# Patient Record
Sex: Female | Born: 1990 | Race: Black or African American | Hispanic: No | Marital: Married | Smoking: Never smoker
Health system: Southern US, Community
[De-identification: ages and names within clinical notes are randomized; demographics above are authoritative.]

## PROBLEM LIST (undated history)

## (undated) ENCOUNTER — Inpatient Hospital Stay (HOSPITAL_COMMUNITY): Payer: Self-pay

## (undated) DIAGNOSIS — R51 Headache: Secondary | ICD-10-CM

## (undated) DIAGNOSIS — F329 Major depressive disorder, single episode, unspecified: Secondary | ICD-10-CM

## (undated) DIAGNOSIS — F32A Depression, unspecified: Secondary | ICD-10-CM

## (undated) DIAGNOSIS — Z1612 Extended spectrum beta lactamase (ESBL) resistance: Secondary | ICD-10-CM

## (undated) HISTORY — PX: NO PAST SURGERIES: SHX2092

---

## 2011-05-10 ENCOUNTER — Encounter (HOSPITAL_COMMUNITY): Payer: Self-pay | Admitting: *Deleted

## 2011-05-10 ENCOUNTER — Inpatient Hospital Stay (HOSPITAL_COMMUNITY): Payer: Self-pay

## 2011-05-10 ENCOUNTER — Inpatient Hospital Stay (HOSPITAL_COMMUNITY)
Admission: AD | Admit: 2011-05-10 | Discharge: 2011-05-10 | Disposition: A | Discharge status: Home / Self Care | Payer: Self-pay | Source: Ambulatory Visit | Attending: Obstetrics and Gynecology | Admitting: Obstetrics and Gynecology

## 2011-05-10 DIAGNOSIS — Z1389 Encounter for screening for other disorder: Secondary | ICD-10-CM

## 2011-05-10 DIAGNOSIS — Z363 Encounter for antenatal screening for malformations: Secondary | ICD-10-CM | POA: Insufficient documentation

## 2011-05-10 DIAGNOSIS — O219 Vomiting of pregnancy, unspecified: Secondary | ICD-10-CM | POA: Insufficient documentation

## 2011-05-10 DIAGNOSIS — Z3490 Encounter for supervision of normal pregnancy, unspecified, unspecified trimester: Secondary | ICD-10-CM

## 2011-05-10 LAB — CBC
HCT: 32.3 % — ABNORMAL LOW (ref 36.0–46.0)
Hemoglobin: 10.2 g/dL — ABNORMAL LOW (ref 12.0–15.0)
MCH: 26.4 pg (ref 26.0–34.0)
MCHC: 31.6 g/dL (ref 30.0–36.0)

## 2011-05-10 LAB — URINALYSIS, ROUTINE W REFLEX MICROSCOPIC
Bilirubin Urine: NEGATIVE
Glucose, UA: NEGATIVE mg/dL
Hgb urine dipstick: NEGATIVE
Ketones, ur: 40 mg/dL — AB
Leukocytes, UA: NEGATIVE
pH: 6 (ref 5.0–8.0)

## 2011-05-10 LAB — HCG, QUANTITATIVE, PREGNANCY: hCG, Beta Chain, Quant, S: 105605 m[IU]/mL — ABNORMAL HIGH (ref <5)

## 2011-05-10 LAB — WET PREP, GENITAL
Trich, Wet Prep: NONE SEEN
Yeast Wet Prep HPF POC: NONE SEEN

## 2011-05-10 IMAGING — US US OB COMP LESS 14 WK
1 series · 14 of 19 positions shown · non-contrast
Comparison: none

[Series 1: us ob comp less 14 wks · 14 of 19 slices shown]
[im 1/19]
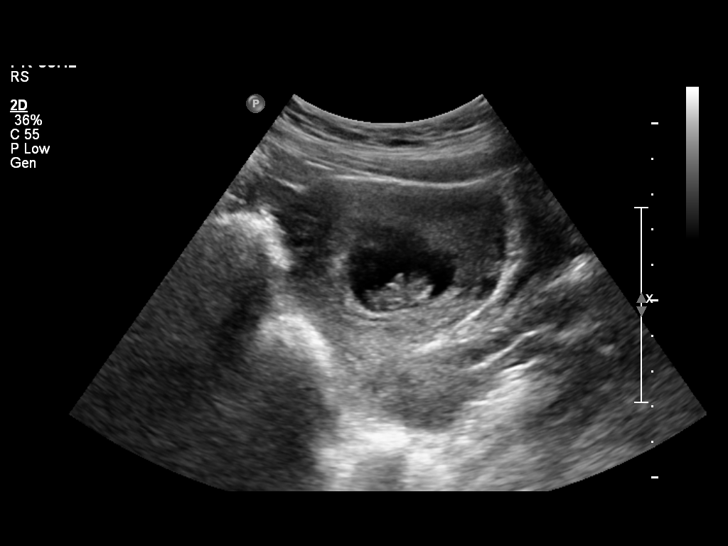
[im 3/19]
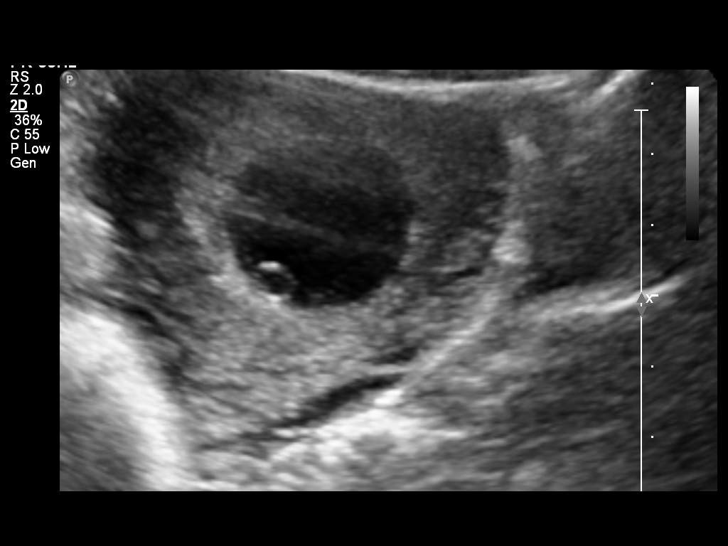
[im 4/19]
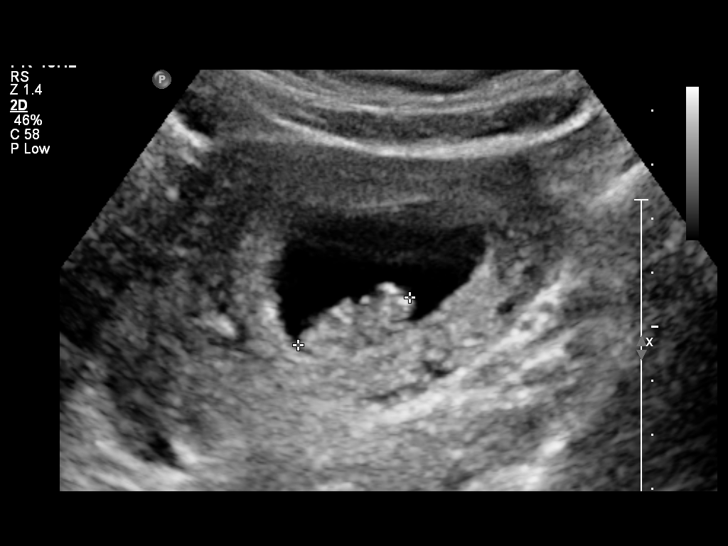
[im 5/19]
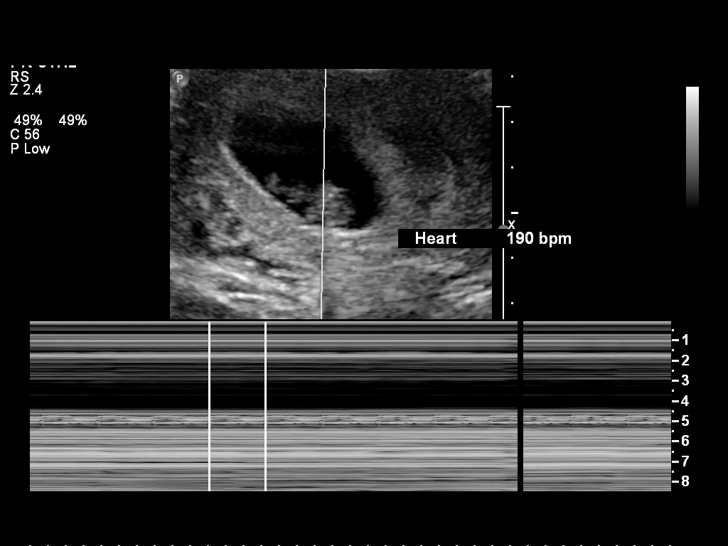
[im 7/19]
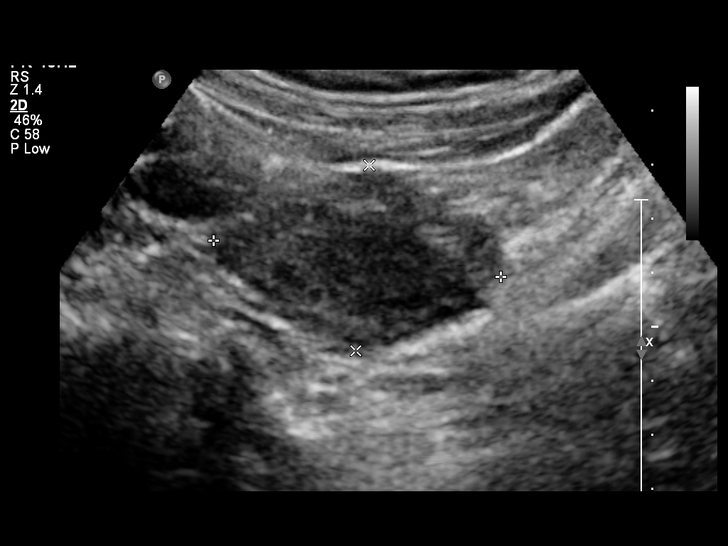
[im 8/19]
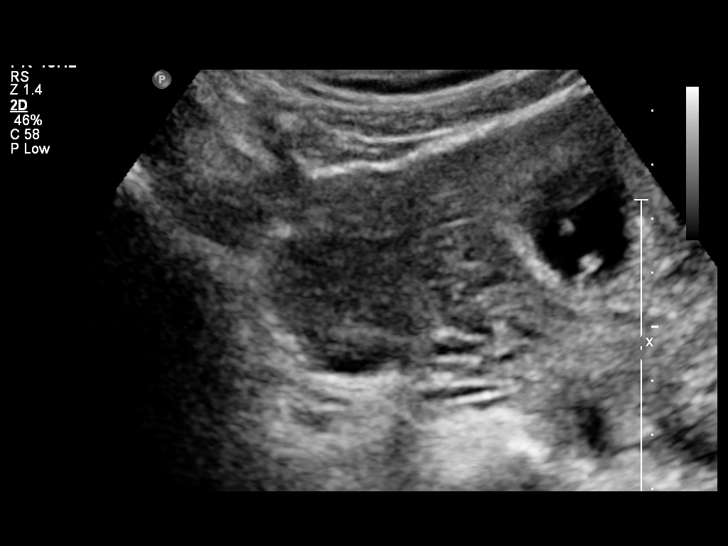
[im 9/19]
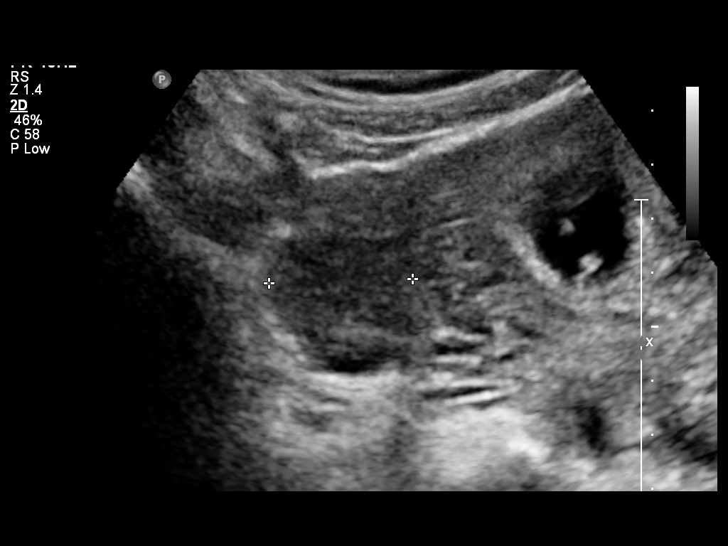
[im 11/19]
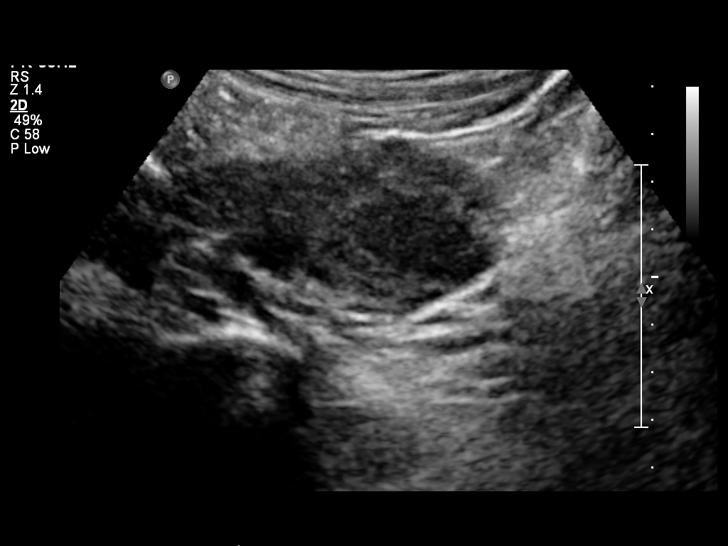
[im 12/19]
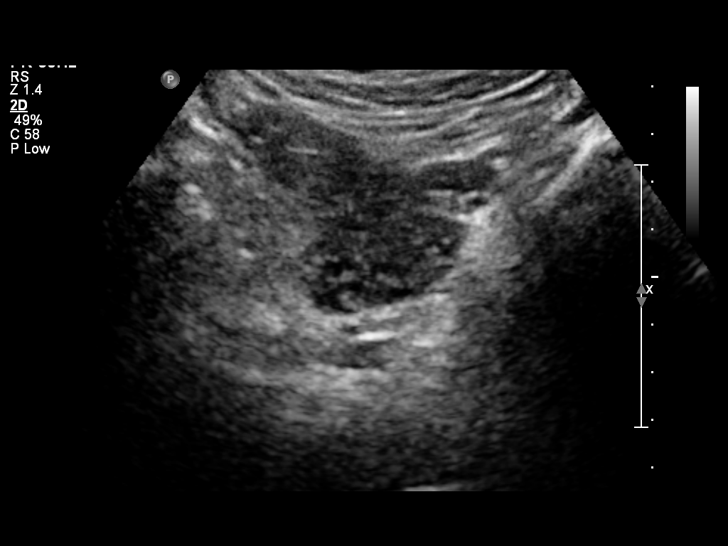
[im 13/19]
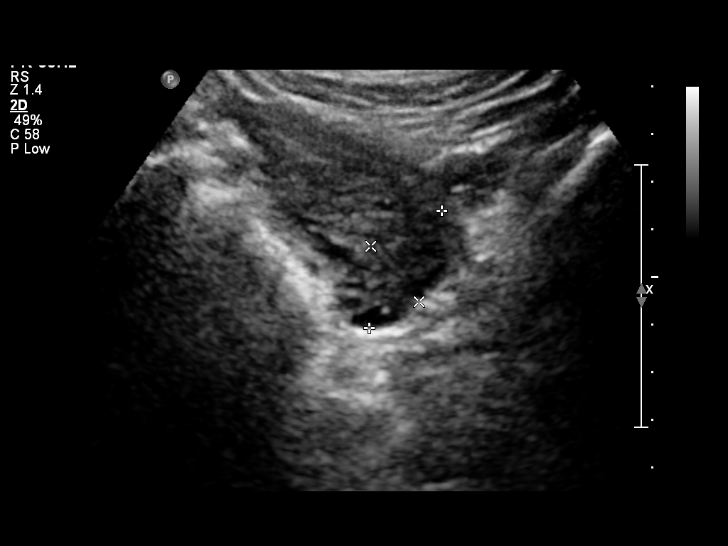
[im 15/19]
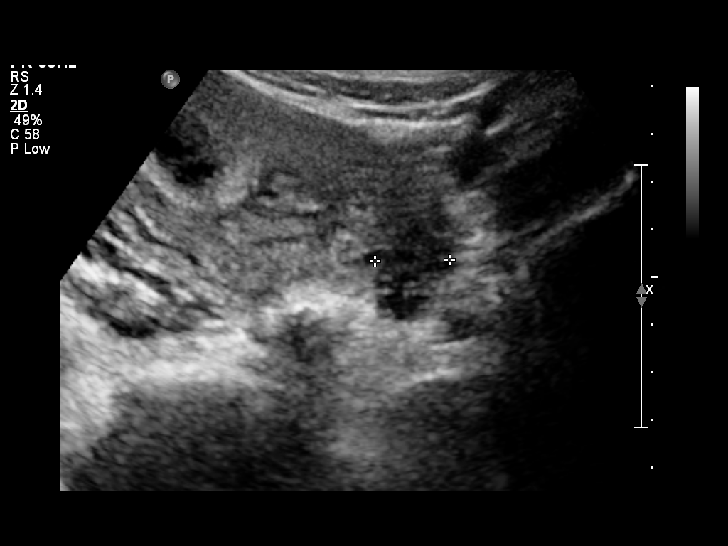
[im 16/19]
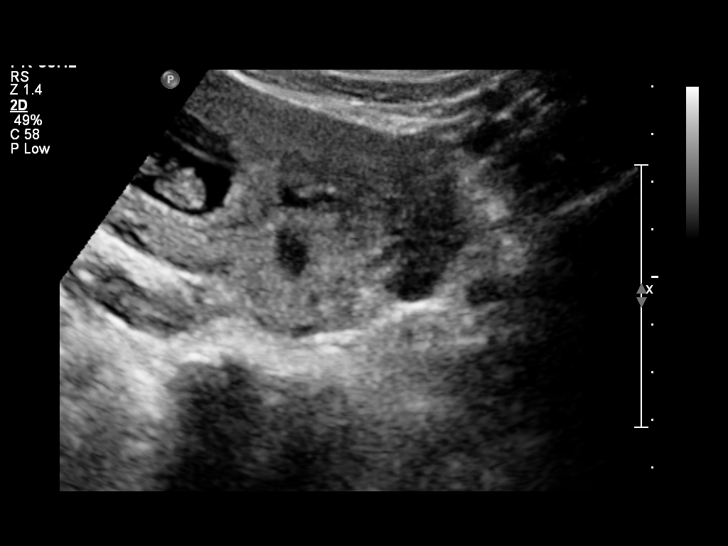
[im 17/19]
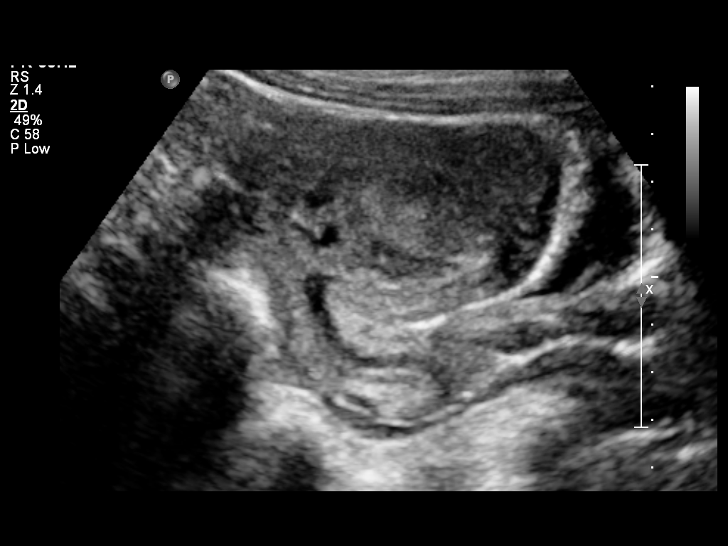
[im 19/19]
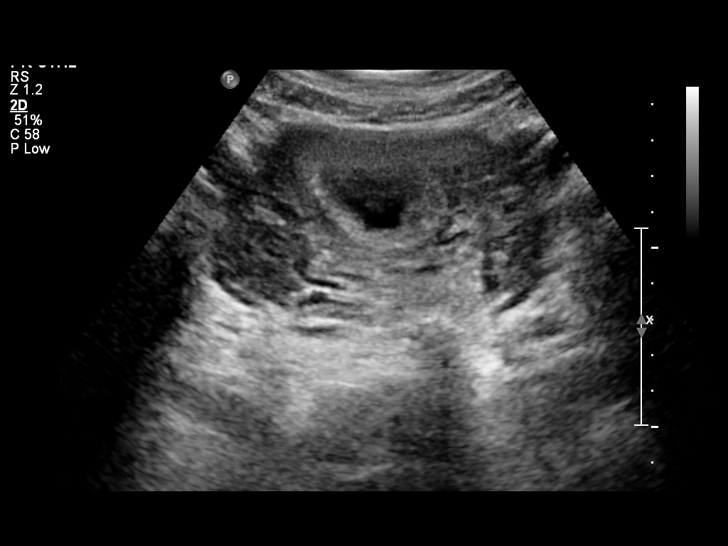

[14 of 19 positions shown; findings below may reference images not displayed]

OBSTETRICS REPORT
                      (Signed Final [DATE] [DATE])

Procedures

 US OB COMP LESS 14 WKS                                76801.0
Indications

 Pain - Abdominal/Pelvic
Fetal Evaluation

 Preg. Location:    Intrauterine
 Gest. Sac:         Intrauterine
 Yolk Sac:          Visualized
 Fetal Pole:        Visualized, intrauterine
 Fetal Heart Rate:  190                          bpm
 Cardiac Activity:  Observed

 Amniotic Fluid
 AFI FV:      Subjectively within normal limits
Biometry

 CRL:     22.3  mm     G. Age:  8w 5d                  EDD:    [DATE]
Gestational Age

 LMP:           8w 1d         Date:  [DATE]                 EDD:   [DATE]
 Best:          8w 1d      Det. By:  LMP  ([DATE])          EDD:   [DATE]
Cervix Uterus Adnexa

 Cervix:       Closed.
 Left Ovary:    Within normal limits.
 Right Ovary:   Within normal limits. Small corpus luteum noted.
 Adnexa:     No abnormality visualized.
Impression

 Intrauterine gestational sac, yolk sac, fetal pole, and cardiac
 activity noted.  Concordant GA by CRL and assigned GA by
 LMP.
 No acute abnormality.

## 2011-05-10 MED ORDER — ONDANSETRON HCL 4 MG/2ML IJ SOLN
4.0000 mg | Freq: Once | INTRAMUSCULAR | Status: AC
  Administered 2011-05-10: 4 mg via INTRAVENOUS
  Filled 2011-05-10: qty 2

## 2011-05-10 MED ORDER — DEXTROSE 5 % IN LACTATED RINGERS IV BOLUS
1000.0000 mL | Freq: Once | INTRAVENOUS | Status: AC
  Administered 2011-05-10: 1000 mL via INTRAVENOUS

## 2011-05-10 MED ORDER — ONDANSETRON HCL 4 MG PO TABS: 4.0000 mg | ORAL_TABLET | Freq: Three times a day (TID) | ORAL | Status: AC | PRN

## 2011-05-10 NOTE — ED Provider Notes (Signed)
History     Chief Complaint  Patient presents with  . Abdominal Pain   HPI 20 y.o.G1P0 at  Unknown EGA. C/O low abd pain x 1 month, worse for the last few days. No vaginal bleeding. + nausea and vomiting, unable to eat normally. Has also been fasting during the day, unable to eat at night when her fasting ends.     Past Medical History  Diagnosis Date  . No pertinent past medical history     Past Surgical History  Procedure Date  . No past surgeries     No family history on file.  History  Substance Use Topics  . Smoking status: Never Smoker   . Smokeless tobacco: Not on file  . Alcohol Use: No    Allergies:  Allergies  Allergen Reactions  . Fish Allergy Other (See Comments)    unknown    Prescriptions prior to admission  Medication Sig Dispense Refill  . Ibuprofen (ADVIL PO) Take 1 tablet by mouth daily.        . prenatal vitamin w/FE, FA (PRENATAL 1 + 1) 27-1 MG TABS Take 1 tablet by mouth daily.          Review of Systems  Constitutional: Negative.   HENT: Negative.   Eyes: Positive for pain.  Respiratory: Negative.   Cardiovascular: Negative.   Gastrointestinal: Positive for nausea, vomiting and abdominal pain.  Genitourinary: Negative.   Musculoskeletal: Negative.   Neurological: Negative.   Psychiatric/Behavioral: Negative.    Physical Exam   Blood pressure 114/75, pulse 99, resp. rate 18, height 5\' 4"  (1.626 m), weight 52.617 kg (116 lb), last menstrual period 03/14/2011.  Physical Exam  Nursing note and vitals reviewed. Constitutional: She is oriented to person, place, and time. She appears well-developed and well-nourished. No distress.  HENT:  Head: Normocephalic and atraumatic.  Cardiovascular: Normal rate.   Respiratory: Effort normal.  GI: Soft. Bowel sounds are normal. She exhibits no mass. There is no tenderness. There is no rebound and no guarding.  Genitourinary: There is no rash or lesion on the right labia. There is no rash or  lesion on the left labia. Uterus is not tender. Enlarged: Size c/w dates. Cervix exhibits no motion tenderness, no discharge and no friability. Right adnexum displays no mass, no tenderness and no fullness. Left adnexum displays no mass, no tenderness and no fullness. No tenderness or bleeding around the vagina. No vaginal discharge found.  Musculoskeletal: Normal range of motion.  Neurological: She is alert and oriented to person, place, and time.  Skin: Skin is warm and dry.  Psychiatric: She has a normal mood and affect.    MAU Course  Procedures  U/S shows 8.1 wk IUP, +fhr  Results for orders placed during the hospital encounter of 05/10/11 (from the past 24 hour(s))  URINALYSIS, ROUTINE W REFLEX MICROSCOPIC     Status: Abnormal   Collection Time   05/10/11  6:00 AM      Component Value Range   Color, Urine YELLOW  YELLOW    Appearance CLEAR  CLEAR    Specific Gravity, Urine >1.030 (*) 1.005 - 1.030    pH 6.0  5.0 - 8.0    Glucose, UA NEGATIVE  NEGATIVE (mg/dL)   Hgb urine dipstick NEGATIVE  NEGATIVE    Bilirubin Urine NEGATIVE  NEGATIVE    Ketones, ur 40 (*) NEGATIVE (mg/dL)   Protein, ur NEGATIVE  NEGATIVE (mg/dL)   Urobilinogen, UA 0.2  0.0 - 1.0 (mg/dL)  Nitrite NEGATIVE  NEGATIVE    Leukocytes, UA NEGATIVE  NEGATIVE   CBC     Status: Abnormal   Collection Time   05/10/11  6:50 AM      Component Value Range   WBC 7.2  4.0 - 10.5 (K/uL)   RBC 3.87  3.87 - 5.11 (MIL/uL)   Hemoglobin 10.2 (*) 12.0 - 15.0 (g/dL)   HCT 82.9 (*) 56.2 - 46.0 (%)   MCV 83.5  78.0 - 100.0 (fL)   MCH 26.4  26.0 - 34.0 (pg)   MCHC 31.6  30.0 - 36.0 (g/dL)   RDW 13.0  86.5 - 78.4 (%)   Platelets 197  150 - 400 (K/uL)  ABO/RH     Status: Normal   Collection Time   05/10/11  6:50 AM      Component Value Range   ABO/RH(D) A POS    HCG, QUANTITATIVE, PREGNANCY     Status: Abnormal   Collection Time   05/10/11  6:50 AM      Component Value Range   hCG, Beta Chain, Mahalia Longest [id number] (*) <5 (mIU/mL)   WET PREP, GENITAL     Status: Normal   Collection Time   05/10/11  7:05 AM      Component Value Range   Yeast, Wet Prep NONE SEEN  NONE SEEN    Trich, Wet Prep NONE SEEN  NONE SEEN    Clue Cells, Wet Prep NONE SEEN  NONE SEEN    WBC, Wet Prep HPF POC NONE SEEN  NONE SEEN       Assessment and Plan  1) 20 yo G1P0 @ 8.1 wk IUP - FU as scheduled tomorrow at [facility] 2) N/V of pregnancy - IV hydration and Zofran, recommend cessation of fasting or modified fasting for Ramadan, Rx Zofran 4mg  po prn nausea 3) Allergy Eyes: Recommend OTC Saline or Visine Allergy.      FRAZIER,NATALIE 05/10/2011, 6:49 AM

## 2011-05-10 NOTE — ED Provider Notes (Signed)
Pt back from [place], stable, NAD [place] results reviewed showing viable IUP at [redacted]w[redacted]d; EDD 12/311  Will give 1 liter LR with Zofran now Anticipate d/c home after IVF complete.

## 2011-05-10 NOTE — ED Notes (Signed)
Pt returned from [place]

## 2011-05-10 NOTE — Progress Notes (Signed)
Pt reports pain in lower abd x 1 month, was seen at health dept and told she was pregnant and she should come here to be seen. LMP 03/14/2011. Vomiting , nauseated all the time. Denies vaginal bleeding.

## 2011-05-21 ENCOUNTER — Encounter (HOSPITAL_COMMUNITY): Payer: Self-pay | Admitting: *Deleted

## 2011-05-21 ENCOUNTER — Inpatient Hospital Stay (HOSPITAL_COMMUNITY)
Admission: AD | Admit: 2011-05-21 | Discharge: 2011-05-21 | Disposition: A | Discharge status: Home / Self Care | Payer: Self-pay | Source: Ambulatory Visit | Attending: Obstetrics and Gynecology | Admitting: Obstetrics and Gynecology

## 2011-05-21 DIAGNOSIS — O2390 Unspecified genitourinary tract infection in pregnancy, unspecified trimester: Secondary | ICD-10-CM | POA: Insufficient documentation

## 2011-05-21 DIAGNOSIS — O210 Mild hyperemesis gravidarum: Secondary | ICD-10-CM | POA: Insufficient documentation

## 2011-05-21 DIAGNOSIS — N390 Urinary tract infection, site not specified: Secondary | ICD-10-CM | POA: Insufficient documentation

## 2011-05-21 DIAGNOSIS — O219 Vomiting of pregnancy, unspecified: Secondary | ICD-10-CM

## 2011-05-21 LAB — URINE MICROSCOPIC-ADD ON

## 2011-05-21 LAB — URINALYSIS, ROUTINE W REFLEX MICROSCOPIC
Glucose, UA: NEGATIVE mg/dL
Ketones, ur: NEGATIVE mg/dL
Protein, ur: 100 mg/dL — AB

## 2011-05-21 MED ORDER — CEPHALEXIN 500 MG PO CAPS: 500.0000 mg | ORAL_CAPSULE | Freq: Four times a day (QID) | ORAL | Status: AC

## 2011-05-21 MED ORDER — ONDANSETRON 4 MG PO TBDP: 4.0000 mg | ORAL_TABLET | Freq: Three times a day (TID) | ORAL | Status: AC | PRN

## 2011-05-21 NOTE — ED Notes (Signed)
Lab aware of urine culture.

## 2011-05-21 NOTE — Progress Notes (Signed)
Unable to get [employer] with [employer]. Husband will translate. Pt states she has been having bilateral eye pain and headache for a while. Started having nausea and vomiting 8-2.

## 2011-05-21 NOTE — ED Provider Notes (Signed)
S:   Catherine Gomez is a 20 y.o. G1P0 at [redacted]w[redacted]d presenting with intermittent bil eye pain and headaches since May. She has had the H/A and eye pain continuously  for the past 3 days since she ran out of Zofran, has been vomiting and has not been eating or drinking much. When she took the Zofran and was able to retain her food she had no H/A or eye pain. She denies photophobia, tearing, drainage or visual disturbance. No URI sx. She also reports some dysuria and low back pain for the past week. Denies frequency, urgency, hematuria, fever/chills. Waiting for [facility] to start [facility] at [facility]. She is from [place] and interpreter is her husband. Had a prior visit here for N/V and dehydration.  O: Filed Vitals:   05/21/11 1234  BP: 119/75  Pulse: 84  Temp: 97.6 F (36.4 C)  Resp: 16   Results for orders placed during the hospital encounter of 05/21/11 (from the past 24 hour(s))  URINALYSIS, ROUTINE W REFLEX MICROSCOPIC     Status: Abnormal   Collection Time   05/21/11 12:40 PM      Component Value Range   Color, Urine YELLOW  YELLOW    Appearance CLOUDY (*) CLEAR    Specific Gravity, Urine 1.025  1.005 - 1.030    pH 7.0  5.0 - 8.0    Glucose, UA NEGATIVE  NEGATIVE (mg/dL)   Hgb urine dipstick MODERATE (*) NEGATIVE    Bilirubin Urine NEGATIVE  NEGATIVE    Ketones, ur NEGATIVE  NEGATIVE (mg/dL)   Protein, ur 161 (*) NEGATIVE (mg/dL)   Urobilinogen, UA 0.2  0.0 - 1.0 (mg/dL)   Nitrite NEGATIVE  NEGATIVE    Leukocytes, UA LARGE (*) NEGATIVE   URINE MICROSCOPIC-ADD ON     Status: Abnormal   Collection Time   05/21/11 12:40 PM      Component Value Range   Squamous Epithelial / LPF MANY (*) RARE    WBC, UA TOO NUMEROUS TO COUNT  <3 (WBC/hpf)   RBC / HPF 7-10  <3 (RBC/hpf)   Bacteria, UA RARE  RARE    Urine-Other MUCOUS PRESENT     NAD.  Head: NT over maxillary sinuses Eyes: normal appearance, PERRLA, EOMs intact Back: no CVAT Abd: soft, NT; FHR 170 by DT  A/P: N/V of pregnancy with headache and  eye sx likely due to dehydration --treat with Zofran Presumptive UTI -- C&S sent and will tx with Keflex

## 2011-05-21 NOTE — ED Notes (Signed)
C/o headache and pain in her eyes.  No burning, no excessive tearing..  Repeats eyes hurt

## 2011-05-24 ENCOUNTER — Telehealth: Payer: Self-pay | Admitting: Family Medicine

## 2011-05-24 ENCOUNTER — Telehealth: Payer: Self-pay | Admitting: Obstetrics and Gynecology

## 2011-05-24 DIAGNOSIS — O2340 Unspecified infection of urinary tract in pregnancy, unspecified trimester: Secondary | ICD-10-CM

## 2011-05-24 LAB — URINE CULTURE

## 2011-05-24 NOTE — Telephone Encounter (Signed)
Called and left a message for patient.  Her UTI is not susceptible to Keflex.  Per discussion with Dr. Debroah Loop, she will need to be changed to [facility] and a single IM dose of gentamycin with a followup culture in ~2 weeks.

## 2011-05-26 NOTE — ED Provider Notes (Signed)
Agree with above note.  Catherine Gomez 05/26/2011 4:43 PM

## 2011-06-02 NOTE — Telephone Encounter (Signed)
Called pt. See Telephone note.

## 2011-07-14 LAB — RPR: RPR: NONREACTIVE

## 2011-07-14 LAB — HEPATITIS B SURFACE ANTIGEN: Hepatitis B Surface Ag: NEGATIVE

## 2011-10-19 NOTE — L&D Delivery Note (Signed)
Delivery Note At 4:25 AM a viable female was delivered via Vaginal, Vacuum (Extractor) (Presentation: ; Occiput Anterior).  APGAR: 8, 9; weight 6 lb 10.5 oz (3020 g).   Placenta status: Intact, Spontaneous.  Cord: 3 vessels with the following complications: None.  Cord pH: not done  Anesthesia: Epidural  Episiotomy: None Lacerations: None Suture Repair: 2.0 Est. Blood Loss (mL):   Mom to postpartum.  Baby to nursery-stable.  Maddalynn Barnard A 12/17/2011, 5:00 AM

## 2011-11-18 ENCOUNTER — Other Ambulatory Visit: Payer: Self-pay

## 2011-12-02 ENCOUNTER — Other Ambulatory Visit (HOSPITAL_COMMUNITY): Payer: Self-pay | Admitting: Obstetrics

## 2011-12-02 DIAGNOSIS — Z3689 Encounter for other specified antenatal screening: Secondary | ICD-10-CM

## 2011-12-02 DIAGNOSIS — O2690 Pregnancy related conditions, unspecified, unspecified trimester: Secondary | ICD-10-CM

## 2011-12-07 ENCOUNTER — Ambulatory Visit (HOSPITAL_COMMUNITY)
Admission: RE | Admit: 2011-12-07 | Discharge: 2011-12-07 | Disposition: A | Discharge status: Home / Self Care | Payer: Medicaid Other | Source: Ambulatory Visit | Attending: Obstetrics | Admitting: Obstetrics

## 2011-12-07 ENCOUNTER — Encounter (HOSPITAL_COMMUNITY): Payer: Self-pay

## 2011-12-07 DIAGNOSIS — Z1389 Encounter for screening for other disorder: Secondary | ICD-10-CM | POA: Insufficient documentation

## 2011-12-07 DIAGNOSIS — Z3689 Encounter for other specified antenatal screening: Secondary | ICD-10-CM

## 2011-12-07 DIAGNOSIS — O358XX0 Maternal care for other (suspected) fetal abnormality and damage, not applicable or unspecified: Secondary | ICD-10-CM | POA: Insufficient documentation

## 2011-12-07 DIAGNOSIS — Z363 Encounter for antenatal screening for malformations: Secondary | ICD-10-CM | POA: Insufficient documentation

## 2011-12-07 DIAGNOSIS — O2690 Pregnancy related conditions, unspecified, unspecified trimester: Secondary | ICD-10-CM

## 2011-12-16 ENCOUNTER — Encounter (HOSPITAL_COMMUNITY): Payer: Self-pay | Admitting: Anesthesiology

## 2011-12-16 ENCOUNTER — Encounter (HOSPITAL_COMMUNITY): Payer: Self-pay | Admitting: *Deleted

## 2011-12-16 ENCOUNTER — Inpatient Hospital Stay (HOSPITAL_COMMUNITY): Payer: Medicaid Other | Admitting: Anesthesiology

## 2011-12-16 ENCOUNTER — Inpatient Hospital Stay (HOSPITAL_COMMUNITY)
Admission: AD | Admit: 2011-12-16 | Discharge: 2011-12-19 | DRG: 774 | Disposition: A | Discharge status: Home Health | Payer: Medicaid Other | Source: Ambulatory Visit | Attending: Obstetrics | Admitting: Obstetrics

## 2011-12-16 DIAGNOSIS — D649 Anemia, unspecified: Secondary | ICD-10-CM | POA: Diagnosis not present

## 2011-12-16 DIAGNOSIS — IMO0002 Reserved for concepts with insufficient information to code with codable children: Secondary | ICD-10-CM | POA: Diagnosis present

## 2011-12-16 DIAGNOSIS — O9903 Anemia complicating the puerperium: Secondary | ICD-10-CM | POA: Diagnosis not present

## 2011-12-16 DIAGNOSIS — O1410 Severe pre-eclampsia, unspecified trimester: Secondary | ICD-10-CM | POA: Clinically undetermined

## 2011-12-16 LAB — COMPREHENSIVE METABOLIC PANEL
ALT: 8 U/L (ref 0–35)
BUN: 8 mg/dL (ref 6–23)
CO2: 25 mEq/L (ref 19–32)
Calcium: 8.9 mg/dL (ref 8.4–10.5)
Creatinine, Ser: 0.67 mg/dL (ref 0.50–1.10)
GFR calc Af Amer: >90 mL/min (ref >90)
GFR calc non Af Amer: >90 mL/min (ref >90)
Glucose, Bld: 68 mg/dL — ABNORMAL LOW (ref 70–99)
Sodium: 136 mEq/L (ref 135–145)
Total Protein: 6.1 g/dL (ref 6.0–8.3)

## 2011-12-16 LAB — RPR: RPR Ser Ql: NONREACTIVE

## 2011-12-16 LAB — CBC
Hemoglobin: 9.6 g/dL — ABNORMAL LOW (ref 12.0–15.0)
Hemoglobin: 9.3 g/dL — ABNORMAL LOW (ref 12.0–15.0)
MCH: 25.3 pg — ABNORMAL LOW (ref 26.0–34.0)
MCH: 25.8 pg — ABNORMAL LOW (ref 26.0–34.0)
MCHC: 31.1 g/dL (ref 30.0–36.0)
MCV: 81.5 fL (ref 78.0–100.0)
Platelets: 91 10*3/uL — ABNORMAL LOW (ref 150–400)
RBC: 3.61 MIL/uL — ABNORMAL LOW (ref 3.87–5.11)

## 2011-12-16 LAB — URIC ACID: Uric Acid, Serum: 6.5 mg/dL (ref 2.4–7.0)

## 2011-12-16 MED ORDER — TERBUTALINE SULFATE 1 MG/ML IJ SOLN: 0.2500 mg | Freq: Once | INTRAMUSCULAR | Status: AC | PRN

## 2011-12-16 MED ORDER — LIDOCAINE HCL (PF) 1 % IJ SOLN
30.0000 mL | INTRAMUSCULAR | Status: DC | PRN
  Filled 2011-12-16: qty 30

## 2011-12-16 MED ORDER — LACTATED RINGERS IV SOLN
500.0000 mL | INTRAVENOUS | Status: DC | PRN
  Administered 2011-12-16: 300 mL via INTRAVENOUS
  Administered 2011-12-16: 500 mL via INTRAVENOUS

## 2011-12-16 MED ORDER — LIDOCAINE HCL 1.5 % IJ SOLN
INTRAMUSCULAR | Status: DC | PRN
  Administered 2011-12-16 (×2): 5 mL via EPIDURAL

## 2011-12-16 MED ORDER — BUTORPHANOL TARTRATE 2 MG/ML IJ SOLN
1.0000 mg | INTRAMUSCULAR | Status: DC | PRN
  Administered 2011-12-16: 1 mg via INTRAVENOUS
  Filled 2011-12-16: qty 1

## 2011-12-16 MED ORDER — FLEET ENEMA 7-19 GM/118ML RE ENEM: 1.0000 | ENEMA | RECTAL | Status: DC | PRN

## 2011-12-16 MED ORDER — MAGNESIUM SULFATE BOLUS VIA INFUSION
4.0000 g | Freq: Once | INTRAVENOUS | Status: DC
  Filled 2011-12-16: qty 500

## 2011-12-16 MED ORDER — LACTATED RINGERS IV SOLN
INTRAVENOUS | Status: DC
  Administered 2011-12-16 (×3): via INTRAVENOUS

## 2011-12-16 MED ORDER — ONDANSETRON HCL 4 MG/2ML IJ SOLN
4.0000 mg | Freq: Four times a day (QID) | INTRAMUSCULAR | Status: DC | PRN
  Administered 2011-12-16: 4 mg via INTRAVENOUS
  Filled 2011-12-16: qty 2

## 2011-12-16 MED ORDER — PHENYLEPHRINE 40 MCG/ML (10ML) SYRINGE FOR IV PUSH (FOR BLOOD PRESSURE SUPPORT): 80.0000 ug | PREFILLED_SYRINGE | INTRAVENOUS | Status: DC | PRN

## 2011-12-16 MED ORDER — MAGNESIUM SULFATE 40 G IN LACTATED RINGERS - SIMPLE
2.0000 g/h | INTRAVENOUS | Status: DC
  Administered 2011-12-16: 4 g/h via INTRAVENOUS
  Administered 2011-12-17: 2 g/h via INTRAVENOUS
  Filled 2011-12-16 (×2): qty 500

## 2011-12-16 MED ORDER — LACTATED RINGERS IV SOLN: 500.0000 mL | Freq: Once | INTRAVENOUS | Status: DC

## 2011-12-16 MED ORDER — OXYTOCIN 20 UNITS IN LACTATED RINGERS INFUSION - SIMPLE
1.0000 m[IU]/min | INTRAVENOUS | Status: DC
  Administered 2011-12-16: 1 m[IU]/min via INTRAVENOUS
  Filled 2011-12-16: qty 1000

## 2011-12-16 MED ORDER — ACETAMINOPHEN 325 MG PO TABS
650.0000 mg | ORAL_TABLET | ORAL | Status: DC | PRN
  Administered 2011-12-16: 650 mg via ORAL
  Filled 2011-12-16: qty 2

## 2011-12-16 MED ORDER — EPHEDRINE 5 MG/ML INJ
10.0000 mg | INTRAVENOUS | Status: DC | PRN
  Filled 2011-12-16: qty 4

## 2011-12-16 MED ORDER — CITRIC ACID-SODIUM CITRATE 334-500 MG/5ML PO SOLN
30.0000 mL | ORAL | Status: DC | PRN
  Administered 2011-12-16: 30 mL via ORAL
  Filled 2011-12-16: qty 15

## 2011-12-16 MED ORDER — IBUPROFEN 600 MG PO TABS
600.0000 mg | ORAL_TABLET | Freq: Four times a day (QID) | ORAL | Status: DC | PRN
  Administered 2011-12-17: 600 mg via ORAL

## 2011-12-16 MED ORDER — DIPHENHYDRAMINE HCL 50 MG/ML IJ SOLN: 12.5000 mg | INTRAMUSCULAR | Status: DC | PRN

## 2011-12-16 MED ORDER — EPHEDRINE 5 MG/ML INJ: 10.0000 mg | INTRAVENOUS | Status: DC | PRN

## 2011-12-16 MED ORDER — OXYTOCIN 20 UNITS IN LACTATED RINGERS INFUSION - SIMPLE: 125.0000 mL/h | Freq: Once | INTRAVENOUS | Status: DC

## 2011-12-16 MED ORDER — OXYTOCIN BOLUS FROM INFUSION
500.0000 mL | Freq: Once | INTRAVENOUS | Status: AC
  Administered 2011-12-17: 500 mL via INTRAVENOUS
  Filled 2011-12-16: qty 500

## 2011-12-16 MED ORDER — FENTANYL 2.5 MCG/ML BUPIVACAINE 1/10 % EPIDURAL INFUSION (WH - ANES)
INTRAMUSCULAR | Status: DC | PRN
  Administered 2011-12-16: 14 mL/h via EPIDURAL

## 2011-12-16 MED ORDER — FENTANYL 2.5 MCG/ML BUPIVACAINE 1/10 % EPIDURAL INFUSION (WH - ANES)
14.0000 mL/h | INTRAMUSCULAR | Status: DC
Start: 2011-12-16 — End: 2011-12-17
  Administered 2011-12-16 – 2011-12-17 (×2): 14 mL/h via EPIDURAL
  Filled 2011-12-16 (×3): qty 60

## 2011-12-16 MED ORDER — PHENYLEPHRINE 40 MCG/ML (10ML) SYRINGE FOR IV PUSH (FOR BLOOD PRESSURE SUPPORT)
80.0000 ug | PREFILLED_SYRINGE | INTRAVENOUS | Status: DC | PRN
  Filled 2011-12-16: qty 5

## 2011-12-16 MED ORDER — OXYCODONE-ACETAMINOPHEN 5-325 MG PO TABS
1.0000 | ORAL_TABLET | ORAL | Status: DC | PRN
  Administered 2011-12-19: 2 via ORAL
  Filled 2011-12-16 (×2): qty 1
  Filled 2011-12-16: qty 2
  Filled 2011-12-16: qty 1

## 2011-12-16 NOTE — Anesthesia Procedure Notes (Signed)
Epidural Patient location during procedure: OB Start time: 12/16/2011 7:08 PM End time: 12/16/2011 7:12 PM Reason for block: procedure for pain  Staffing Anesthesiologist: Sandrea Hughs Performed by: anesthesiologist   Preanesthetic Checklist Completed: patient identified, site marked, surgical consent, pre-op evaluation, timeout performed, IV checked, risks and benefits discussed and monitors and equipment checked  Epidural Patient position: sitting Prep: site prepped and draped and DuraPrep Patient monitoring: continuous pulse ox and blood pressure Approach: midline Injection technique: LOR air  Needle:  Needle type: Tuohy  Needle gauge: 17 G Needle length: 9 cm Needle insertion depth: 4 cm Catheter type: closed end flexible Catheter size: 19 Gauge Catheter at skin depth: 9 cm Test dose: negative and 1.5% lidocaine  Assessment Sensory level: T8 Events: blood not aspirated, injection not painful, no injection resistance, negative IV test and no paresthesia

## 2011-12-16 NOTE — Anesthesia Preprocedure Evaluation (Signed)

## 2011-12-17 ENCOUNTER — Encounter (HOSPITAL_COMMUNITY): Payer: Self-pay | Admitting: *Deleted

## 2011-12-17 LAB — MRSA PCR SCREENING: MRSA by PCR: NEGATIVE

## 2011-12-17 LAB — COMPREHENSIVE METABOLIC PANEL
ALT: 7 U/L (ref 0–35)
AST: 17 U/L (ref 0–37)
Alkaline Phosphatase: 200 U/L — ABNORMAL HIGH (ref 39–117)
CO2: 25 mEq/L (ref 19–32)
Calcium: 8.4 mg/dL (ref 8.4–10.5)
GFR calc non Af Amer: >90 mL/min (ref >90)
Glucose, Bld: 116 mg/dL — ABNORMAL HIGH (ref 70–99)
Potassium: 3.9 mEq/L (ref 3.5–5.1)
Sodium: 136 mEq/L (ref 135–145)
Total Protein: 5.7 g/dL — ABNORMAL LOW (ref 6.0–8.3)

## 2011-12-17 LAB — CBC
HCT: 30.4 % — ABNORMAL LOW (ref 36.0–46.0)
Hemoglobin: 9.5 g/dL — ABNORMAL LOW (ref 12.0–15.0)
MCH: 25.9 pg — ABNORMAL LOW (ref 26.0–34.0)
MCHC: 31.3 g/dL (ref 30.0–36.0)
RDW: 14.7 % (ref 11.5–15.5)

## 2011-12-17 MED ORDER — PRENATAL MULTIVITAMIN CH
1.0000 | ORAL_TABLET | Freq: Every day | ORAL | Status: DC
  Administered 2011-12-17 – 2011-12-19 (×3): 1 via ORAL
  Filled 2011-12-17 (×3): qty 1

## 2011-12-17 MED ORDER — SIMETHICONE 80 MG PO CHEW: 80.0000 mg | CHEWABLE_TABLET | ORAL | Status: DC | PRN

## 2011-12-17 MED ORDER — LACTATED RINGERS IV SOLN
INTRAVENOUS | Status: DC
  Administered 2011-12-17: 01:00:00 via INTRAUTERINE

## 2011-12-17 MED ORDER — FERROUS SULFATE 325 (65 FE) MG PO TABS
325.0000 mg | ORAL_TABLET | Freq: Two times a day (BID) | ORAL | Status: DC
  Administered 2011-12-17 – 2011-12-19 (×5): 325 mg via ORAL
  Filled 2011-12-17 (×5): qty 1

## 2011-12-17 MED ORDER — DIPHENHYDRAMINE HCL 25 MG PO CAPS: 25.0000 mg | ORAL_CAPSULE | Freq: Four times a day (QID) | ORAL | Status: DC | PRN

## 2011-12-17 MED ORDER — SENNOSIDES-DOCUSATE SODIUM 8.6-50 MG PO TABS
2.0000 | ORAL_TABLET | Freq: Every day | ORAL | Status: DC
  Administered 2011-12-17 – 2011-12-18 (×2): 2 via ORAL

## 2011-12-17 MED ORDER — DIBUCAINE 1 % RE OINT
1.0000 "application " | TOPICAL_OINTMENT | RECTAL | Status: DC | PRN
  Filled 2011-12-17: qty 28

## 2011-12-17 MED ORDER — OXYCODONE-ACETAMINOPHEN 5-325 MG PO TABS
1.0000 | ORAL_TABLET | ORAL | Status: DC | PRN
  Administered 2011-12-17 – 2011-12-18 (×3): 1 via ORAL

## 2011-12-17 MED ORDER — ONDANSETRON HCL 4 MG/2ML IJ SOLN: 4.0000 mg | INTRAMUSCULAR | Status: DC | PRN

## 2011-12-17 MED ORDER — LANOLIN HYDROUS EX OINT: TOPICAL_OINTMENT | CUTANEOUS | Status: DC | PRN

## 2011-12-17 MED ORDER — BENZOCAINE-MENTHOL 20-0.5 % EX AERO
1.0000 "application " | INHALATION_SPRAY | CUTANEOUS | Status: DC | PRN
  Filled 2011-12-17: qty 56

## 2011-12-17 MED ORDER — WITCH HAZEL-GLYCERIN EX PADS: 1.0000 "application " | MEDICATED_PAD | CUTANEOUS | Status: DC | PRN

## 2011-12-17 MED ORDER — IBUPROFEN 600 MG PO TABS
600.0000 mg | ORAL_TABLET | Freq: Four times a day (QID) | ORAL | Status: DC
  Administered 2011-12-17 – 2011-12-19 (×8): 600 mg via ORAL
  Filled 2011-12-17 (×9): qty 1

## 2011-12-17 MED ORDER — LACTATED RINGERS IV SOLN
INTRAVENOUS | Status: DC
  Administered 2011-12-17 – 2011-12-18 (×3): via INTRAVENOUS

## 2011-12-17 MED ORDER — ZOLPIDEM TARTRATE 5 MG PO TABS: 5.0000 mg | ORAL_TABLET | Freq: Every evening | ORAL | Status: DC | PRN

## 2011-12-17 MED ORDER — ONDANSETRON HCL 4 MG PO TABS: 4.0000 mg | ORAL_TABLET | ORAL | Status: DC | PRN

## 2011-12-17 MED ORDER — TETANUS-DIPHTH-ACELL PERTUSSIS 5-2.5-18.5 LF-MCG/0.5 IM SUSP
0.5000 mL | Freq: Once | INTRAMUSCULAR | Status: DC
  Filled 2011-12-17: qty 0.5

## 2011-12-17 NOTE — H&P (Signed)
And this is Dr. Francoise Ceo dictating the history and physical on  Catherine Gomez she's a 21 year old gravida 1 at 56 weeks and 5 days who was seen in the office on 228 at that time her blood pressure was 140/90 should've 5 pound weight gain and 4+ proteinuria cervix was 1 cm and she was admitted for delivery because of PIH at a time of admission her platelets were 99 down from 131 she was started on Pitocin and him and 6 hours after admission her platelets were 91 showed dry epidural placed her liver enzymes were normal and her highest diastolic was 100 membranes were ruptured artificially and the fluid was clear Past medical history negative Past surgical history negative Social history noncontributory Family history negative System review negative Physical exam well-developed female in no distress HEENT negative Heart regular rhythm no murmurs no gallops Breasts negative Abdomen term Pelvic as described above Extremities negative and

## 2011-12-17 NOTE — Progress Notes (Signed)
UR Chart review completed.  

## 2011-12-18 DIAGNOSIS — O1410 Severe pre-eclampsia, unspecified trimester: Secondary | ICD-10-CM | POA: Clinically undetermined

## 2011-12-18 LAB — CBC
MCHC: 31.7 g/dL (ref 30.0–36.0)
Platelets: 95 10*3/uL — ABNORMAL LOW (ref 150–400)
RDW: 14.9 % (ref 11.5–15.5)
WBC: 14.8 10*3/uL — ABNORMAL HIGH (ref 4.0–10.5)

## 2011-12-18 MED ORDER — ALUM & MAG HYDROXIDE-SIMETH 200-200-20 MG/5ML PO SUSP
30.0000 mL | Freq: Four times a day (QID) | ORAL | Status: DC | PRN
  Administered 2011-12-18 – 2011-12-19 (×3): 30 mL via ORAL
  Filled 2011-12-18 (×3): qty 30

## 2011-12-18 MED ORDER — SODIUM CHLORIDE 0.9 % IJ SOLN
3.0000 mL | Freq: Two times a day (BID) | INTRAMUSCULAR | Status: DC
  Administered 2011-12-18 (×2): 3 mL via INTRAVENOUS

## 2011-12-18 MED ORDER — SODIUM CHLORIDE 0.9 % IJ SOLN: 3.0000 mL | INTRAMUSCULAR | Status: DC | PRN

## 2011-12-18 NOTE — Progress Notes (Signed)
Patient ID: Catherine Gomez, female   DOB: 26-Oct-1990, 21 y.o.   MRN: [id number] Post Partum Day 1 S/P spontaneous vaginal RH status/Rubella reviewed.  Feeding: breast and bottle Subjective: No HA, SOB, CP, F/C, breast symptoms. Normal vaginal bleeding, no clots.     Objective: BP 140/95  Pulse 98  Temp(Src) 97.8 F (36.6 C) (Oral)  Resp 18  Ht 5\' 4"  (1.626 m)  Wt 63.957 kg (141 lb)  BMI 24.20 kg/m2  SpO2 100%  LMP 03/14/2011  Breastfeeding? Unknown  Intake/Output Summary (Last 24 hours) at 12/18/11 1144 Last data filed at 12/18/11 1100  Gross per 24 hour  Intake 4524.59 ml  Output   4550 ml  Net -25.41 ml    Physical Exam:  General: alert Lochia: appropriate Uterine Fundus: firm DVT Evaluation: No evidence of DVT seen on physical exam. Ext: No c/c/e  Basename 12/18/11 0558 12/17/11 0515  HGB 7.1* 9.5*  HCT 22.7* 30.4*      Assessment/Plan: 21 y.o.  PPD #1 .  normal postpartum exam Preeclampsia stable, diuresing Anemia  Continue current postpartum care D/C Mg Ambulate   LOS: 2 days   JACKSON-MOORE,Crystina Borrayo A 12/18/2011, 11:43 AM

## 2011-12-19 ENCOUNTER — Inpatient Hospital Stay (HOSPITAL_COMMUNITY): Payer: Medicaid Other

## 2011-12-19 ENCOUNTER — Other Ambulatory Visit: Payer: Self-pay

## 2011-12-19 LAB — TROPONIN I: Troponin I: <0.3 ng/mL (ref <0.30)

## 2011-12-19 MED ORDER — OXYCODONE-ACETAMINOPHEN 5-325 MG PO TABS: 1.0000 | ORAL_TABLET | ORAL | Status: AC | PRN

## 2011-12-19 MED ORDER — PRENATAL MULTIVITAMIN CH: 1.0000 | ORAL_TABLET | Freq: Every day | ORAL | Status: DC

## 2011-12-19 NOTE — Progress Notes (Signed)
Pt states she was asleep, woke up to BR with "heaviness in eyes", blurred vision. When she got back in bed, she experienced tightness in chest. Like "someone is pushing on me". Pt is calm. Asked to take deep breaths with success. Color pink, VSS with exception of elevated blood pressure. Pain also migrates to throat and left shoulder.

## 2011-12-19 NOTE — Significant Event (Signed)
Rapid Response Event Note  Overview:      Initial Focused Assessment:   Interventions:   Event Summary:   at      at      [facility] to get follow up on pt.-discharged home earlier today.    Tia Alert A

## 2011-12-19 NOTE — Progress Notes (Signed)
Called to evaluate pt due to elevated BP's; c/o dizziness that has now resolved; "burning to eyes"; pain to midsternum radiating to throat described as "somebody pushing on me" and pain to rt shoulder. Pt had similar pain to chest and throat earlier that had been temporarily relieved by Maalox. Pt resting on stretcher, respirations even and unlabored, no obvious distress noted, BP 163 / 100 HR 80's; DTR's 1-2+; no clonus; Pt's primary nurse paging Dr Tamela Oddi.  63 Dr Tamela Oddi advised of BP's and assessment by primary RN and ordered a chest x-ray and Maalox. Pt remains resting on stretcher in no obvious distress.

## 2011-12-19 NOTE — Progress Notes (Signed)
Post Partum Day #2 S/P:spontaneous vaginal  RH status/Rubella reviewed.  Feeding: breast Subjective: C/O ?substernal C/P--? Episodic.  No anxiety symptoms, palpitations.  No HA, F/C, breast symptoms: no. Normal vaginal bleeding, no clots.     Objective:  Blood pressure 131/91, pulse 98, temperature 98.1 F (36.7 C), temperature source Oral, resp. rate 18, height 5\' 4"  (1.626 m), weight 63.957 kg (141 lb), last menstrual period 03/14/2011, SpO2 100.00%.   Physical Exam:  General: alert Lochia: appropriate Uterine Fundus: firm DVT Evaluation: No evidence of DVT seen on physical exam. Ext: No c/c/e  Basename 12/18/11 0558 12/17/11 0515  HGB 7.1* 9.5*  HCT 22.7* 30.4*   Dg Chest 2 View  12/19/2011  *RADIOLOGY REPORT*  Clinical Data: Recent vaginal delivery; chest pain.  CHEST - 2 VIEW  Comparison: None.  Findings: The lungs are well-aerated and clear.  There is no evidence of focal opacification, pleural effusion or pneumothorax.  The heart is normal in size; the mediastinal contour is within normal limits.  No acute osseous abnormalities are seen.  IMPRESSION: No acute cardiopulmonary process seen.  Original Report Authenticated By: Tonia Ghent, M.D.   Assessment/Plan: 21 y.o.  PPD # 2 .  ?Etiology of complaints.  History limited by language barrier.  ?Similar episodes during pregnancy. PIH--BPs OK when asymptomatic Declines contraceptive method EKG now  Anticipate D/C home today   LOS: 3 days   JACKSON-MOORE,Muslima Toppins A 12/19/2011, 10:58 AM

## 2011-12-19 NOTE — Progress Notes (Signed)
Rapid Response RN called at bedside for evaluation.

## 2011-12-19 NOTE — Progress Notes (Signed)
MD made aware of chest Xray results and current vital signs. Will continue to monitor.

## 2011-12-19 NOTE — Progress Notes (Signed)
Pt to radiology for chest xray

## 2011-12-19 NOTE — Discharge Summary (Signed)
Obstetric Discharge Summary Reason for Admission: induction of labor and PIH Prenatal Procedures: none Intrapartum Procedures: spontaneous vaginal delivery Postpartum Procedures: none Complications-Operative and Postpartum: none Hemoglobin  Date Value Range Status  12/18/2011 7.1* 12.0-15.0 (g/dL) Final     DELTA CHECK NOTED     REPEATED TO VERIFY     HCT  Date Value Range Status  12/18/2011 22.7* 36.0-46.0 (%) Final    Discharge Diagnoses: Term Pregnancy-delivered and Preelampsia; ?Anxiety/Panic attacks  Discharge Information: Date: 12/19/2011 Activity: pelvic rest Diet: routine Medications: PNV, Ibuprofen and Percocet Condition: stable Instructions: see above Discharge to: home Follow-up Information    Follow up with MARSHALL,BERNARD A, MD. Call in 1 day.   Contact information:   [address] [place] [address] [phone]          Newborn Data: Live born female  Birth Weight: 6 lb 10.5 oz (3020 g) APGAR: 8, 9  Home with mother.  Catherine Gomez,Catherine Gomez 12/19/2011, 11:13 AM

## 2011-12-19 NOTE — Progress Notes (Signed)
MD made aware of pt status. New order given.

## 2011-12-27 NOTE — Anesthesia Postprocedure Evaluation (Signed)
Anesthesia Post Note  Patient: Catherine Gomez  Procedure(s) Performed: * No procedures listed *  Anesthesia type: Epidural  Patient location: Mother/Baby  Post pain: Pain level controlled  Post assessment: Post-op Vital signs reviewed  Last Vitals: There were no vitals filed for this visit.  Post vital signs: Reviewed  Level of consciousness: awake  Complications: No apparent anesthesia complications

## 2012-10-18 NOTE — L&D Delivery Note (Signed)
Delivery Note At 6:11 PM a viable female was delivered via Vaginal, Spontaneous Delivery (Presentation: ;  ).  APGAR: 8, 9; weight .   Placenta status: Intact, Spontaneous.  Cord: 3 vessels with the following complications: None.  Cord pH: not done  Anesthesia: None  Episiotomy: None Lacerations: None Suture Repair: 2.0 vicryl Est. Blood Loss (mL): 250  Mom to postpartum.  Baby to nursery-stable.  MARSHALL,BERNARD A 06/25/2013, 6:36 PM

## 2012-12-11 ENCOUNTER — Emergency Department (HOSPITAL_COMMUNITY)
Admission: EM | Admit: 2012-12-11 | Discharge: 2012-12-11 | Discharge status: Against Medical Advice | Payer: Self-pay | Attending: Emergency Medicine | Admitting: Emergency Medicine

## 2012-12-11 ENCOUNTER — Emergency Department (HOSPITAL_COMMUNITY)
Admission: EM | Admit: 2012-12-11 | Discharge: 2012-12-11 | Disposition: A | Discharge status: Home / Self Care | Payer: BC Managed Care – PPO | Attending: Emergency Medicine | Admitting: Emergency Medicine

## 2012-12-11 DIAGNOSIS — R51 Headache: Secondary | ICD-10-CM | POA: Insufficient documentation

## 2012-12-11 DIAGNOSIS — O9989 Other specified diseases and conditions complicating pregnancy, childbirth and the puerperium: Secondary | ICD-10-CM | POA: Insufficient documentation

## 2012-12-11 DIAGNOSIS — Z79899 Other long term (current) drug therapy: Secondary | ICD-10-CM | POA: Insufficient documentation

## 2012-12-11 LAB — PREGNANCY, URINE: Preg Test, Ur: POSITIVE — AB

## 2012-12-11 MED ORDER — ACETAMINOPHEN 500 MG PO TABS: 500.0000 mg | ORAL_TABLET | Freq: Four times a day (QID) | ORAL | Status: DC | PRN

## 2012-12-11 NOTE — ED Provider Notes (Signed)
History     CSN: [id number]  Arrival date & time 12/11/12  0819   First MD Initiated Contact with Patient 12/11/12 [phone]      Chief Complaint  Patient presents with  . Headache    (Consider location/radiation/quality/duration/timing/severity/associated sxs/prior treatment) HPI  22 year old female accompanied by husband to the ER for evaluation of headache. Patient is from [place], [place]. History was obtained through her husband who was at bedside due to language barrier. Per husband, patient has been having chronic headache ongoing for the past 5 years. Describe the headache as a throbbing sensation behind her eyes, persistent, worsening in the past month. Accompanied with headache is occasional visual changes including light sensitivity and occasional transient visual loss lasting for seconds to minutes.  Also reports whenever she pushed her eyelid with her finger she would see flashes of light.  Husband states pt has been feeling fatigue with decrease daily activity x 1 month.  She is a G2P1 who is 2 months pregnant, LMP in December.  Husband also mentioned pt has evaluated by several doctors and also by eye specialist in the past, as recent as 6 months ago without any specific finding. Was recommended to take tylenol for headache, but that hasn't help.  Sts she never receive any imaging.  No significant finding on eye exam per husband.  Pt denies fever, chills, sneeze, cough, sorethroat, weight loss, n/v/d, cp, sob, abd pain, vaginal discharge, dysuria, or rash.    Pt does not wear contact or glasses.    OBGYN: Dr. Gaynell Face  Past Medical History  Diagnosis Date  . No pertinent past medical history     Past Surgical History  Procedure Laterality Date  . No past surgeries      No family history on file.  History  Substance Use Topics  . Smoking status: Never Smoker   . Smokeless tobacco: Not on file  . Alcohol Use: No    OB  History   Grav Para Term Preterm Abortions TAB SAB Ect Mult Living   1 1 1  0 0 0 0 0 0 1      Review of Systems  Constitutional:       10 Systems reviewed and all are negative for acute change except as noted in the HPI.     Allergies  Review of patient's allergies indicates no known allergies.  Home Medications   Current Outpatient Rx  Name  Route  Sig  Dispense  Refill  . Prenatal Vit-Fe Fumarate-FA (PRENATAL MULTIVITAMIN) TABS   Oral   Take 1 tablet by mouth daily.   60 tablet   5     BP 118/75  Pulse 93  Resp 16  SpO2 100%  Physical Exam  Nursing note and vitals reviewed. Constitutional: She is oriented to person, place, and time. She appears well-developed and well-nourished. No distress.  Awake, alert, nontoxic appearance  HENT:  Head: Normocephalic and atraumatic.  Right Ear: External ear normal.  Left Ear: External ear normal.  Nose: Nose normal.  Mouth/Throat: Oropharynx is clear and moist. No oropharyngeal exudate.  Eyes: Conjunctivae and EOM are normal. Pupils are equal, round, and reactive to light. Right eye exhibits no discharge. Left eye exhibits no discharge.  No visual field cut, EOMI  Neck: Normal range of motion. Neck supple.  No nuchal rigidity  Cardiovascular: Normal rate and regular rhythm.   Pulmonary/Chest: Effort normal. No respiratory distress. She exhibits no  tenderness.  Abdominal: Soft. There is no tenderness. There is no rebound.  Musculoskeletal: She exhibits no edema and no tenderness.  ROM appears intact, no obvious focal weakness  Lymphadenopathy:    She has no cervical adenopathy.  Neurological: She is alert and oriented to person, place, and time. She has normal strength. No cranial nerve deficit or sensory deficit. She displays a negative Romberg sign. Coordination and gait normal. GCS eye subscore is 4. GCS verbal subscore is 5. GCS motor subscore is 6.  Mental status and motor strength appears intact  Skin: No rash noted.   Psychiatric: She has a normal mood and affect.    ED Course  Procedures (including critical care time)   09:44:28 Visual Acuity Visual Acuity - Bilateral Distance: 20/40 ; R Distance: 20/40 ; L Distance: 20/40 Wynelle Link, RN  8:57 AM Patient was seen and evaluated by me for her chronic headache. She appears to be in no acute distress. Has no focal neuro deficit, and no nuchal rigidity.  Since pt c/o visual changes, will obtain visual acuity.  She has a normal eye exam, PERRL, EOMI, no visual field cut.  Check pregnancy test.   No specific sxs concerning for MS, infectious etiology, stroke, meningitis, SAH at this time.  Care discussed with attending.  Pt likely benefit from neuro f/u and PCP f/u for further evaluation.    10:12 AM Pregnancy test is positive. However patient has no complaint of dysuria, abdominal pain, or pelvic pain warranted further evaluation at this time.  She does have a OBGYN and is aware that she is pregnant.  Her visual acuity test is unremarkable.  Will give referral to neuro and PCP for further care.  CT scan not warranted due to chronicity of sxs and is currently pregnant.  All questions answer to patient's satisfaction.  BP 118/75  Pulse 93  Temp(Src) 98.1 F (36.7 C) (Oral)  Resp 16  SpO2 100%  I have reviewed nursing notes and vital signs.  I reviewed available ER/hospitalization records thought the EMR  1. Chronic headache MDM          Fayrene Helper, PA-C 12/11/12 1014

## 2012-12-11 NOTE — ED Notes (Signed)
Pt states she has had headache for over past month, worse with light and sounds.  Pt states pain is behind eye.

## 2012-12-11 NOTE — ED Provider Notes (Cosign Needed)
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  Please delete this patient chart.  Refer to MRN: [id number] for official documentation of this patient.     Fayrene Helper, PA-C 12/11/12 1520

## 2012-12-11 NOTE — ED Provider Notes (Signed)
Medical screening examination/treatment/procedure(s) were performed by non-physician practitioner and as supervising physician I was immediately available for consultation/collaboration.  Hurman Horn, MD 12/11/12 [phone]

## 2012-12-21 LAB — OB RESULTS CONSOLE ABO/RH

## 2012-12-21 LAB — OB RESULTS CONSOLE GC/CHLAMYDIA
Chlamydia: NEGATIVE
Gonorrhea: NEGATIVE

## 2012-12-21 LAB — OB RESULTS CONSOLE ANTIBODY SCREEN: Antibody Screen: NEGATIVE

## 2013-03-02 ENCOUNTER — Encounter (HOSPITAL_COMMUNITY): Payer: Self-pay | Admitting: *Deleted

## 2013-03-02 ENCOUNTER — Inpatient Hospital Stay (HOSPITAL_COMMUNITY)
Admission: AD | Admit: 2013-03-02 | Discharge: 2013-03-02 | Disposition: A | Discharge status: Home / Self Care | Payer: BC Managed Care – PPO | Source: Ambulatory Visit | Attending: Obstetrics | Admitting: Obstetrics

## 2013-03-02 DIAGNOSIS — R1011 Right upper quadrant pain: Secondary | ICD-10-CM

## 2013-03-02 DIAGNOSIS — R1012 Left upper quadrant pain: Secondary | ICD-10-CM

## 2013-03-02 DIAGNOSIS — R109 Unspecified abdominal pain: Secondary | ICD-10-CM | POA: Insufficient documentation

## 2013-03-02 DIAGNOSIS — O99891 Other specified diseases and conditions complicating pregnancy: Secondary | ICD-10-CM | POA: Insufficient documentation

## 2013-03-02 HISTORY — DX: Extended spectrum beta lactamase (ESBL) resistance: Z16.12

## 2013-03-02 LAB — CBC WITH DIFFERENTIAL/PLATELET
Basophils Absolute: 0 10*3/uL (ref 0.0–0.1)
Basophils Relative: 0 % (ref 0–1)
HCT: 27 % — ABNORMAL LOW (ref 36.0–46.0)
MCHC: 32.2 g/dL (ref 30.0–36.0)
Monocytes Absolute: 0.9 10*3/uL (ref 0.1–1.0)
Neutro Abs: 9.6 10*3/uL — ABNORMAL HIGH (ref 1.7–7.7)
Platelets: 129 10*3/uL — ABNORMAL LOW (ref 150–400)
RDW: 13.7 % (ref 11.5–15.5)
WBC: 13.2 10*3/uL — ABNORMAL HIGH (ref 4.0–10.5)

## 2013-03-02 LAB — URINALYSIS, ROUTINE W REFLEX MICROSCOPIC
Bilirubin Urine: NEGATIVE
Ketones, ur: NEGATIVE mg/dL
Nitrite: NEGATIVE
Protein, ur: NEGATIVE mg/dL
Urobilinogen, UA: 0.2 mg/dL (ref 0.0–1.0)

## 2013-03-02 LAB — AMYLASE: Amylase: 65 U/L (ref 0–105)

## 2013-03-02 LAB — LIPASE, BLOOD: Lipase: 25 U/L (ref 11–59)

## 2013-03-02 LAB — WET PREP, GENITAL: Clue Cells Wet Prep HPF POC: NONE SEEN

## 2013-03-02 LAB — URINE MICROSCOPIC-ADD ON

## 2013-03-02 LAB — COMPREHENSIVE METABOLIC PANEL
ALT: 5 U/L (ref 0–35)
AST: 11 U/L (ref 0–37)
Albumin: 2.9 g/dL — ABNORMAL LOW (ref 3.5–5.2)
Calcium: 8.9 mg/dL (ref 8.4–10.5)
Chloride: 101 mEq/L (ref 96–112)
Creatinine, Ser: 0.69 mg/dL (ref 0.50–1.10)
Sodium: 133 mEq/L — ABNORMAL LOW (ref 135–145)
Total Bilirubin: 0.1 mg/dL — ABNORMAL LOW (ref 0.3–1.2)

## 2013-03-02 MED ORDER — FAMOTIDINE 20 MG PO TABS
20.0000 mg | ORAL_TABLET | Freq: Once | ORAL | Status: AC
  Administered 2013-03-02: 20 mg via ORAL
  Filled 2013-03-02: qty 1

## 2013-03-02 NOTE — MAU Provider Note (Signed)
History     CSN: [id number]  Arrival date and time: 03/02/13 1736   First Provider Initiated Contact with Patient 03/02/13 1834      Chief Complaint  Patient presents with  . Abdominal Pain   HPI Catherine Gomez is 22 y.o. G2P1001 [redacted]w[redacted]d weeks presenting with upper abdominal pain.  Through the [employer], She speaks [place].  I understand that she feels a heaviness in her stomach, ? Fever, some nausea.  Her husband reports she eats a lot of meat.  Normal bowel movements.  She denies vaginal bleeding, discharge.  She does feel the baby move.  She is an established patient of Dr. Elsie Stain.   Past Medical History  Diagnosis Date  . Extended spectrum beta lactamase (ESBL) resistance     Past Surgical History  Procedure Laterality Date  . No past surgeries      History reviewed. No pertinent family history.  History  Substance Use Topics  . Smoking status: Never Smoker   . Smokeless tobacco: Not on file  . Alcohol Use: No    Allergies: No Known Allergies  Prescriptions prior to admission  Medication Sig Dispense Refill  . acetaminophen (TYLENOL) 500 MG tablet Take 1 tablet (500 mg total) by mouth every 6 (six) hours as needed for pain.  30 tablet  0  . Prenatal Vit-Fe Fumarate-FA (PRENATAL MULTIVITAMIN) TABS Take 1 tablet by mouth daily.  60 tablet  5    Review of Systems  Constitutional: Positive for fever. Negative for chills.  Gastrointestinal: Positive for abdominal pain (upper abdominal pain). Negative for nausea, vomiting, diarrhea and constipation.  Genitourinary:       Neg for vaginal discharge and bleeding.  + fetal movement   Physical Exam   Blood pressure 109/68, pulse 111, temperature 98 F (36.7 C), temperature source Oral, resp. rate 18, height 5\' 4"  (1.626 m), weight 131 lb (59.421 kg).  Physical Exam  Constitutional: She is oriented to person, place, and time. She appears well-developed and well-nourished. No distress.  HENT:  Head:  Normocephalic.  Neck: Normal range of motion.  Cardiovascular: Normal rate.   Respiratory: Effort normal.  GI: Soft. She exhibits no distension and no mass. There is tenderness (moderate tenderness in the upper quadrants and mid upper abdomen). There is no rebound and no guarding.  Genitourinary: There is no rash, tenderness or lesion on the right labia. There is no rash, tenderness or lesion on the left labia. Uterus is enlarged (1 cm below umbilicus). Uterus is not tender. Cervix exhibits no motion tenderness, no discharge and no friability. No bleeding around the vagina. Vaginal discharge (small amount of white discharge wtihout odor.) found.  Cervix is closed.  There is no pooling of fluid or bleeding noted.  Neurological: She is alert and oriented to person, place, and time.  Skin: Skin is warm and dry.  Psychiatric: She has a normal mood and affect. Her behavior is normal.   Results for orders placed during the hospital encounter of 03/02/13 (from the past 24 hour(s))  URINALYSIS, ROUTINE W REFLEX MICROSCOPIC     Status: Abnormal   Collection Time    03/02/13  5:54 PM      Result Value Range   Color, Urine YELLOW  YELLOW   APPearance CLEAR  CLEAR   Specific Gravity, Urine 1.015  1.005 - 1.030   pH 7.0  5.0 - 8.0   Glucose, UA NEGATIVE  NEGATIVE mg/dL   Hgb urine dipstick NEGATIVE  NEGATIVE  Bilirubin Urine NEGATIVE  NEGATIVE   Ketones, ur NEGATIVE  NEGATIVE mg/dL   Protein, ur NEGATIVE  NEGATIVE mg/dL   Urobilinogen, UA 0.2  0.0 - 1.0 mg/dL   Nitrite NEGATIVE  NEGATIVE   Leukocytes, UA SMALL (*) NEGATIVE  URINE MICROSCOPIC-ADD ON     Status: Abnormal   Collection Time    03/02/13  5:54 PM      Result Value Range   Squamous Epithelial / LPF MANY (*) RARE   WBC, UA 3-6  <3 WBC/hpf   RBC / HPF 0-2  <3 RBC/hpf   Bacteria, UA FEW (*) RARE  WET PREP, GENITAL     Status: Abnormal   Collection Time    03/02/13  6:51 PM      Result Value Range   Yeast Wet Prep HPF POC FEW (*)  NONE SEEN   Trich, Wet Prep NONE SEEN  NONE SEEN   Clue Cells Wet Prep HPF POC NONE SEEN  NONE SEEN   WBC, Wet Prep HPF POC MODERATE (*) NONE SEEN  CBC WITH DIFFERENTIAL     Status: Abnormal   Collection Time    03/02/13  7:01 PM      Result Value Range   WBC 13.2 (*) 4.0 - 10.5 K/uL   RBC 3.24 (*) 3.87 - 5.11 MIL/uL   Hemoglobin 8.7 (*) 12.0 - 15.0 g/dL   HCT 45.4 (*) 09.8 - 11.9 %   MCV 83.3  78.0 - 100.0 fL   MCH 26.9  26.0 - 34.0 pg   MCHC 32.2  30.0 - 36.0 g/dL   RDW 14.7  82.9 - 56.2 %   Platelets 129 (*) 150 - 400 K/uL   Neutrophils Relative % 72  43 - 77 %   Neutro Abs 9.6 (*) 1.7 - 7.7 K/uL   Lymphocytes Relative 18  12 - 46 %   Lymphs Abs 2.4  0.7 - 4.0 K/uL   Monocytes Relative 7  3 - 12 %   Monocytes Absolute 0.9  0.1 - 1.0 K/uL   Eosinophils Relative 3  0 - 5 %   Eosinophils Absolute 0.4  0.0 - 0.7 K/uL   Basophils Relative 0  0 - 1 %   Basophils Absolute 0.0  0.0 - 0.1 K/uL  COMPREHENSIVE METABOLIC PANEL     Status: Abnormal   Collection Time    03/02/13  7:01 PM      Result Value Range   Sodium 133 (*) 135 - 145 mEq/L   Potassium 4.2  3.5 - 5.1 mEq/L   Chloride 101  96 - 112 mEq/L   CO2 25  19 - 32 mEq/L   Glucose, Bld 82  70 - 99 mg/dL   BUN 15  6 - 23 mg/dL   Creatinine, Ser 1.30  0.50 - 1.10 mg/dL   Calcium 8.9  8.4 - 86.5 mg/dL   Total Protein 6.7  6.0 - 8.3 g/dL   Albumin 2.9 (*) 3.5 - 5.2 g/dL   AST 11  0 - 37 U/L   ALT 5  0 - 35 U/L   Alkaline Phosphatase 57  39 - 117 U/L   Total Bilirubin 0.1 (*) 0.3 - 1.2 mg/dL   GFR calc non Af Amer >90  >90 mL/min   GFR calc Af Amer >90  >90 mL/min    MAU Course  Procedures  GC/CHL cultures to lab  MDM 19:40  Reported MSE, labs and FHR to Dr. Tamela Oddi.  She is  ok with Pepcid order.  Patient does not need to wait for Amylase and Lipase results.   Discharge to home and have her follow up with Dr. Gaynell Face on Monday  Patient is comfortable at time of discharge  Assessment and Plan  A: Upper abdominal  pain at [redacted]w[redacted]d gestaton     Viable pregnancy     Amylase and Lipase pending  P:  Patient discharged to home with instructions to avoid fried, greasy, spicy food      Importance of staying well hydrated     Follow up with Dr. Gaynell Face on Monday or return for further evaluation if sxs worsen.  Izekiel Flegel,EVE M 03/02/2013, 6:36 PM

## 2013-03-02 NOTE — MAU Note (Signed)
Body pains, stomach tight. 3 days now. Does not feel well. Hurts when she eats and after she eats. Sometimes throws the food back up. Feels very heavy, like a rock in my stomach like something needs to move. Bowel movements normal. Feeling FM.

## 2013-03-02 NOTE — MAU Note (Signed)
Patient presents with C/O of abdominal pain. Attempting to connect with interpretor via [employer]. [place] interpretor not available. Tried french. Patient not able to understand. [employer] trying to find another source.

## 2013-03-03 LAB — URINE CULTURE: Colony Count: 95000

## 2013-03-25 ENCOUNTER — Inpatient Hospital Stay (HOSPITAL_COMMUNITY)
Admission: AD | Admit: 2013-03-25 | Discharge: 2013-03-25 | Disposition: A | Discharge status: Home / Self Care | Payer: Medicaid Other | Source: Ambulatory Visit | Attending: Obstetrics | Admitting: Obstetrics

## 2013-03-25 ENCOUNTER — Encounter (HOSPITAL_COMMUNITY): Payer: Self-pay | Admitting: *Deleted

## 2013-03-25 DIAGNOSIS — O99891 Other specified diseases and conditions complicating pregnancy: Secondary | ICD-10-CM | POA: Insufficient documentation

## 2013-03-25 DIAGNOSIS — O9989 Other specified diseases and conditions complicating pregnancy, childbirth and the puerperium: Secondary | ICD-10-CM

## 2013-03-25 DIAGNOSIS — R109 Unspecified abdominal pain: Secondary | ICD-10-CM

## 2013-03-25 DIAGNOSIS — O26899 Other specified pregnancy related conditions, unspecified trimester: Secondary | ICD-10-CM

## 2013-03-25 HISTORY — DX: Headache: R51

## 2013-03-25 LAB — CBC
HCT: 27.3 % — ABNORMAL LOW (ref 36.0–46.0)
MCHC: 32.2 g/dL (ref 30.0–36.0)
Platelets: 136 10*3/uL — ABNORMAL LOW (ref 150–400)
RDW: 13.4 % (ref 11.5–15.5)
WBC: 10.1 10*3/uL (ref 4.0–10.5)

## 2013-03-25 LAB — WET PREP, GENITAL
Clue Cells Wet Prep HPF POC: NONE SEEN
Yeast Wet Prep HPF POC: NONE SEEN

## 2013-03-25 LAB — URINALYSIS, ROUTINE W REFLEX MICROSCOPIC
Glucose, UA: NEGATIVE mg/dL
Ketones, ur: NEGATIVE mg/dL
Protein, ur: NEGATIVE mg/dL
pH: 6 (ref 5.0–8.0)

## 2013-03-25 LAB — URINE MICROSCOPIC-ADD ON

## 2013-03-25 MED ORDER — PROMETHAZINE HCL 25 MG PO TABS: 25.0000 mg | ORAL_TABLET | Freq: Four times a day (QID) | ORAL | Status: DC | PRN

## 2013-03-25 MED ORDER — ACETAMINOPHEN 500 MG PO TABS
1000.0000 mg | ORAL_TABLET | Freq: Once | ORAL | Status: AC
  Administered 2013-03-25: 1000 mg via ORAL
  Filled 2013-03-25: qty 2

## 2013-03-25 MED ORDER — CYCLOBENZAPRINE HCL 10 MG PO TABS
10.0000 mg | ORAL_TABLET | Freq: Once | ORAL | Status: AC
  Administered 2013-03-25: 10 mg via ORAL
  Filled 2013-03-25: qty 1

## 2013-03-25 NOTE — MAU Note (Signed)
C/o sharp stabbing abdominal pain since yesterday;

## 2013-03-25 NOTE — MAU Provider Note (Signed)
History     CSN: [id number]  Arrival date and time: 03/25/13 4098   First Provider Initiated Contact with Patient 03/25/13 [phone]      Chief Complaint  Patient presents with  . Abdominal Pain   HPI [name] is a 22 y.o. G2P1001 at [redacted]w[redacted]d who presents to MAU today with complaint of abdominal pain. The patient states that the pain started yesterday and is just right of the umbilicus. The patient states that the pain was worse after they went for a walk in the park yesterday. She denies vaginal bleeding, discharge or LOF. She denies N/V/D but states that she has had decreased appetite secondary to the pain. She has not had fever or contractions. She reports good fetal movement.   OB History   Grav Para Term Preterm Abortions TAB SAB Ect Mult Living   2 1 1  0 0 0 0 0 0 1      Past Medical History  Diagnosis Date  . Extended spectrum beta lactamase (ESBL) resistance   . YNWGNFAO(130.8)     Past Surgical History  Procedure Laterality Date  . No past surgeries      Family History  Problem Relation Age of Onset  . Alcohol abuse Neg Hx   . Arthritis Neg Hx   . Asthma Neg Hx   . Birth defects Neg Hx   . Cancer Neg Hx   . COPD Neg Hx   . Depression Neg Hx   . Diabetes Neg Hx   . Drug abuse Neg Hx   . Early death Neg Hx   . Hearing loss Neg Hx   . Heart disease Neg Hx   . Hyperlipidemia Neg Hx   . Hypertension Neg Hx   . Kidney disease Neg Hx   . Learning disabilities Neg Hx   . Mental illness Neg Hx   . Mental retardation Neg Hx   . Miscarriages / Stillbirths Neg Hx   . Stroke Neg Hx   . Vision loss Neg Hx     History  Substance Use Topics  . Smoking status: Never Smoker   . Smokeless tobacco: Not on file  . Alcohol Use: No    Allergies: No Known Allergies  No prescriptions prior to admission    Review of Systems  Constitutional: Negative for fever.  Gastrointestinal: Positive for abdominal pain. Negative for nausea, vomiting and diarrhea.   Genitourinary: Negative for dysuria, urgency and frequency.       Neg - vaginal bleeding, discharge, LOF   Physical Exam   Blood pressure 110/66, pulse 89, temperature 97.4 F (36.3 C), temperature source Oral, resp. rate 16.  Physical Exam  Constitutional: She is oriented to person, place, and time. She appears well-developed and well-nourished. No distress.  HENT:  Head: Normocephalic and atraumatic.  Cardiovascular: Normal rate, regular rhythm and normal heart sounds.   Respiratory: Effort normal and breath sounds normal. No respiratory distress.  GI: Soft. Bowel sounds are normal. She exhibits no distension and no mass. There is tenderness (moderate tenderness to palpation of the abdomen just right of the umbilicus). There is no rebound and no guarding.  At the point of tenderness there is a small protrusion without erythema.   Genitourinary: Uterus is enlarged (appropriate for GA). Uterus is not tender. Cervix exhibits discharge (small amount of mucus discharge noted). Cervix exhibits no motion tenderness and no friability. No bleeding around the vagina. Vaginal discharge (moderate amount of thin, white discharge noted in the vagina) found.  Neurological: She is alert and oriented to person, place, and time.  Skin: Skin is warm and dry. No erythema.  Psychiatric: She has a normal mood and affect.  Dilation: Closed Station:  (high) Exam by:: Naaman Plummer PA  Results for orders placed during the hospital encounter of 03/25/13 (from the past 24 hour(s))  URINALYSIS, ROUTINE W REFLEX MICROSCOPIC     Status: Abnormal   Collection Time    03/25/13  8:30 AM      Result Value Range   Color, Urine YELLOW  YELLOW   APPearance CLEAR  CLEAR   Specific Gravity, Urine 1.010  1.005 - 1.030   pH 6.0  5.0 - 8.0   Glucose, UA NEGATIVE  NEGATIVE mg/dL   Hgb urine dipstick TRACE (*) NEGATIVE   Bilirubin Urine NEGATIVE  NEGATIVE   Ketones, ur NEGATIVE  NEGATIVE mg/dL   Protein, ur NEGATIVE   NEGATIVE mg/dL   Urobilinogen, UA 0.2  0.0 - 1.0 mg/dL   Nitrite NEGATIVE  NEGATIVE   Leukocytes, UA LARGE (*) NEGATIVE  URINE MICROSCOPIC-ADD ON     Status: Abnormal   Collection Time    03/25/13  8:30 AM      Result Value Range   Squamous Epithelial / LPF MANY (*) RARE   WBC, UA TOO NUMEROUS TO COUNT  <3 WBC/hpf   RBC / HPF 0-2  <3 RBC/hpf   Bacteria, UA FEW (*) RARE   Urine-Other MUCOUS PRESENT    WET PREP, GENITAL     Status: Abnormal   Collection Time    03/25/13  9:05 AM      Result Value Range   Yeast Wet Prep HPF POC NONE SEEN  NONE SEEN   Trich, Wet Prep NONE SEEN  NONE SEEN   Clue Cells Wet Prep HPF POC NONE SEEN  NONE SEEN   WBC, Wet Prep HPF POC MANY (*) NONE SEEN  CBC     Status: Abnormal   Collection Time    03/25/13  9:46 AM      Result Value Range   WBC 10.1  4.0 - 10.5 K/uL   RBC 3.22 (*) 3.87 - 5.11 MIL/uL   Hemoglobin 8.8 (*) 12.0 - 15.0 g/dL   HCT 16.1 (*) 09.6 - 04.5 %   MCV 84.8  78.0 - 100.0 fL   MCH 27.3  26.0 - 34.0 pg   MCHC 32.2  30.0 - 36.0 g/dL   RDW 40.9  81.1 - 91.4 %   Platelets 136 (*) 150 - 400 K/uL   Fetal Monitoring: Baseline: 130 bpm, moderate variability, + accelerations, no decelerations Contractions: mild uterine irritability  MAU Course  Procedures None  MDM UA, Wet prep, CBC today Discussed with [facility]. Ok for discharge with precautions. Follow-up in the office as scheduled  Assessment and Plan  A: Abdominal pain in pregnancy  P: Discharge home Rx for Phenergan sent to patient's pharmacy Acute abdomen precautions reviewed Patient encouraged to keep follow-up with Dr. Gaynell Face as scheduled or sooner if symptoms worsen Patient may return to MAU as needed  Freddi Starr, PA-C  03/25/2013, 2:46 PM

## 2013-03-26 LAB — URINE CULTURE

## 2013-05-16 LAB — OB RESULTS CONSOLE GBS: GBS: NEGATIVE

## 2013-06-15 ENCOUNTER — Inpatient Hospital Stay (HOSPITAL_COMMUNITY)
Admission: AD | Admit: 2013-06-15 | Discharge: 2013-06-15 | Disposition: A | Discharge status: Home / Self Care | Payer: Medicaid Other | Source: Ambulatory Visit | Attending: Obstetrics | Admitting: Obstetrics

## 2013-06-15 ENCOUNTER — Encounter (HOSPITAL_COMMUNITY): Payer: Self-pay | Admitting: *Deleted

## 2013-06-15 DIAGNOSIS — O479 False labor, unspecified: Secondary | ICD-10-CM | POA: Insufficient documentation

## 2013-06-15 LAB — CBC
HCT: 28.3 % — ABNORMAL LOW (ref 36.0–46.0)
Hemoglobin: 8.9 g/dL — ABNORMAL LOW (ref 12.0–15.0)
MCH: 25.1 pg — ABNORMAL LOW (ref 26.0–34.0)
MCHC: 31.4 g/dL (ref 30.0–36.0)
MCV: 79.9 fL (ref 78.0–100.0)
RBC: 3.54 MIL/uL — ABNORMAL LOW (ref 3.87–5.11)

## 2013-06-15 LAB — RPR: RPR Ser Ql: NONREACTIVE

## 2013-06-15 MED ORDER — LACTATED RINGERS IV SOLN
INTRAVENOUS | Status: DC
  Administered 2013-06-15: 07:00:00 via INTRAVENOUS

## 2013-06-15 MED ORDER — BUTORPHANOL TARTRATE 1 MG/ML IJ SOLN
1.0000 mg | INTRAMUSCULAR | Status: DC | PRN
  Administered 2013-06-15: 1 mg via INTRAVENOUS

## 2013-06-15 MED ORDER — BUTORPHANOL TARTRATE 1 MG/ML IJ SOLN
INTRAMUSCULAR | Status: AC
  Filled 2013-06-15: qty 1

## 2013-06-15 NOTE — MAU Note (Signed)
PT SAYS SHE STARTED  HURTING BAD AT  2300.    VE IN OFFICE 1 CM.

## 2013-06-15 NOTE — Progress Notes (Signed)
Monitors off for pt to ambulate

## 2013-06-18 ENCOUNTER — Inpatient Hospital Stay (HOSPITAL_COMMUNITY)
Admission: AD | Admit: 2013-06-18 | Discharge: 2013-06-18 | Disposition: A | Discharge status: Home / Self Care | Payer: Medicaid Other | Source: Ambulatory Visit | Attending: Obstetrics | Admitting: Obstetrics

## 2013-06-18 ENCOUNTER — Encounter (HOSPITAL_COMMUNITY): Payer: Self-pay | Admitting: Family

## 2013-06-18 DIAGNOSIS — O479 False labor, unspecified: Secondary | ICD-10-CM | POA: Insufficient documentation

## 2013-06-18 MED ORDER — ZOLPIDEM TARTRATE 5 MG PO TABS
5.0000 mg | ORAL_TABLET | Freq: Once | ORAL | Status: AC
  Administered 2013-06-18: 5 mg via ORAL
  Filled 2013-06-18: qty 1

## 2013-06-18 NOTE — MAU Note (Signed)
Pt G2 P1 at 39.3wks having contractions, unable to sleep x 2 days.

## 2013-06-18 NOTE — MAU Note (Signed)
Patient presents to MAU with c/o contractions every minute x 3 hours. Reports +FM; denies VB, LOF. Denies HSV.

## 2013-06-25 ENCOUNTER — Encounter (HOSPITAL_COMMUNITY): Payer: Self-pay | Admitting: *Deleted

## 2013-06-25 ENCOUNTER — Inpatient Hospital Stay (HOSPITAL_COMMUNITY)
Admission: AD | Admit: 2013-06-25 | Discharge: 2013-06-27 | DRG: 775 | Disposition: A | Discharge status: Home / Self Care | Payer: Medicaid Other | Source: Ambulatory Visit | Attending: Obstetrics | Admitting: Obstetrics

## 2013-06-25 LAB — CBC
MCH: 25.1 pg — ABNORMAL LOW (ref 26.0–34.0)
MCV: 79.2 fL (ref 78.0–100.0)
Platelets: 161 10*3/uL (ref 150–400)
RDW: 14.7 % (ref 11.5–15.5)

## 2013-06-25 LAB — RPR: RPR Ser Ql: NONREACTIVE

## 2013-06-25 MED ORDER — OXYCODONE-ACETAMINOPHEN 5-325 MG PO TABS: 1.0000 | ORAL_TABLET | ORAL | Status: DC | PRN

## 2013-06-25 MED ORDER — PRENATAL MULTIVITAMIN CH
1.0000 | ORAL_TABLET | Freq: Every day | ORAL | Status: DC
  Administered 2013-06-26: 1 via ORAL
  Filled 2013-06-25: qty 1

## 2013-06-25 MED ORDER — OXYTOCIN 40 UNITS IN LACTATED RINGERS INFUSION - SIMPLE MED: 62.5000 mL/h | INTRAVENOUS | Status: DC

## 2013-06-25 MED ORDER — BENZOCAINE-MENTHOL 20-0.5 % EX AERO: 1.0000 "application " | INHALATION_SPRAY | CUTANEOUS | Status: DC | PRN

## 2013-06-25 MED ORDER — ACETAMINOPHEN 325 MG PO TABS: 650.0000 mg | ORAL_TABLET | ORAL | Status: DC | PRN

## 2013-06-25 MED ORDER — ONDANSETRON HCL 4 MG/2ML IJ SOLN: 4.0000 mg | INTRAMUSCULAR | Status: DC | PRN

## 2013-06-25 MED ORDER — ONDANSETRON HCL 4 MG/2ML IJ SOLN: 4.0000 mg | Freq: Four times a day (QID) | INTRAMUSCULAR | Status: DC | PRN

## 2013-06-25 MED ORDER — IBUPROFEN 600 MG PO TABS
600.0000 mg | ORAL_TABLET | Freq: Four times a day (QID) | ORAL | Status: DC | PRN
  Filled 2013-06-25 (×4): qty 1

## 2013-06-25 MED ORDER — WITCH HAZEL-GLYCERIN EX PADS: 1.0000 "application " | MEDICATED_PAD | CUTANEOUS | Status: DC | PRN

## 2013-06-25 MED ORDER — CITRIC ACID-SODIUM CITRATE 334-500 MG/5ML PO SOLN: 30.0000 mL | ORAL | Status: DC | PRN

## 2013-06-25 MED ORDER — PRENATAL MULTIVITAMIN CH: 1.0000 | ORAL_TABLET | Freq: Every day | ORAL | Status: DC

## 2013-06-25 MED ORDER — FERROUS SULFATE 325 (65 FE) MG PO TABS
325.0000 mg | ORAL_TABLET | Freq: Two times a day (BID) | ORAL | Status: DC
  Administered 2013-06-26 (×2): 325 mg via ORAL
  Filled 2013-06-25 (×2): qty 1

## 2013-06-25 MED ORDER — TERBUTALINE SULFATE 1 MG/ML IJ SOLN: 0.2500 mg | Freq: Once | INTRAMUSCULAR | Status: DC | PRN

## 2013-06-25 MED ORDER — OXYTOCIN 40 UNITS IN LACTATED RINGERS INFUSION - SIMPLE MED
1.0000 m[IU]/min | INTRAVENOUS | Status: DC
  Administered 2013-06-25: 2 m[IU]/min via INTRAVENOUS
  Filled 2013-06-25: qty 1000

## 2013-06-25 MED ORDER — IBUPROFEN 600 MG PO TABS: 600.0000 mg | ORAL_TABLET | Freq: Four times a day (QID) | ORAL | Status: DC | PRN

## 2013-06-25 MED ORDER — LACTATED RINGERS IV SOLN: 500.0000 mL | INTRAVENOUS | Status: DC | PRN

## 2013-06-25 MED ORDER — FLEET ENEMA 7-19 GM/118ML RE ENEM: 1.0000 | ENEMA | RECTAL | Status: DC | PRN

## 2013-06-25 MED ORDER — OXYTOCIN BOLUS FROM INFUSION: 500.0000 mL | INTRAVENOUS | Status: DC

## 2013-06-25 MED ORDER — DIBUCAINE 1 % RE OINT: 1.0000 "application " | TOPICAL_OINTMENT | RECTAL | Status: DC | PRN

## 2013-06-25 MED ORDER — SIMETHICONE 80 MG PO CHEW: 80.0000 mg | CHEWABLE_TABLET | ORAL | Status: DC | PRN

## 2013-06-25 MED ORDER — DIPHENHYDRAMINE HCL 25 MG PO CAPS: 25.0000 mg | ORAL_CAPSULE | Freq: Four times a day (QID) | ORAL | Status: DC | PRN

## 2013-06-25 MED ORDER — LACTATED RINGERS IV SOLN
500.0000 mL | INTRAVENOUS | Status: DC | PRN
Start: 2013-06-25 — End: 2013-06-25

## 2013-06-25 MED ORDER — LANOLIN HYDROUS EX OINT: TOPICAL_OINTMENT | CUTANEOUS | Status: DC | PRN

## 2013-06-25 MED ORDER — BUTORPHANOL TARTRATE 1 MG/ML IJ SOLN
1.0000 mg | INTRAMUSCULAR | Status: DC | PRN
  Administered 2013-06-25 (×2): 1 mg via INTRAVENOUS
  Filled 2013-06-25 (×2): qty 1

## 2013-06-25 MED ORDER — ZOLPIDEM TARTRATE 5 MG PO TABS: 5.0000 mg | ORAL_TABLET | Freq: Every evening | ORAL | Status: DC | PRN

## 2013-06-25 MED ORDER — ONDANSETRON HCL 4 MG PO TABS: 4.0000 mg | ORAL_TABLET | ORAL | Status: DC | PRN

## 2013-06-25 MED ORDER — LIDOCAINE HCL (PF) 1 % IJ SOLN: 30.0000 mL | INTRAMUSCULAR | Status: DC | PRN

## 2013-06-25 MED ORDER — LACTATED RINGERS IV SOLN
INTRAVENOUS | Status: DC
  Administered 2013-06-25: 12:00:00 via INTRAVENOUS

## 2013-06-25 MED ORDER — LIDOCAINE HCL (PF) 1 % IJ SOLN
30.0000 mL | INTRAMUSCULAR | Status: DC | PRN
  Filled 2013-06-25 (×2): qty 30

## 2013-06-25 MED ORDER — SENNOSIDES-DOCUSATE SODIUM 8.6-50 MG PO TABS: 2.0000 | ORAL_TABLET | ORAL | Status: DC

## 2013-06-25 MED ORDER — IBUPROFEN 600 MG PO TABS
600.0000 mg | ORAL_TABLET | Freq: Four times a day (QID) | ORAL | Status: DC
  Administered 2013-06-25 – 2013-06-27 (×6): 600 mg via ORAL
  Filled 2013-06-25 (×2): qty 1

## 2013-06-25 MED ORDER — TETANUS-DIPHTH-ACELL PERTUSSIS 5-2.5-18.5 LF-MCG/0.5 IM SUSP
0.5000 mL | Freq: Once | INTRAMUSCULAR | Status: AC
  Administered 2013-06-27: 0.5 mL via INTRAMUSCULAR

## 2013-06-25 NOTE — H&P (Signed)
This is Dr. Francoise Ceo dictating the history and physical on  Catherine Gomez she's a 22 year old gravida 2 para 1001 negative GBS 40 weeks and 3 days date 06/22/2013 in for induction of labor cervix 3 cm 80% vertex -1 amniotomy performed the fluids clear she is on low-dose Pitocin Past medical history negative Past surgical history negative System review negative Social history negative Physical exam well-developed female in no in no distress HEENT negative Lungs lungs clear to P&A Heart regular rhythm no murmurs no gallops Abdomen term Pelvic as described above Extremities negative and

## 2013-06-26 LAB — CBC
HCT: 27.4 % — ABNORMAL LOW (ref 36.0–46.0)
Hemoglobin: 8.8 g/dL — ABNORMAL LOW (ref 12.0–15.0)
MCHC: 32.1 g/dL (ref 30.0–36.0)
MCV: 78.7 fL (ref 78.0–100.0)
RDW: 14.6 % (ref 11.5–15.5)

## 2013-06-26 NOTE — Progress Notes (Signed)
Patient ID: Catherine Gomez, female   DOB: 12/08/1990, 22 y.o.   MRN: [id number] Postpartum day one Vital signs normal Fundus firm Lochia moderate Doing well

## 2013-06-26 NOTE — Lactation Note (Signed)
This note was copied from the chart of Catherine Gomez. Lactation Consultation Note  Patient Name: Catherine Gomez Today's Date: 06/26/2013 Reason for consult: Initial assessment FOB present to translate. Mom is breast and bottle feeding. Discussed importance of breastfeeding each feeding before giving any supplement. Encouraged not to give such large amounts of formula this early. Mom reports "no milk". Demonstrated hand expression, colostrum present. Stressed to parents the importance of baby getting colostrum for her immune system, benefits discussed. Encouraged Mom to BF with feeding ques, at least every 3 hours. Baby spit up small amount of formula at this visit. Lactation brochure left for review. Advised of OP services and support group. Advised Mom to call for assist with breastfeeding if desired.   Maternal Data Formula Feeding for Exclusion: Yes Reason for exclusion: Mother's choice to formula and breast feed on admission Infant to breast within first hour of birth: No Breastfeeding delayed due to:: Maternal status Has patient been taught Hand Expression?: Yes Does the patient have breastfeeding experience prior to this delivery?: No  Feeding Feeding Type: Formula Nipple Type: Regular  LATCH Score/Interventions                      Lactation Tools Discussed/Used     Consult Status Consult Status: Follow-up Date: 06/27/13 Follow-up type: In-patient    Alfred Levins 06/26/2013, 11:28 PM

## 2013-06-27 MED ORDER — FUSION PLUS PO CAPS: 1.0000 | ORAL_CAPSULE | Freq: Every day | ORAL | Status: DC

## 2013-06-27 MED ORDER — INFLUENZA VAC SPLIT QUAD 0.5 ML IM SUSP
0.5000 mL | Freq: Once | INTRAMUSCULAR | Status: AC
  Administered 2013-06-27: 0.5 mL via INTRAMUSCULAR

## 2013-06-27 MED ORDER — OXYCODONE-ACETAMINOPHEN 5-325 MG PO TABS: 1.0000 | ORAL_TABLET | ORAL | Status: DC | PRN

## 2013-06-27 MED ORDER — IBUPROFEN 600 MG PO TABS: 600.0000 mg | ORAL_TABLET | Freq: Four times a day (QID) | ORAL | Status: DC | PRN

## 2013-06-27 NOTE — Discharge Summary (Signed)
Obstetric Discharge Summary Reason for Admission: induction of labor Prenatal Procedures: ultrasound Intrapartum Procedures: spontaneous vaginal delivery Postpartum Procedures: none Complications-Operative and Postpartum: none Hemoglobin  Date Value Range Status  06/26/2013 8.8* 12.0 - 15.0 g/dL Final     HCT  Date Value Range Status  06/26/2013 27.4* 36.0 - 46.0 % Final    Physical Exam:  General: alert and no distress Lochia: appropriate Uterine Fundus: firm Incision: healing well DVT Evaluation: No evidence of DVT seen on physical exam.  Discharge Diagnoses: Term Pregnancy-delivered  Discharge Information: Date: 06/27/2013 Activity: pelvic rest Diet: routine Medications: PNV, Ibuprofen, Colace, Iron and Percocet Condition: stable Instructions: refer to practice specific booklet Discharge to: home Follow-up Information   Follow up with MARSHALL,BERNARD A, MD. Schedule an appointment as soon as possible for Gomez visit in 6 weeks.   Specialty:  Obstetrics and Gynecology   Contact information:   [address] [place] [address] [phone]       Newborn Data: Live born female  Birth Weight: 7 lb 12.7 oz (3535 g) APGAR: 8, 9  Home with mother.  Catherine Gomez,Catherine Gomez 06/27/2013, 9:14 AM

## 2013-06-27 NOTE — Progress Notes (Signed)
Post Partum Day 2 Subjective: no complaints  Objective: Blood pressure 107/70, pulse 76, temperature 98 F (36.7 C), temperature source Oral, resp. rate 18, height 5\' 4"  (1.626 m), weight 146 lb (66.225 kg), SpO2 100.00%, unknown if currently breastfeeding.  Physical Exam:  General: alert and no distress Lochia: appropriate Uterine Fundus: firm Incision: healing well DVT Evaluation: No evidence of DVT seen on physical exam.   Recent Labs  06/25/13 1200 06/26/13 0600  HGB 9.3* 8.8*  HCT 29.3* 27.4*    Assessment/Plan: Discharge home   LOS: 2 days   Catherine Gomez A 06/27/2013, 9:09 AM

## 2013-10-05 ENCOUNTER — Emergency Department (HOSPITAL_COMMUNITY)
Admission: EM | Admit: 2013-10-05 | Discharge: 2013-10-05 | Disposition: A | Discharge status: Home / Self Care | Payer: Medicaid Other | Attending: Emergency Medicine | Admitting: Emergency Medicine

## 2013-10-05 ENCOUNTER — Encounter (HOSPITAL_COMMUNITY): Payer: Self-pay | Admitting: Emergency Medicine

## 2013-10-05 DIAGNOSIS — J309 Allergic rhinitis, unspecified: Secondary | ICD-10-CM | POA: Insufficient documentation

## 2013-10-05 DIAGNOSIS — Z3202 Encounter for pregnancy test, result negative: Secondary | ICD-10-CM | POA: Insufficient documentation

## 2013-10-05 DIAGNOSIS — K2970 Gastritis, unspecified, without bleeding: Secondary | ICD-10-CM | POA: Insufficient documentation

## 2013-10-05 LAB — CBC WITH DIFFERENTIAL/PLATELET
Basophils Absolute: 0 10*3/uL (ref 0.0–0.1)
Basophils Relative: 0 % (ref 0–1)
Eosinophils Absolute: 0.7 10*3/uL (ref 0.0–0.7)
Eosinophils Relative: 10 % — ABNORMAL HIGH (ref 0–5)
HCT: 36.6 % (ref 36.0–46.0)
MCH: 25.7 pg — ABNORMAL LOW (ref 26.0–34.0)
MCHC: 31.7 g/dL (ref 30.0–36.0)
MCV: 81.2 fL (ref 78.0–100.0)
Monocytes Absolute: 0.3 10*3/uL (ref 0.1–1.0)
Platelets: 152 10*3/uL (ref 150–400)
RDW: 14.6 % (ref 11.5–15.5)

## 2013-10-05 LAB — COMPREHENSIVE METABOLIC PANEL
AST: 18 U/L (ref 0–37)
Albumin: 4.1 g/dL (ref 3.5–5.2)
CO2: 25 mEq/L (ref 19–32)
Calcium: 9.5 mg/dL (ref 8.4–10.5)
Creatinine, Ser: 0.61 mg/dL (ref 0.50–1.10)
GFR calc non Af Amer: >90 mL/min (ref >90)
Sodium: 138 mEq/L (ref 135–145)
Total Protein: 8.1 g/dL (ref 6.0–8.3)

## 2013-10-05 LAB — POCT PREGNANCY, URINE: Preg Test, Ur: NEGATIVE

## 2013-10-05 MED ORDER — FEXOFENADINE HCL 60 MG PO TABS: 60.0000 mg | ORAL_TABLET | Freq: Two times a day (BID) | ORAL | Status: DC

## 2013-10-05 MED ORDER — OMEPRAZOLE 20 MG PO CPDR: 20.0000 mg | DELAYED_RELEASE_CAPSULE | Freq: Every day | ORAL | Status: DC

## 2013-10-05 MED ORDER — GI COCKTAIL ~~LOC~~
30.0000 mL | Freq: Once | ORAL | Status: AC
  Administered 2013-10-05: 30 mL via ORAL
  Filled 2013-10-05: qty 30

## 2013-10-05 NOTE — ED Provider Notes (Signed)
CSN: [id number]     Arrival date & time 10/05/13  3086 History   First MD Initiated Contact with Patient 10/05/13 [phone]     Chief Complaint  Patient presents with  . Abdominal Pain  . Emesis   (Consider location/radiation/quality/duration/timing/severity/associated sxs/prior Treatment) HPI Comments: Patient is otherwise healthy 22 year old female who is 4 weeks post-partum with second child.  Her husband is interpreting for her.  She reports a 2 week history of episodic epigastric abdominal pain with vomiting.  Reports vomiting x 2 yesterday.  She denies fever, chills, radiation of the pain, hematemesis, constipation, diarrhea.  Husband is concerned that she may be pregnant again.  She denies vaginal discharge, bleeding, dysuria, hematuria.  Patient is a 22 y.o. female presenting with abdominal pain and vomiting. The history is provided by the patient and the spouse. The history is limited by a language barrier. A language interpreter was used (spouse).  Abdominal Pain Pain location:  Epigastric Pain quality: dull and gnawing   Pain quality: not bloating and not burning   Pain radiates to:  Does not radiate Pain severity:  Moderate Onset quality:  Gradual Duration:  2 weeks Timing:  Sporadic Progression:  Worsening Chronicity:  New Context: eating   Context: not previous surgeries, not recent illness, not retching, not sick contacts and not suspicious food intake   Relieved by:  Nothing Worsened by:  Eating Ineffective treatments:  None tried Associated symptoms: nausea and vomiting   Associated symptoms: no anorexia, no chest pain, no constipation, no cough, no diarrhea, no dysuria, no fever, no shortness of breath, no vaginal bleeding and no vaginal discharge   Risk factors: not pregnant   Emesis Associated symptoms: abdominal pain   Associated symptoms: no diarrhea     Past Medical History  Diagnosis Date  . Extended spectrum beta lactamase (ESBL) resistance   .  ONGEXBMW(413.2)    Past Surgical History  Procedure Laterality Date  . No past surgeries     Family History  Problem Relation Age of Onset  . Alcohol abuse Neg Hx   . Arthritis Neg Hx   . Asthma Neg Hx   . Birth defects Neg Hx   . Cancer Neg Hx   . COPD Neg Hx   . Depression Neg Hx   . Diabetes Neg Hx   . Drug abuse Neg Hx   . Early death Neg Hx   . Hearing loss Neg Hx   . Heart disease Neg Hx   . Hyperlipidemia Neg Hx   . Hypertension Neg Hx   . Kidney disease Neg Hx   . Learning disabilities Neg Hx   . Mental illness Neg Hx   . Mental retardation Neg Hx   . Miscarriages / Stillbirths Neg Hx   . Stroke Neg Hx   . Vision loss Neg Hx    History  Substance Use Topics  . Smoking status: Never Smoker   . Smokeless tobacco: Never Used  . Alcohol Use: No   OB History   Grav Para Term Preterm Abortions TAB SAB Ect Mult Living   2 2 2  0 0 0 0 0 0 2     Review of Systems  Constitutional: Negative for fever.  Respiratory: Negative for cough and shortness of breath.   Cardiovascular: Negative for chest pain.  Gastrointestinal: Positive for nausea, vomiting and abdominal pain. Negative for diarrhea, constipation and anorexia.  Genitourinary: Negative for dysuria, vaginal bleeding and vaginal discharge.  All other systems reviewed  and are negative.    Allergies  Review of patient's allergies indicates no known allergies.  Home Medications   Current Outpatient Rx  Name  Route  Sig  Dispense  Refill  . ibuprofen (ADVIL,MOTRIN) 200 MG tablet   Oral   Take 600 mg by mouth every 6 (six) hours as needed.          BP 116/65  Pulse 78  Temp(Src) 97.8 F (36.6 C) (Oral)  Resp 18  Wt 145 lb (65.772 kg)  SpO2 100% Physical Exam  Nursing note and vitals reviewed. Constitutional: She is oriented to person, place, and time. She appears well-developed and well-nourished. No distress.  HENT:  Head: Normocephalic and atraumatic.  Right Ear: External ear normal.  Left  Ear: External ear normal.  Nose: Nose normal.  Mouth/Throat: Oropharynx is clear and moist. No oropharyngeal exudate.  Eyes: Conjunctivae are normal. Pupils are equal, round, and reactive to light. No scleral icterus.  Neck: Normal range of motion. Neck supple.  Cardiovascular: Normal rate, regular rhythm and normal heart sounds.  Exam reveals no gallop and no friction rub.   No murmur heard. Pulmonary/Chest: Effort normal and breath sounds normal. No respiratory distress. She has no wheezes. She has no rales. She exhibits no tenderness.  Abdominal: Soft. Bowel sounds are normal. She exhibits no distension, no pulsatile midline mass and no mass. There is tenderness in the epigastric area. There is no rebound, no guarding, no tenderness at McBurney's point and negative Murphy's sign.    Musculoskeletal: Normal range of motion. She exhibits no edema and no tenderness.  Lymphadenopathy:    She has no cervical adenopathy.  Neurological: She is alert and oriented to person, place, and time. She exhibits normal muscle tone. Coordination normal.  Skin: Skin is warm and dry. No rash noted. No erythema. No pallor.  Psychiatric: She has a normal mood and affect. Her behavior is normal. Judgment and thought content normal.    ED Course  Procedures (including critical care time) Labs Review Labs Reviewed  CBC WITH DIFFERENTIAL  COMPREHENSIVE METABOLIC PANEL  LIPASE, BLOOD  POCT PREGNANCY, URINE   Imaging Review No results found.  EKG Interpretation   None      Results for orders placed during the hospital encounter of 10/05/13  CBC WITH DIFFERENTIAL      Result Value Range   WBC 6.6  4.0 - 10.5 K/uL   RBC 4.51  3.87 - 5.11 MIL/uL   Hemoglobin 11.6 (*) 12.0 - 15.0 g/dL   HCT 16.1  09.6 - 04.5 %   MCV 81.2  78.0 - 100.0 fL   MCH 25.7 (*) 26.0 - 34.0 pg   MCHC 31.7  30.0 - 36.0 g/dL   RDW 40.9  81.1 - 91.4 %   Platelets 152  150 - 400 K/uL   Neutrophils Relative % 44  43 - 77 %    Neutro Abs 2.9  1.7 - 7.7 K/uL   Lymphocytes Relative 42  12 - 46 %   Lymphs Abs 2.7  0.7 - 4.0 K/uL   Monocytes Relative 4  3 - 12 %   Monocytes Absolute 0.3  0.1 - 1.0 K/uL   Eosinophils Relative 10 (*) 0 - 5 %   Eosinophils Absolute 0.7  0.0 - 0.7 K/uL   Basophils Relative 0  0 - 1 %   Basophils Absolute 0.0  0.0 - 0.1 K/uL  COMPREHENSIVE METABOLIC PANEL      Result Value Range  Sodium 138  135 - 145 mEq/L   Potassium 4.0  3.5 - 5.1 mEq/L   Chloride 102  96 - 112 mEq/L   CO2 25  19 - 32 mEq/L   Glucose, Bld 86  70 - 99 mg/dL   BUN 10  6 - 23 mg/dL   Creatinine, Ser 0.45  0.50 - 1.10 mg/dL   Calcium 9.5  8.4 - 40.9 mg/dL   Total Protein 8.1  6.0 - 8.3 g/dL   Albumin 4.1  3.5 - 5.2 g/dL   AST 18  0 - 37 U/L   ALT 17  0 - 35 U/L   Alkaline Phosphatase 96  39 - 117 U/L   Total Bilirubin 0.3  0.3 - 1.2 mg/dL   GFR calc non Af Amer >90  >90 mL/min   GFR calc Af Amer >90  >90 mL/min  LIPASE, BLOOD      Result Value Range   Lipase 20  11 - 59 U/L  POCT PREGNANCY, URINE      Result Value Range   Preg Test, Ur NEGATIVE  NEGATIVE   No results found.    MDM  Gastritis Allergic rhinitis  Patient reports relief of epigastric pain after GI cocktail, doubt surgical emergency, labs reassuring, no RUQ abdominal pain, will place on PPI and patient also requesting medication for allergies.   Izola Price Marisue Humble, PA-C 10/05/13 1229

## 2013-10-05 NOTE — Progress Notes (Signed)
   CARE MANAGEMENT ED NOTE 10/05/2013  Patient:  Catherine Gomez, Catherine Gomez   Account Number:  [id number]  Date Initiated:  10/05/2013  Documentation initiated by:  Edd Arbour  Subjective/Objective Assessment:   22 yr old female medicaid of [place] No pcp listed in EPIC CM spoke with pt and female at bedside who confirm pcp is Francoise Ceo Scanned medicaid card in [facility] without a pcp listed     Subjective/Objective Assessment Detail:     Action/Plan:   EPIC updated   Action/Plan Detail:   Anticipated DC Date:  10/05/2013     Status Recommendation to Physician:   Result of Recommendation:    Other ED Services  Consult Working Plan    DC Planning Services  Other  PCP issues    Choice offered to / List presented to:            Status of service:  Completed, signed off  ED Comments:   ED Comments Detail:

## 2013-10-05 NOTE — ED Notes (Signed)
Patient c/o epigastric pain and intermittent vomitng x 2 weeks. Patient ahs vomited x 2 in the past 24 hours. Patient denies any diarrhea.

## 2013-10-12 NOTE — ED Provider Notes (Signed)
Medical screening examination/treatment/procedure(s) were performed by non-physician practitioner and as supervising physician I was immediately available for consultation/collaboration.  EKG Interpretation   None        Ilya Neely, MD 10/12/13 1712 

## 2013-10-27 ENCOUNTER — Encounter (HOSPITAL_COMMUNITY): Payer: Self-pay | Admitting: Emergency Medicine

## 2013-10-27 ENCOUNTER — Emergency Department (HOSPITAL_COMMUNITY)
Admission: EM | Admit: 2013-10-27 | Discharge: 2013-10-27 | Disposition: A | Discharge status: Home / Self Care | Payer: Medicaid Other | Attending: Emergency Medicine | Admitting: Emergency Medicine

## 2013-10-27 DIAGNOSIS — J029 Acute pharyngitis, unspecified: Secondary | ICD-10-CM | POA: Insufficient documentation

## 2013-10-27 DIAGNOSIS — J069 Acute upper respiratory infection, unspecified: Secondary | ICD-10-CM

## 2013-10-27 DIAGNOSIS — Z1619 Resistance to other specified beta lactam antibiotics: Secondary | ICD-10-CM | POA: Insufficient documentation

## 2013-10-27 DIAGNOSIS — R059 Cough, unspecified: Secondary | ICD-10-CM | POA: Insufficient documentation

## 2013-10-27 DIAGNOSIS — R51 Headache: Secondary | ICD-10-CM | POA: Insufficient documentation

## 2013-10-27 DIAGNOSIS — R1110 Vomiting, unspecified: Secondary | ICD-10-CM | POA: Insufficient documentation

## 2013-10-27 DIAGNOSIS — H9209 Otalgia, unspecified ear: Secondary | ICD-10-CM | POA: Insufficient documentation

## 2013-10-27 DIAGNOSIS — IMO0001 Reserved for inherently not codable concepts without codable children: Secondary | ICD-10-CM | POA: Insufficient documentation

## 2013-10-27 DIAGNOSIS — J3489 Other specified disorders of nose and nasal sinuses: Secondary | ICD-10-CM | POA: Insufficient documentation

## 2013-10-27 DIAGNOSIS — R05 Cough: Secondary | ICD-10-CM | POA: Insufficient documentation

## 2013-10-27 LAB — RAPID STREP SCREEN (MED CTR MEBANE ONLY): Streptococcus, Group A Screen (Direct): NEGATIVE

## 2013-10-27 MED ORDER — GUAIFENESIN 100 MG/5ML PO LIQD: 100.0000 mg | ORAL | Status: DC | PRN

## 2013-10-27 MED ORDER — HYDROCODONE-HOMATROPINE 5-1.5 MG/5ML PO SYRP: 5.0000 mL | ORAL_SOLUTION | Freq: Four times a day (QID) | ORAL | Status: DC | PRN

## 2013-10-27 NOTE — ED Notes (Signed)
Pt report 2 day hx of cough, fever, sinus drainage, throat pain and upper back pain

## 2013-10-27 NOTE — Discharge Instructions (Signed)

## 2013-10-27 NOTE — ED Provider Notes (Signed)
CSN: [id number]     Arrival date & time 10/27/13  1022 History   First MD Initiated Contact with Patient 10/27/13 1113     Chief Complaint  Patient presents with  . Otalgia    r/ear pain x 2 days  . Facial Pain  . Cough  . Fever    101 -yesterday   (Consider location/radiation/quality/duration/timing/severity/associated sxs/prior Treatment) HPI  23 year old female presents for evaluations of flulike symptoms. Patient reports gradual onset of fever, headache, ear pain, runny nose, sore throat, sneezing, nonproductive cough, body aches, and vomited once. Fever as high as 101. Symptom has been ongoing for the past 3 days. No specific treatment tried. Denies trouble swallowing, neck stiffness chest pain or shortness of breath, abdominal pain, dysuria, or rash. No recent travel, is up-to-date with immunization. No other specific complaint.  Past Medical History  Diagnosis Date  . Extended spectrum beta lactamase (ESBL) resistance   . OZHYQMVH(846.9Headache(784.0)    Past Surgical History  Procedure Laterality Date  . No past surgeries     Family History  Problem Relation Age of Onset  . Alcohol abuse Neg Hx   . Arthritis Neg Hx   . Asthma Neg Hx   . Birth defects Neg Hx   . Cancer Neg Hx   . COPD Neg Hx   . Depression Neg Hx   . Diabetes Neg Hx   . Drug abuse Neg Hx   . Early death Neg Hx   . Hearing loss Neg Hx   . Heart disease Neg Hx   . Hyperlipidemia Neg Hx   . Hypertension Neg Hx   . Kidney disease Neg Hx   . Learning disabilities Neg Hx   . Mental illness Neg Hx   . Mental retardation Neg Hx   . Miscarriages / Stillbirths Neg Hx   . Stroke Neg Hx   . Vision loss Neg Hx    History  Substance Use Topics  . Smoking status: Never Smoker   . Smokeless tobacco: Never Used  . Alcohol Use: No   OB History   Grav Para Term Preterm Abortions TAB SAB Ect Mult Living   2 2 2  0 0 0 0 0 0 2     Review of Systems  Constitutional: Positive for fever.  HENT: Positive for rhinorrhea,  sneezing and sore throat.   Respiratory: Positive for cough.   Skin: Negative for rash.  All other systems reviewed and are negative.    Allergies  Review of patient's allergies indicates no known allergies.  Home Medications   Current Outpatient Rx  Name  Route  Sig  Dispense  Refill  . fexofenadine (ALLEGRA) 60 MG tablet   Oral   Take 1 tablet (60 mg total) by mouth 2 (two) times daily.   60 tablet   0   . ibuprofen (ADVIL,MOTRIN) 200 MG tablet   Oral   Take 600 mg by mouth every 6 (six) hours as needed.         Marland Kitchen. omeprazole (PRILOSEC) 20 MG capsule   Oral   Take 1 capsule (20 mg total) by mouth daily.   30 capsule   0    There were no vitals taken for this visit. Physical Exam  Nursing note and vitals reviewed. Constitutional: She appears well-developed and well-nourished. No distress.  HENT:  Head: Atraumatic.  Ear: TM normal bilaterally, no external ear tenderness  Nose: normal appearance  Throat: uvula midline, mild tonsillar enlargement bilaterally without exudates.  Mild  postoropharyngeal erythema.  No evidence of PTA or deep tissue infection.  No trismus  Eyes: Conjunctivae are normal.  Neck: Normal range of motion. Neck supple.  No nuchal rigidity  Cardiovascular: Normal rate and regular rhythm.   Pulmonary/Chest: Effort normal and breath sounds normal. She has no wheezes. She has no rales.  Abdominal: Soft. Bowel sounds are normal. There is no tenderness.  Lymphadenopathy:    She has no cervical adenopathy.  Neurological: She is alert.  Skin: No rash noted.  Psychiatric: She has a normal mood and affect.    ED Course  Procedures (including critical care time)  11:28 AM Pt with URI sxs.  Afebrile with stable normal vital sign. No hypoxia to suggest pna.    12:11 PM Rapid strep test neg.  Stable for discharge with cough medication and antiexpectorant  Labs Review Labs Reviewed  RAPID STREP SCREEN  CULTURE, GROUP A STREP   Imaging  Review No results found.  EKG Interpretation   None       MDM   1. URI (upper respiratory infection)    BP 110/68  Pulse 84  Temp(Src) 98.5 F (36.9 C) (Oral)  Resp 20  SpO2 100%  Breastfeeding? Yes     Fayrene Helper, PA-C 10/27/13 1211

## 2013-10-27 NOTE — ED Provider Notes (Signed)
Medical screening examination/treatment/procedure(s) were performed by non-physician practitioner and as supervising physician I was immediately available for consultation/collaboration.  EKG Interpretation   None         Shanna CiscoMegan E Dilyn Osoria, MD 10/27/13 [phone]

## 2013-10-29 LAB — CULTURE, GROUP A STREP

## 2014-03-18 ENCOUNTER — Emergency Department (HOSPITAL_COMMUNITY)
Admission: EM | Admit: 2014-03-18 | Discharge: 2014-03-18 | Disposition: A | Discharge status: Home / Self Care | Payer: Medicaid Other | Attending: Emergency Medicine | Admitting: Emergency Medicine

## 2014-03-18 ENCOUNTER — Encounter (HOSPITAL_COMMUNITY): Payer: Self-pay | Admitting: Emergency Medicine

## 2014-03-18 DIAGNOSIS — R51 Headache: Secondary | ICD-10-CM | POA: Insufficient documentation

## 2014-03-18 DIAGNOSIS — N390 Urinary tract infection, site not specified: Secondary | ICD-10-CM | POA: Insufficient documentation

## 2014-03-18 DIAGNOSIS — R519 Headache, unspecified: Secondary | ICD-10-CM

## 2014-03-18 DIAGNOSIS — H53149 Visual discomfort, unspecified: Secondary | ICD-10-CM | POA: Insufficient documentation

## 2014-03-18 DIAGNOSIS — R509 Fever, unspecified: Secondary | ICD-10-CM | POA: Insufficient documentation

## 2014-03-18 DIAGNOSIS — R42 Dizziness and giddiness: Secondary | ICD-10-CM | POA: Insufficient documentation

## 2014-03-18 DIAGNOSIS — R112 Nausea with vomiting, unspecified: Secondary | ICD-10-CM | POA: Insufficient documentation

## 2014-03-18 DIAGNOSIS — Z3202 Encounter for pregnancy test, result negative: Secondary | ICD-10-CM | POA: Insufficient documentation

## 2014-03-18 LAB — URINALYSIS, ROUTINE W REFLEX MICROSCOPIC
Bilirubin Urine: NEGATIVE
Glucose, UA: NEGATIVE mg/dL
Hgb urine dipstick: NEGATIVE
KETONES UR: NEGATIVE mg/dL
NITRITE: NEGATIVE
PH: 6.5 (ref 5.0–8.0)
Protein, ur: NEGATIVE mg/dL
SPECIFIC GRAVITY, URINE: 1.035 — AB (ref 1.005–1.030)
UROBILINOGEN UA: 0.2 mg/dL (ref 0.0–1.0)

## 2014-03-18 LAB — PREGNANCY, URINE: PREG TEST UR: NEGATIVE

## 2014-03-18 LAB — URINE MICROSCOPIC-ADD ON

## 2014-03-18 MED ORDER — PROCHLORPERAZINE EDISYLATE 5 MG/ML IJ SOLN
10.0000 mg | Freq: Once | INTRAMUSCULAR | Status: AC
  Administered 2014-03-18: 10 mg via INTRAMUSCULAR
  Filled 2014-03-18: qty 2

## 2014-03-18 MED ORDER — CEPHALEXIN 500 MG PO CAPS: 500.0000 mg | ORAL_CAPSULE | Freq: Two times a day (BID) | ORAL | Status: DC

## 2014-03-18 MED ORDER — DIPHENHYDRAMINE HCL 50 MG/ML IJ SOLN
25.0000 mg | Freq: Once | INTRAMUSCULAR | Status: AC
  Administered 2014-03-18: 25 mg via INTRAMUSCULAR
  Filled 2014-03-18: qty 1

## 2014-03-18 MED ORDER — KETOROLAC TROMETHAMINE 30 MG/ML IJ SOLN
30.0000 mg | Freq: Once | INTRAMUSCULAR | Status: AC
  Administered 2014-03-18: 30 mg via INTRAMUSCULAR
  Filled 2014-03-18: qty 1

## 2014-03-18 MED ORDER — METOCLOPRAMIDE HCL 5 MG/ML IJ SOLN
10.0000 mg | Freq: Once | INTRAMUSCULAR | Status: AC
  Administered 2014-03-18: 10 mg via INTRAMUSCULAR
  Filled 2014-03-18: qty 2

## 2014-03-18 NOTE — Discharge Instructions (Signed)
Please follow up with your primary care physician in 1-2 days. If you do not have one please call the [facility] number listed above. Please take your antibiotic until completion. Please read all discharge instructions and return precautions.   Urinary Tract Infection Urinary tract infections (UTIs) can develop anywhere along your urinary tract. Your urinary tract is your body's drainage system for removing wastes and extra water. Your urinary tract includes two kidneys, two ureters, a bladder, and a urethra. Your kidneys are a pair of bean-shaped organs. Each kidney is about the size of your fist. They are located below your ribs, one on each side of your spine. CAUSES Infections are caused by microbes, which are microscopic organisms, including fungi, viruses, and bacteria. These organisms are so small that they can only be seen through a microscope. Bacteria are the microbes that most commonly cause UTIs. SYMPTOMS  Symptoms of UTIs may vary by age and gender of the patient and by the location of the infection. Symptoms in young women typically include a frequent and intense urge to urinate and a painful, burning feeling in the bladder or urethra during urination. Older women and men are more likely to be tired, shaky, and weak and have muscle aches and abdominal pain. A fever may mean the infection is in your kidneys. Other symptoms of a kidney infection include pain in your back or sides below the ribs, nausea, and vomiting. DIAGNOSIS To diagnose a UTI, your caregiver will ask you about your symptoms. Your caregiver also will ask to provide a urine sample. The urine sample will be tested for bacteria and white blood cells. White blood cells are made by your body to help fight infection. TREATMENT  Typically, UTIs can be treated with medication. Because most UTIs are caused by a bacterial infection, they usually can be treated with the use of antibiotics. The choice of antibiotic  and length of treatment depend on your symptoms and the type of bacteria causing your infection. HOME CARE INSTRUCTIONS  If you were prescribed antibiotics, take them exactly as your caregiver instructs you. Finish the medication even if you feel better after you have only taken some of the medication.  Drink enough water and fluids to keep your urine clear or pale yellow.  Avoid caffeine, tea, and carbonated beverages. They tend to irritate your bladder.  Empty your bladder often. Avoid holding urine for long periods of time.  Empty your bladder before and after sexual intercourse.  After a bowel movement, women should cleanse from front to back. Use each tissue only once. SEEK MEDICAL CARE IF:   You have back pain.  You develop a fever.  Your symptoms do not begin to resolve within 3 days. SEEK IMMEDIATE MEDICAL CARE IF:   You have severe back pain or lower abdominal pain.  You develop chills.  You have nausea or vomiting.  You have continued burning or discomfort with urination. MAKE SURE YOU:   Understand these instructions.  Will watch your condition.  Will get help right away if you are not doing well or get worse. Document Released: 07/14/2005 Document Revised: 04/04/2012 Document Reviewed: 11/12/2011 [facility] Patient Information 2014 [place], [place].  Headaches, Frequently Asked Questions MIGRAINE HEADACHES Q: What is migraine? What causes it? How can I treat it? A: Generally, migraine headaches begin as a dull ache. Then they develop into a constant, throbbing, and pulsating pain. You may experience pain at the temples. You may experience pain at the front  or back of one or both sides of the head. The pain is usually accompanied by a combination of:  Nausea.  Vomiting.  Sensitivity to light and noise. Some people (about 15%) experience an aura (see below) before an attack. The cause of migraine is believed to be chemical reactions in the brain. Treatment for  migraine may include over-the-counter or prescription medications. It may also include self-help techniques. These include relaxation training and biofeedback.  Q: What is an aura? A: About 15% of people with migraine get an "aura". This is a sign of neurological symptoms that occur before a migraine headache. You may see wavy or jagged lines, dots, or flashing lights. You might experience tunnel vision or blind spots in one or both eyes. The aura can include visual or auditory hallucinations (something imagined). It may include disruptions in smell (such as strange odors), taste or touch. Other symptoms include:  Numbness.  A "pins and needles" sensation.  Difficulty in recalling or speaking the correct word. These neurological events may last as long as 60 minutes. These symptoms will fade as the headache begins. Q: What is a trigger? A: Certain physical or environmental factors can lead to or "trigger" a migraine. These include:  Foods.  Hormonal changes.  Weather.  Stress. It is important to remember that triggers are different for everyone. To help prevent migraine attacks, you need to figure out which triggers affect you. Keep a headache diary. This is a good way to track triggers. The diary will help you talk to your healthcare professional about your condition. Q: Does weather affect migraines? A: Bright sunshine, hot, humid conditions, and drastic changes in barometric pressure may lead to, or "trigger," a migraine attack in some people. But studies have shown that weather does not act as a trigger for everyone with migraines. Q: What is the link between migraine and hormones? A: Hormones start and regulate many of your body's functions. Hormones keep your body in balance within a constantly changing environment. The levels of hormones in your body are unbalanced at times. Examples are during menstruation, pregnancy, or menopause. That can lead to a migraine attack. In fact, about  three quarters of all women with migraine report that their attacks are related to the menstrual cycle.  Q: Is there an increased risk of stroke for migraine sufferers? A: The likelihood of a migraine attack causing a stroke is very remote. That is not to say that migraine sufferers cannot have a stroke associated with their migraines. In persons under age 21, the most common associated factor for stroke is migraine headache. But over the course of a person's normal life span, the occurrence of migraine headache may actually be associated with a reduced risk of dying from cerebrovascular disease due to stroke.  Q: What are acute medications for migraine? A: Acute medications are used to treat the pain of the headache after it has started. Examples over-the-counter medications, NSAIDs, ergots, and triptans.  Q: What are the triptans? A: Triptans are the newest class of abortive medications. They are specifically targeted to treat migraine. Triptans are vasoconstrictors. They moderate some chemical reactions in the brain. The triptans work on receptors in your brain. Triptans help to restore the balance of a neurotransmitter called serotonin. Fluctuations in levels of serotonin are thought to be a main cause of migraine.  Q: Are over-the-counter medications for migraine effective? A: Over-the-counter, or "OTC," medications may be effective in relieving mild to moderate pain and associated symptoms of migraine.  But you should see your caregiver before beginning any treatment regimen for migraine.  Q: What are preventive medications for migraine? A: Preventive medications for migraine are sometimes referred to as "prophylactic" treatments. They are used to reduce the frequency, severity, and length of migraine attacks. Examples of preventive medications include antiepileptic medications, antidepressants, beta-blockers, calcium channel blockers, and NSAIDs (nonsteroidal anti-inflammatory drugs). Q: Why are  anticonvulsants used to treat migraine? A: During the past few years, there has been an increased interest in antiepileptic drugs for the prevention of migraine. They are sometimes referred to as "anticonvulsants". Both epilepsy and migraine may be caused by similar reactions in the brain.  Q: Why are antidepressants used to treat migraine? A: Antidepressants are typically used to treat people with depression. They may reduce migraine frequency by regulating chemical levels, such as serotonin, in the brain.  Q: What alternative therapies are used to treat migraine? A: The term "alternative therapies" is often used to describe treatments considered outside the scope of conventional Western medicine. Examples of alternative therapy include acupuncture, acupressure, and yoga. Another common alternative treatment is herbal therapy. Some herbs are believed to relieve headache pain. Always discuss alternative therapies with your caregiver before proceeding. Some herbal products contain arsenic and other toxins. TENSION HEADACHES Q: What is a tension-type headache? What causes it? How can I treat it? A: Tension-type headaches occur randomly. They are often the result of temporary stress, anxiety, fatigue, or anger. Symptoms include soreness in your temples, a tightening band-like sensation around your head (a "vice-like" ache). Symptoms can also include a pulling feeling, pressure sensations, and contracting head and neck muscles. The headache begins in your forehead, temples, or the back of your head and neck. Treatment for tension-type headache may include over-the-counter or prescription medications. Treatment may also include self-help techniques such as relaxation training and biofeedback. CLUSTER HEADACHES Q: What is a cluster headache? What causes it? How can I treat it? A: Cluster headache gets its name because the attacks come in groups. The pain arrives with little, if any, warning. It is usually on  one side of the head. A tearing or bloodshot eye and a runny nose on the same side of the headache may also accompany the pain. Cluster headaches are believed to be caused by chemical reactions in the brain. They have been described as the most severe and intense of any headache type. Treatment for cluster headache includes prescription medication and oxygen. SINUS HEADACHES Q: What is a sinus headache? What causes it? How can I treat it? A: When a cavity in the bones of the face and skull (a sinus) becomes inflamed, the inflammation will cause localized pain. This condition is usually the result of an allergic reaction, a tumor, or an infection. If your headache is caused by a sinus blockage, such as an infection, you will probably have a fever. An x-ray will confirm a sinus blockage. Your caregiver's treatment might include antibiotics for the infection, as well as antihistamines or decongestants.  REBOUND HEADACHES Q: What is a rebound headache? What causes it? How can I treat it? A: A pattern of taking acute headache medications too often can lead to a condition known as "rebound headache." A pattern of taking too much headache medication includes taking it more than 2 days per week or in excessive amounts. That means more than the label or a caregiver advises. With rebound headaches, your medications not only stop relieving pain, they actually begin to cause headaches. Doctors treat rebound  headache by tapering the medication that is being overused. Sometimes your caregiver will gradually substitute a different type of treatment or medication. Stopping may be a challenge. Regularly overusing a medication increases the potential for serious side effects. Consult a caregiver if you regularly use headache medications more than 2 days per week or more than the label advises. ADDITIONAL QUESTIONS AND ANSWERS Q: What is biofeedback? A: Biofeedback is a self-help treatment. Biofeedback uses special equipment  to monitor your body's involuntary physical responses. Biofeedback monitors:  Breathing.  Pulse.  Heart rate.  Temperature.  Muscle tension.  Brain activity. Biofeedback helps you refine and perfect your relaxation exercises. You learn to control the physical responses that are related to stress. Once the technique has been mastered, you do not need the equipment any more. Q: Are headaches hereditary? A: Four out of five (80%) of people that suffer report a family history of migraine. Scientists are not sure if this is genetic or a family predisposition. Despite the uncertainty, a child has a 50% chance of having migraine if one parent suffers. The child has a 75% chance if both parents suffer.  Q: Can children get headaches? A: By the time they reach high school, most young people have experienced some type of headache. Many safe and effective approaches or medications can prevent a headache from occurring or stop it after it has begun.  Q: What type of doctor should I see to diagnose and treat my headache? A: Start with your primary caregiver. Discuss his or her experience and approach to headaches. Discuss methods of classification, diagnosis, and treatment. Your caregiver may decide to recommend you to a headache specialist, depending upon your symptoms or other physical conditions. Having diabetes, allergies, etc., may require a more comprehensive and inclusive approach to your headache. The National Headache Foundation will provide, upon request, a list of [facility] physician members in your state. Document Released: 12/25/2003 Document Revised: 12/27/2011 Document Reviewed: 06/03/2008 [facility] Patient Information 2014 [place], [place].

## 2014-03-18 NOTE — ED Provider Notes (Signed)
CSN: [id number]     Arrival date & time 03/18/14  1832 History   First MD Initiated Contact with Patient 03/18/14 2014     Chief Complaint  Patient presents with  . Headache     (Consider location/radiation/quality/duration/timing/severity/associated sxs/prior Treatment) HPI Comments: The patient is a 23 year old female past medical history significant for headaches presented to the emergency department for right sided throbbing headache with associated nausea, vomiting, photophobia, dizziness for the last month. Patient states she's had intermittent subjective fevers. Alleviating factors: none. Aggravating factors: light, sound. Medications tried prior to arrival: Motrin, ibuprofen. Patient states she's had similar history of headaches before her, most notably last pregnancy. Patient states she has had some suprapubic discomfort, urinary frequency and urgency. No abdominal surgical history.    The history is provided by the patient. The history is limited by a language barrier. A language interpreter was used.    Past Medical History  Diagnosis Date  . Extended spectrum beta lactamase (ESBL) resistance   . [id number](478.2Headache(784.0)    Past Surgical History  Procedure Laterality Date  . No past surgeries     Family History  Problem Relation Age of Onset  . Alcohol abuse Neg Hx   . Arthritis Neg Hx   . Asthma Neg Hx   . Birth defects Neg Hx   . Cancer Neg Hx   . COPD Neg Hx   . Depression Neg Hx   . Diabetes Neg Hx   . Drug abuse Neg Hx   . Early death Neg Hx   . Hearing loss Neg Hx   . Heart disease Neg Hx   . Hyperlipidemia Neg Hx   . Hypertension Neg Hx   . Kidney disease Neg Hx   . Learning disabilities Neg Hx   . Mental illness Neg Hx   . Mental retardation Neg Hx   . Miscarriages / Stillbirths Neg Hx   . Stroke Neg Hx   . Vision loss Neg Hx    History  Substance Use Topics  . Smoking status: Never Smoker   . Smokeless tobacco: Never Used  . Alcohol Use: No   OB  History   Grav Para Term Preterm Abortions TAB SAB Ect Mult Living   2 2 2  0 0 0 0 0 0 2     Review of Systems  Constitutional: Positive for fever (Subject).  Eyes: Positive for photophobia.  Respiratory: Negative for shortness of breath.   Cardiovascular: Negative for chest pain.  Gastrointestinal: Positive for nausea and vomiting. Negative for abdominal pain and diarrhea.  Genitourinary: Positive for dysuria and urgency. Negative for flank pain.  Skin: Negative for rash.  Neurological: Positive for dizziness and headaches. Negative for syncope.  All other systems reviewed and are negative.     Allergies  Review of patient's allergies indicates no known allergies.  Home Medications   Prior to Admission medications   Medication Sig Start Date End Date Taking? Authorizing Provider  Iron-FA-B Cmp-C-Biot-Probiotic (FUSION PLUS) CAPS Take 1 tablet by mouth daily.   Yes Historical Provider, MD   BP 101/68  Pulse 74  Temp(Src) 98 F (36.7 C) (Oral)  Resp 16  SpO2 100%  LMP 03/11/2014 Physical Exam  Nursing note and vitals reviewed. Constitutional: She is oriented to person, place, and time. She appears well-developed and well-nourished. No distress.  HENT:  Head: Normocephalic and atraumatic.  Right Ear: External ear normal.  Left Ear: External ear normal.  Nose: Nose normal.  Mouth/Throat: Oropharynx is clear  and moist. No oropharyngeal exudate.  Eyes: Conjunctivae and EOM are normal. Pupils are equal, round, and reactive to light.  Neck: Normal range of motion. Neck supple.  Cardiovascular: Normal rate, regular rhythm, normal heart sounds and intact distal pulses.   Pulmonary/Chest: Effort normal and breath sounds normal. No respiratory distress.  Abdominal: Soft. Bowel sounds are normal. She exhibits no distension. There is no tenderness. There is no rigidity, no rebound, no guarding and no CVA tenderness.  Musculoskeletal: She exhibits no edema.  Moves all extremities  without ataxia.  Neurological: She is alert and oriented to person, place, and time. She has normal strength. No cranial nerve deficit. Gait normal. GCS eye subscore is 4. GCS verbal subscore is 5. GCS motor subscore is 6.  Sensation grossly intact.  No pronator drift.  Bilateral heel-knee-shin intact.  Skin: Skin is warm and dry. No rash noted. She is not diaphoretic.  Psychiatric: Her speech is normal.    ED Course  Procedures (including critical care time) Medications  metoCLOPramide (REGLAN) injection 10 mg (10 mg Intramuscular Given 03/18/14 2141)  diphenhydrAMINE (BENADRYL) injection 25 mg (25 mg Intramuscular Given 03/18/14 2141)  prochlorperazine (COMPAZINE) injection 10 mg (10 mg Intramuscular Given 03/18/14 2304)  ketorolac (TORADOL) 30 MG/ML injection 30 mg (30 mg Intramuscular Given 03/18/14 2304)    Labs Review Labs Reviewed  URINALYSIS, ROUTINE W REFLEX MICROSCOPIC - Abnormal; Notable for the following:    APPearance CLOUDY (*)    Specific Gravity, Urine 1.035 (*)    Leukocytes, UA TRACE (*)    All other components within normal limits  PREGNANCY, URINE  URINE MICROSCOPIC-ADD ON    Imaging Review No results found.   EKG Interpretation None      MDM   Final diagnoses:  Headache  UTI (lower urinary tract infection)    Filed Vitals:   03/18/14 2313  BP: 101/68  Pulse: 74  Temp: 98 F (36.7 C)  Resp: 16   Afebrile, NAD, non-toxic appearing, AAOx4.   1) Headache: Pt HA treated and improved while in ED.  Presentation is like pts typical HA and non concerning for [facility], ICH, Meningitis, or temporal arteritis. Pt is afebrile with no focal neuro deficits, nuchal rigidity, or change in vision. Pt is to follow up with PCP to discuss prophylactic medication. Pt verbalizes understanding and is agreeable with plan to dc.   2) UTI: Pt has been diagnosed with a UTI. Pt is afebrile, no CVA tenderness, normotensive, and denies N/V. Pt to be dc home with antibiotics and  instructions to follow up with PCP if symptoms persist.  Return precautions discussed. Patient is agreeable to plan. Patient is stable at time of discharge     Jeannetta Ellis, PA-C 03/19/14 0240

## 2014-03-18 NOTE — ED Notes (Signed)
Pt speaks the language Hausa. Interpreter line was utilized. N/V dizziness and fever and headache x 1 month. Had headaches before when sh e was pregnant.

## 2014-03-19 ENCOUNTER — Emergency Department (HOSPITAL_COMMUNITY)
Admission: EM | Admit: 2014-03-19 | Discharge: 2014-03-20 | Disposition: A | Discharge status: Home / Self Care | Payer: Medicaid Other | Attending: Emergency Medicine | Admitting: Emergency Medicine

## 2014-03-19 ENCOUNTER — Encounter (HOSPITAL_COMMUNITY): Payer: Self-pay | Admitting: Emergency Medicine

## 2014-03-19 DIAGNOSIS — R259 Unspecified abnormal involuntary movements: Secondary | ICD-10-CM | POA: Insufficient documentation

## 2014-03-19 DIAGNOSIS — R51 Headache: Secondary | ICD-10-CM | POA: Insufficient documentation

## 2014-03-19 DIAGNOSIS — K1379 Other lesions of oral mucosa: Secondary | ICD-10-CM

## 2014-03-19 DIAGNOSIS — Z792 Long term (current) use of antibiotics: Secondary | ICD-10-CM | POA: Insufficient documentation

## 2014-03-19 DIAGNOSIS — R42 Dizziness and giddiness: Secondary | ICD-10-CM | POA: Insufficient documentation

## 2014-03-19 DIAGNOSIS — K1370 Unspecified lesions of oral mucosa: Secondary | ICD-10-CM | POA: Insufficient documentation

## 2014-03-19 DIAGNOSIS — R112 Nausea with vomiting, unspecified: Secondary | ICD-10-CM | POA: Insufficient documentation

## 2014-03-19 NOTE — ED Notes (Addendum)
Pt's husband reports pt seen for dental pain and headache yesterday, was given 4 shots and discharged with prescription for antibiotic. Husband reports that after pt returned from hospital, swelling and pain worsened and pt unable to talk. Airway is patent.

## 2014-03-20 LAB — COMPREHENSIVE METABOLIC PANEL
ALT: 16 U/L (ref 0–35)
AST: 17 U/L (ref 0–37)
Albumin: 4.7 g/dL (ref 3.5–5.2)
Alkaline Phosphatase: 94 U/L (ref 39–117)
BUN: 12 mg/dL (ref 6–23)
CO2: 20 meq/L (ref 19–32)
Calcium: 9.5 mg/dL (ref 8.4–10.5)
Chloride: 101 mEq/L (ref 96–112)
Creatinine, Ser: 0.57 mg/dL (ref 0.50–1.10)
GLUCOSE: 131 mg/dL — AB (ref 70–99)
POTASSIUM: 3.6 meq/L — AB (ref 3.7–5.3)
SODIUM: 137 meq/L (ref 137–147)
Total Bilirubin: <0.2 mg/dL — ABNORMAL LOW (ref 0.3–1.2)
Total Protein: 8.8 g/dL — ABNORMAL HIGH (ref 6.0–8.3)

## 2014-03-20 LAB — CBC WITH DIFFERENTIAL/PLATELET
BASOS ABS: 0 10*3/uL (ref 0.0–0.1)
Basophils Relative: 0 % (ref 0–1)
EOS ABS: 0 10*3/uL (ref 0.0–0.7)
EOS PCT: 0 % (ref 0–5)
HCT: 33.5 % — ABNORMAL LOW (ref 36.0–46.0)
Hemoglobin: 10.6 g/dL — ABNORMAL LOW (ref 12.0–15.0)
LYMPHS ABS: 1.3 10*3/uL (ref 0.7–4.0)
Lymphocytes Relative: 12 % (ref 12–46)
MCH: 25.7 pg — AB (ref 26.0–34.0)
MCHC: 31.6 g/dL (ref 30.0–36.0)
MCV: 81.3 fL (ref 78.0–100.0)
Monocytes Absolute: 0.4 10*3/uL (ref 0.1–1.0)
Monocytes Relative: 4 % (ref 3–12)
Neutro Abs: 8.9 10*3/uL — ABNORMAL HIGH (ref 1.7–7.7)
Neutrophils Relative %: 84 % — ABNORMAL HIGH (ref 43–77)
Platelets: 185 10*3/uL (ref 150–400)
RBC: 4.12 MIL/uL (ref 3.87–5.11)
RDW: 13.1 % (ref 11.5–15.5)
WBC: 10.6 10*3/uL — ABNORMAL HIGH (ref 4.0–10.5)

## 2014-03-20 LAB — URINE MICROSCOPIC-ADD ON

## 2014-03-20 LAB — URINALYSIS, ROUTINE W REFLEX MICROSCOPIC
BILIRUBIN URINE: NEGATIVE
Glucose, UA: NEGATIVE mg/dL
Hgb urine dipstick: NEGATIVE
Ketones, ur: NEGATIVE mg/dL
Nitrite: NEGATIVE
PROTEIN: NEGATIVE mg/dL
Specific Gravity, Urine: 1.014 (ref 1.005–1.030)
UROBILINOGEN UA: 0.2 mg/dL (ref 0.0–1.0)
pH: 5.5 (ref 5.0–8.0)

## 2014-03-20 LAB — CBG MONITORING, ED: Glucose-Capillary: 130 mg/dL — ABNORMAL HIGH (ref 70–99)

## 2014-03-20 MED ORDER — ONDANSETRON 4 MG PO TBDP
4.0000 mg | ORAL_TABLET | Freq: Once | ORAL | Status: AC
  Administered 2014-03-20: 4 mg via ORAL
  Filled 2014-03-20: qty 1

## 2014-03-20 MED ORDER — ONDANSETRON 8 MG PO TBDP: ORAL_TABLET | ORAL | Status: DC

## 2014-03-20 MED ORDER — FAMOTIDINE 20 MG PO TABS
20.0000 mg | ORAL_TABLET | Freq: Two times a day (BID) | ORAL | Status: DC
Start: 2014-03-20 — End: 2014-04-07

## 2014-03-20 MED ORDER — POTASSIUM CHLORIDE CRYS ER 20 MEQ PO TBCR
40.0000 meq | EXTENDED_RELEASE_TABLET | Freq: Once | ORAL | Status: AC
  Administered 2014-03-20: 40 meq via ORAL
  Filled 2014-03-20: qty 2

## 2014-03-20 MED ORDER — THERA VITAL M PO TABS: 1.0000 | ORAL_TABLET | Freq: Every day | ORAL | Status: DC

## 2014-03-20 MED ORDER — HYDROCODONE-ACETAMINOPHEN 5-325 MG PO TABS: 1.0000 | ORAL_TABLET | Freq: Once | ORAL | Status: DC

## 2014-03-20 NOTE — Discharge Instructions (Signed)
Please followup with a dentist for continued evaluation of your mouth and tongue pain. Please also followup with your primary care provider for continued evaluation and treatment.    Dental Care and Dentist Visits Dental care supports good overall health. Regular dental visits can also help you avoid dental pain, bleeding, infection, and other more serious health problems in the future. It is important to keep the mouth healthy because diseases in the teeth, gums, and other oral tissues can spread to other areas of the body. Some problems, such as diabetes, heart disease, and pre-term labor have been associated with poor oral health.  See your dentist every 6 months. If you experience emergency problems such as a toothache or broken tooth, go to the dentist right away. If you see your dentist regularly, you may catch problems early. It is easier to be treated for problems in the early stages.  WHAT TO EXPECT AT A DENTIST VISIT  Your dentist will look for many common oral health problems and recommend proper treatment. At your regular dental visit, you can expect:  Gentle cleaning of the teeth and gums. This includes scraping and polishing. This helps to remove the sticky substance around the teeth and gums (plaque). Plaque forms in the mouth shortly after eating. Over time, plaque hardens on the teeth as tartar. If tartar is not removed regularly, it can cause problems. Cleaning also helps remove stains.  Periodic X-rays. These pictures of the teeth and supporting bone will help your dentist assess the health of your teeth.  Periodic fluoride treatments. Fluoride is a natural mineral shown to help strengthen teeth. Fluoride treatmentinvolves applying a fluoride gel or varnish to the teeth. It is most commonly done in children.  Examination of the mouth, tongue, jaws, teeth, and gums to look for any oral health problems, such as:  Cavities (dental caries). This is decay on the tooth caused by  plaque, sugar, and acid in the mouth. It is best to catch a cavity when it is small.  Inflammation of the gums caused by plaque buildup (gingivitis).  Problems with the mouth or malformed or misaligned teeth.  Oral cancer or other diseases of the soft tissues or jaws. KEEP YOUR TEETH AND GUMS HEALTHY For healthy teeth and gums, follow these general guidelines as well as your dentist's specific advice:  Have your teeth professionally cleaned at the dentist every 6 months.  Brush twice daily with a fluoride toothpaste.  Floss your teeth daily.  Ask your dentist if you need fluoride supplements, treatments, or fluoride toothpaste.  Eat a healthy diet. Reduce foods and drinks with added sugar.  Avoid smoking. TREATMENT FOR ORAL HEALTH PROBLEMS If you have oral health problems, treatment varies depending on the conditions present in your teeth and gums.  Your caregiver will most likely recommend good oral hygiene at each visit.  For cavities, gingivitis, or other oral health disease, your caregiver will perform a procedure to treat the problem. This is typically done at a separate appointment. Sometimes your caregiver will refer you to another dental specialist for specific tooth problems or for surgery. SEEK IMMEDIATE DENTAL CARE IF:  You have pain, bleeding, or soreness in the gum, tooth, jaw, or mouth area.  A permanent tooth becomes loose or separated from the gum socket.  You experience a blow or injury to the mouth or jaw area. Document Released: 06/16/2011 Document Revised: 12/27/2011 Document Reviewed: 06/16/2011 [facility] Patient Information 2014 [place], [place].    Nausea and Vomiting Nausea is  a sick feeling that often comes before throwing up (vomiting). Vomiting is a reflex where stomach contents come out of your mouth. Vomiting can cause severe loss of body fluids (dehydration). Children and elderly adults can become dehydrated quickly, especially if they also have  diarrhea. Nausea and vomiting are symptoms of a condition or disease. It is important to find the cause of your symptoms. CAUSES   Direct irritation of the stomach lining. This irritation can result from increased acid production (gastroesophageal reflux disease), infection, food poisoning, taking certain medicines (such as nonsteroidal anti-inflammatory drugs), alcohol use, or tobacco use.  Signals from the brain.These signals could be caused by a headache, heat exposure, an inner ear disturbance, increased pressure in the brain from injury, infection, a tumor, or a concussion, pain, emotional stimulus, or metabolic problems.  An obstruction in the gastrointestinal tract (bowel obstruction).  Illnesses such as diabetes, hepatitis, gallbladder problems, appendicitis, kidney problems, cancer, sepsis, atypical symptoms of a heart attack, or eating disorders.  Medical treatments such as chemotherapy and radiation.  Receiving medicine that makes you sleep (general anesthetic) during surgery. DIAGNOSIS Your caregiver may ask for tests to be done if the problems do not improve after a few days. Tests may also be done if symptoms are severe or if the reason for the nausea and vomiting is not clear. Tests may include:  Urine tests.  Blood tests.  Stool tests.  Cultures (to look for evidence of infection).  X-rays or other imaging studies. Test results can help your caregiver make decisions about treatment or the need for additional tests. TREATMENT You need to stay well hydrated. Drink frequently but in small amounts.You may wish to drink water, sports drinks, clear broth, or eat frozen ice pops or gelatin dessert to help stay hydrated.When you eat, eating slowly may help prevent nausea.There are also some antinausea medicines that may help prevent nausea. HOME CARE INSTRUCTIONS   Take all medicine as directed by your caregiver.  If you do not have an appetite, do not force yourself to  eat. However, you must continue to drink fluids.  If you have an appetite, eat a normal diet unless your caregiver tells you differently.  Eat a variety of complex carbohydrates (rice, wheat, potatoes, bread), lean meats, yogurt, fruits, and vegetables.  Avoid high-fat foods because they are more difficult to digest.  Drink enough water and fluids to keep your urine clear or pale yellow.  If you are dehydrated, ask your caregiver for specific rehydration instructions. Signs of dehydration may include:  Severe thirst.  Dry lips and mouth.  Dizziness.  Dark urine.  Decreasing urine frequency and amount.  Confusion.  Rapid breathing or pulse. SEEK IMMEDIATE MEDICAL CARE IF:   You have blood or brown flecks (like coffee grounds) in your vomit.  You have black or bloody stools.  You have a severe headache or stiff neck.  You are confused.  You have severe abdominal pain.  You have chest pain or trouble breathing.  You do not urinate at least once every 8 hours.  You develop cold or clammy skin.  You continue to vomit for longer than 24 to 48 hours.  You have a fever. MAKE SURE YOU:   Understand these instructions.  Will watch your condition.  Will get help right away if you are not doing well or get worse. Document Released: 10/04/2005 Document Revised: 12/27/2011 Document Reviewed: 03/03/2011 [facility] Patient Information 2014 [place], [place].

## 2014-03-20 NOTE — ED Notes (Signed)
Lab called stating blood in lavender tube for CBC clotted; blood redrawn

## 2014-03-20 NOTE — ED Provider Notes (Signed)
Medical screening examination/treatment/procedure(s) were performed by non-physician practitioner and as supervising physician I was immediately available for consultation/collaboration.   EKG Interpretation   Date/Time:  Wednesday March 20 2014 02:24:19 EDT Ventricular Rate:  106 PR Interval:  210 QRS Duration: 77 QT Interval:  331 QTC Calculation: 439 R Axis:   65 Text Interpretation:  Sinus tachycardia Prolonged PR interval Borderline T  abnormalities, anterior leads When compared with ECG of 12/19/2011, No  significant change was found Confirmed by [facility]  MD, Christop Hippert ([id number]) on  03/20/2014 2:47:52 AM        Dione Booze, MD 03/20/14 [phone]

## 2014-03-20 NOTE — ED Provider Notes (Signed)
CSN: [id number]     Arrival date & time 03/19/14  2229 History   First MD Initiated Contact with Patient 03/20/14 [phone]     Chief Complaint  Patient presents with  . Dental Pain   HPI  History provided by the patient and significant other. Patient is a 23 year old female with limited English who presents with complaints of mouth and dental pain and discomfort. History is limited due to language barrier. Patient's husband does have proficient [place]. Patient speaks [place] and there is no [place] interpreter for this language to help translate. Patient was seen yesterday for complaints of a headache. Today she reports that she did not feel any better after receiving medications for her complaints yesterday. She reports feeling pain, pressure and throbbing in her mouth and tongue as well as all of her teeth. She also feels shaky and is having jitteriness to her body at times. Her symptoms are associated nausea and vomiting. She denies any diarrhea. Does report generalized weakness. She denies headache to me today. She does report some subjective fevers, hot flashes and chills. There have been no other aggravating or alleviating factors. No other associated symptoms. She reports last normal menstrual period 15 days ago and normal. Denies any other bleeding.    Past Medical History  Diagnosis Date  . Extended spectrum beta lactamase (ESBL) resistance   . [id number](707.8)    Past Surgical History  Procedure Laterality Date  . No past surgeries     Family History  Problem Relation Age of Onset  . Alcohol abuse Neg Hx   . Arthritis Neg Hx   . Asthma Neg Hx   . Birth defects Neg Hx   . Cancer Neg Hx   . COPD Neg Hx   . Depression Neg Hx   . Diabetes Neg Hx   . Drug abuse Neg Hx   . Early death Neg Hx   . Hearing loss Neg Hx   . Heart disease Neg Hx   . Hyperlipidemia Neg Hx   . Hypertension Neg Hx   . Kidney disease Neg Hx   . Learning disabilities Neg Hx   . Mental illness Neg Hx   .  Mental retardation Neg Hx   . Miscarriages / Stillbirths Neg Hx   . Stroke Neg Hx   . Vision loss Neg Hx    History  Substance Use Topics  . Smoking status: Never Smoker   . Smokeless tobacco: Never Used  . Alcohol Use: No   OB History   Grav Para Term Preterm Abortions TAB SAB Ect Mult Living   2 2 2  0 0 0 0 0 0 2     Review of Systems  Constitutional: Positive for fever.  Respiratory: Negative for cough and shortness of breath.   Gastrointestinal: Positive for nausea and vomiting.  Neurological: Positive for tremors, light-headedness and headaches.  All other systems reviewed and are negative.     Allergies  Review of patient's allergies indicates no known allergies.  Home Medications   Prior to Admission medications   Medication Sig Start Date End Date Taking? Authorizing Provider  cephALEXin (KEFLEX) 500 MG capsule Take 500 mg by mouth 2 (two) times daily. 7 day therapy course patient began on 03/18/14 03/18/14  Yes Jennifer L Piepenbrink, PA-C  Iron-FA-B Cmp-C-Biot-Probiotic (FUSION PLUS) CAPS Take 1 tablet by mouth daily.   Yes Historical Provider, MD   BP 135/72  Pulse 136  Temp(Src) 98.5 F (36.9 C) (Oral)  Resp 26  SpO2 100%  LMP 03/11/2014 Physical Exam  Nursing note and vitals reviewed. Constitutional: She is oriented to person, place, and time. She appears well-developed and well-nourished. No distress.  HENT:  Head: Normocephalic and atraumatic.  Mouth/Throat: Oropharynx is clear and moist.  There is some dental caries with some decay the teeth. Patient has not exhibited pain over the teeth or gums. There is no stiffness swelling. No swelling of the tongue. Tongue appears normal without tenderness or edema.  Eyes: Conjunctivae and EOM are normal. Pupils are equal, round, and reactive to light.  Neck: Normal range of motion. Neck supple.  No meningeal signs  Cardiovascular: Normal rate and regular rhythm.   No murmur heard. Pulmonary/Chest: Effort normal  and breath sounds normal. No respiratory distress. She has no wheezes. She has no rales.  Abdominal: Soft. She exhibits no distension. There is no tenderness. There is no rigidity, no rebound, no guarding, no CVA tenderness and no tenderness at McBurney's point.  Neurological: She is alert and oriented to person, place, and time. She has normal strength. No cranial nerve deficit or sensory deficit. Gait normal.  Skin: Skin is warm and dry. No rash noted.  Psychiatric: She has a normal mood and affect. Her behavior is normal.    ED Course  Procedures   COORDINATION OF CARE:  Nursing notes reviewed. Vital signs reviewed. Initial pt interview and examination performed.   Filed Vitals:   03/19/14 2317  BP: 135/72  Pulse: 136  Temp: 98.5 F (36.9 C)  TempSrc: Oral  Resp: 26  SpO2: 100%    12:20 AM patient seen and evaluated. Patient appears somewhat anxious. She is significantly tachycardic. Does not appear in severe pain or distress. History is limited due to language barrier. Patient is afebrile. She has complaints of pain around her tongue and mouth in general. There does not appear to be any specific tooth on exam that is causing pain. No signs of a dental abscess.  Basic laboratory testing ordered for further evaluation of her symptoms of weakness tremors and unusual mouth and face pain.  3:00 AM Labs show slight normocytic anemia. Patient does have slight hypokalemia. No other electrolyte abnormalities or concerns. Patient is feeling better after Zofran. Abdomen is nontender. Patient's heart rate has improved. Currently while at rest heart rate is in the 90s. At this time I do not suspect any concerning or emergent condition. I will provide the patient prescription for Zofran and recommend multivitamin. We'll also give dental referral and recommend PCP followup for her complaints. Patient and family agree with plan to return home at this time.   Treatment plan initiated: Medications   potassium chloride SA (K-DUR,KLOR-CON) CR tablet 40 mEq (not administered)  ondansetron (ZOFRAN-ODT) disintegrating tablet 4 mg (4 mg Oral Given 03/20/14 0054)     Results for orders placed during the hospital encounter of 03/19/14  COMPREHENSIVE METABOLIC PANEL      Result Value Ref Range   Sodium 137  137 - 147 mEq/L   Potassium 3.6 (*) 3.7 - 5.3 mEq/L   Chloride 101  96 - 112 mEq/L   CO2 20  19 - 32 mEq/L   Glucose, Bld 131 (*) 70 - 99 mg/dL   BUN 12  6 - 23 mg/dL   Creatinine, Ser 1.61  0.50 - 1.10 mg/dL   Calcium 9.5  8.4 - 09.6 mg/dL   Total Protein 8.8 (*) 6.0 - 8.3 g/dL   Albumin 4.7  3.5 - 5.2 g/dL  AST 17  0 - 37 U/L   ALT 16  0 - 35 U/L   Alkaline Phosphatase 94  39 - 117 U/L   Total Bilirubin <0.2 (*) 0.3 - 1.2 mg/dL   GFR calc non Af Amer >90  >90 mL/min   GFR calc Af Amer >90  >90 mL/min  URINALYSIS, ROUTINE W REFLEX MICROSCOPIC      Result Value Ref Range   Color, Urine YELLOW  YELLOW   APPearance CLEAR  CLEAR   Specific Gravity, Urine 1.014  1.005 - 1.030   pH 5.5  5.0 - 8.0   Glucose, UA NEGATIVE  NEGATIVE mg/dL   Hgb urine dipstick NEGATIVE  NEGATIVE   Bilirubin Urine NEGATIVE  NEGATIVE   Ketones, ur NEGATIVE  NEGATIVE mg/dL   Protein, ur NEGATIVE  NEGATIVE mg/dL   Urobilinogen, UA 0.2  0.0 - 1.0 mg/dL   Nitrite NEGATIVE  NEGATIVE   Leukocytes, UA SMALL (*) NEGATIVE  CBC WITH DIFFERENTIAL      Result Value Ref Range   WBC 10.6 (*) 4.0 - 10.5 K/uL   RBC 4.12  3.87 - 5.11 MIL/uL   Hemoglobin 10.6 (*) 12.0 - 15.0 g/dL   HCT 96.033.5 (*) 45.436.0 - 09.846.0 %   MCV 81.3  78.0 - 100.0 fL   MCH 25.7 (*) 26.0 - 34.0 pg   MCHC 31.6  30.0 - 36.0 g/dL   RDW 11.913.1  14.711.5 - 82.915.5 %   Platelets 185  150 - 400 K/uL   Neutrophils Relative % 84 (*) 43 - 77 %   Neutro Abs 8.9 (*) 1.7 - 7.7 K/uL   Lymphocytes Relative 12  12 - 46 %   Lymphs Abs 1.3  0.7 - 4.0 K/uL   Monocytes Relative 4  3 - 12 %   Monocytes Absolute 0.4  0.1 - 1.0 K/uL   Eosinophils Relative 0  0 - 5 %    Eosinophils Absolute 0.0  0.0 - 0.7 K/uL   Basophils Relative 0  0 - 1 %   Basophils Absolute 0.0  0.0 - 0.1 K/uL  URINE MICROSCOPIC-ADD ON      Result Value Ref Range   Squamous Epithelial / LPF FEW (*) RARE   WBC, UA 3-6  <3 WBC/hpf   RBC / HPF 0-2  <3 RBC/hpf   Bacteria, UA RARE  RARE   Urine-Other RARE YEAST    CBG MONITORING, ED      Result Value Ref Range   Glucose-Capillary 130 (*) 70 - 99 mg/dL      EKG Interpretation   Date/Time:  Wednesday March 20 2014 02:24:19 EDT Ventricular Rate:  106 PR Interval:  210 QRS Duration: 77 QT Interval:  331 QTC Calculation: 439 R Axis:   65 Text Interpretation:  Sinus tachycardia Prolonged PR interval Borderline T  abnormalities, anterior leads When compared with ECG of 12/19/2011, No  significant change was found Confirmed by [facility]  MD, DAVID ([id number]) on  03/20/2014 2:47:52 AM      MDM   Final diagnoses:  Oral pain  Nausea & vomiting       Angus Sellereter S Hurschel Paynter, PA-C 03/20/14 [id number]

## 2014-03-21 NOTE — ED Provider Notes (Signed)
Medical screening examination/treatment/procedure(s) were performed by non-physician practitioner and as supervising physician I was immediately available for consultation/collaboration.   EKG Interpretation None       Jemeka Wagler, MD 03/21/14 0304 

## 2014-04-07 ENCOUNTER — Encounter (HOSPITAL_COMMUNITY): Payer: Self-pay | Admitting: Emergency Medicine

## 2014-04-07 ENCOUNTER — Emergency Department (HOSPITAL_COMMUNITY)
Admission: EM | Admit: 2014-04-07 | Discharge: 2014-04-10 | Disposition: A | Discharge status: Other Acute Inpatient Hospital | Payer: Medicaid Other | Attending: Emergency Medicine | Admitting: Emergency Medicine

## 2014-04-07 DIAGNOSIS — O9989 Other specified diseases and conditions complicating pregnancy, childbirth and the puerperium: Secondary | ICD-10-CM | POA: Insufficient documentation

## 2014-04-07 DIAGNOSIS — F329 Major depressive disorder, single episode, unspecified: Secondary | ICD-10-CM

## 2014-04-07 DIAGNOSIS — Z79899 Other long term (current) drug therapy: Secondary | ICD-10-CM | POA: Insufficient documentation

## 2014-04-07 DIAGNOSIS — F411 Generalized anxiety disorder: Secondary | ICD-10-CM | POA: Insufficient documentation

## 2014-04-07 DIAGNOSIS — G478 Other sleep disorders: Secondary | ICD-10-CM | POA: Insufficient documentation

## 2014-04-07 DIAGNOSIS — O99345 Other mental disorders complicating the puerperium: Secondary | ICD-10-CM | POA: Insufficient documentation

## 2014-04-07 DIAGNOSIS — F3289 Other specified depressive episodes: Secondary | ICD-10-CM | POA: Insufficient documentation

## 2014-04-07 DIAGNOSIS — Z3202 Encounter for pregnancy test, result negative: Secondary | ICD-10-CM | POA: Insufficient documentation

## 2014-04-07 DIAGNOSIS — O2390 Unspecified genitourinary tract infection in pregnancy, unspecified trimester: Secondary | ICD-10-CM | POA: Insufficient documentation

## 2014-04-07 DIAGNOSIS — R112 Nausea with vomiting, unspecified: Secondary | ICD-10-CM | POA: Insufficient documentation

## 2014-04-07 DIAGNOSIS — F53 Postpartum depression: Secondary | ICD-10-CM | POA: Diagnosis present

## 2014-04-07 DIAGNOSIS — F39 Unspecified mood [affective] disorder: Secondary | ICD-10-CM | POA: Insufficient documentation

## 2014-04-07 DIAGNOSIS — F29 Unspecified psychosis not due to a substance or known physiological condition: Secondary | ICD-10-CM | POA: Diagnosis present

## 2014-04-07 DIAGNOSIS — N390 Urinary tract infection, site not specified: Secondary | ICD-10-CM | POA: Insufficient documentation

## 2014-04-07 LAB — I-STAT CHEM 8, ED
BUN: 16 mg/dL (ref 6–23)
Calcium, Ion: 1.16 mmol/L (ref 1.12–1.23)
Chloride: 105 mEq/L (ref 96–112)
Creatinine, Ser: 0.8 mg/dL (ref 0.50–1.10)
GLUCOSE: 110 mg/dL — AB (ref 70–99)
HCT: 36 % (ref 36.0–46.0)
HEMOGLOBIN: 12.2 g/dL (ref 12.0–15.0)
Potassium: 3.2 mEq/L — ABNORMAL LOW (ref 3.7–5.3)
Sodium: 144 mEq/L (ref 137–147)
TCO2: 21 mmol/L (ref 0–100)

## 2014-04-07 LAB — COMPREHENSIVE METABOLIC PANEL
ALT: 23 U/L (ref 0–35)
AST: 32 U/L (ref 0–37)
Albumin: 5.1 g/dL (ref 3.5–5.2)
Alkaline Phosphatase: 87 U/L (ref 39–117)
BUN: 18 mg/dL (ref 6–23)
CO2: 23 mEq/L (ref 19–32)
CREATININE: 0.69 mg/dL (ref 0.50–1.10)
Calcium: 9.9 mg/dL (ref 8.4–10.5)
Chloride: 102 mEq/L (ref 96–112)
GLUCOSE: 95 mg/dL (ref 70–99)
Potassium: 3.7 mEq/L (ref 3.7–5.3)
Sodium: 144 mEq/L (ref 137–147)
Total Bilirubin: 0.4 mg/dL (ref 0.3–1.2)
Total Protein: 9.1 g/dL — ABNORMAL HIGH (ref 6.0–8.3)

## 2014-04-07 LAB — URINE MICROSCOPIC-ADD ON

## 2014-04-07 LAB — CBC WITH DIFFERENTIAL/PLATELET
BASOS PCT: 0 % (ref 0–1)
Basophils Absolute: 0 10*3/uL (ref 0.0–0.1)
Eosinophils Absolute: 0 10*3/uL (ref 0.0–0.7)
Eosinophils Relative: 0 % (ref 0–5)
HEMATOCRIT: 34.6 % — AB (ref 36.0–46.0)
HEMOGLOBIN: 10.8 g/dL — AB (ref 12.0–15.0)
LYMPHS ABS: 2.8 10*3/uL (ref 0.7–4.0)
Lymphocytes Relative: 24 % (ref 12–46)
MCH: 25.5 pg — ABNORMAL LOW (ref 26.0–34.0)
MCHC: 31.2 g/dL (ref 30.0–36.0)
MCV: 81.6 fL (ref 78.0–100.0)
MONO ABS: 0.7 10*3/uL (ref 0.1–1.0)
MONOS PCT: 6 % (ref 3–12)
NEUTROS ABS: 8 10*3/uL — AB (ref 1.7–7.7)
Neutrophils Relative %: 70 % (ref 43–77)
Platelets: 283 10*3/uL (ref 150–400)
RBC: 4.24 MIL/uL (ref 3.87–5.11)
RDW: 13.4 % (ref 11.5–15.5)
WBC: 11.5 10*3/uL — ABNORMAL HIGH (ref 4.0–10.5)

## 2014-04-07 LAB — URINALYSIS, ROUTINE W REFLEX MICROSCOPIC
Glucose, UA: NEGATIVE mg/dL
Ketones, ur: 40 mg/dL — AB
Nitrite: NEGATIVE
PROTEIN: 100 mg/dL — AB
Specific Gravity, Urine: 1.041 — ABNORMAL HIGH (ref 1.005–1.030)
Urobilinogen, UA: 0.2 mg/dL (ref 0.0–1.0)
pH: 5.5 (ref 5.0–8.0)

## 2014-04-07 LAB — TSH: TSH: 2.08 u[IU]/mL (ref 0.350–4.500)

## 2014-04-07 LAB — PREGNANCY, URINE: PREG TEST UR: NEGATIVE

## 2014-04-07 MED ORDER — NITROFURANTOIN MONOHYD MACRO 100 MG PO CAPS: 100.0000 mg | ORAL_CAPSULE | Freq: Two times a day (BID) | ORAL | Status: DC

## 2014-04-07 MED ORDER — DEXTROSE 5 % IV SOLN
1.0000 g | Freq: Once | INTRAVENOUS | Status: AC
  Administered 2014-04-07: 1 g via INTRAVENOUS
  Filled 2014-04-07: qty 10

## 2014-04-07 MED ORDER — SODIUM CHLORIDE 0.9 % IV BOLUS (SEPSIS)
1000.0000 mL | Freq: Once | INTRAVENOUS | Status: AC
  Administered 2014-04-07: 1000 mL via INTRAVENOUS

## 2014-04-07 MED ORDER — OLANZAPINE 5 MG PO TBDP
5.0000 mg | ORAL_TABLET | Freq: Every day | ORAL | Status: DC
  Administered 2014-04-07 – 2014-04-09 (×3): 5 mg via ORAL
  Filled 2014-04-07 (×3): qty 1

## 2014-04-07 MED ORDER — SODIUM CHLORIDE 0.9 % IV SOLN: Freq: Once | INTRAVENOUS | Status: DC

## 2014-04-07 NOTE — ED Notes (Signed)
Pt praying at this time. Husband will come out and get nurse when pt is able to take medication.

## 2014-04-07 NOTE — Consult Note (Signed)
[facility] Face-to-Face Psychiatry Consult   Reason for Consult:  Postpartum depression with psychosis  Referring Physician:  EDP  Catherine Gomez is an 23 y.o. female. Total Time spent with patient: 45 minutes  Assessment: AXIS I:  Psychotic Disorder NOS and Postpartum Depression AXIS II:  Deferred AXIS III:   Past Medical History  Diagnosis Date  . Extended spectrum beta lactamase (ESBL) resistance   . Headache(784.0)    AXIS IV:  other psychosocial or environmental problems and problems with access to health care services AXIS V:  21-30 behavior considerably influenced by delusions or hallucinations OR serious impairment in judgment, communication OR inability to function in almost all areas  Plan:  Recommend psychiatric Inpatient admission when medically cleared.  Subjective:   Catherine Gomez is a 23 y.o. female patient admitted with Psychotic Disorder NOS and Postpartum Depression .  HPI:  Patient sitting up in bed staring straight ahead singing a song in her native language and rocking back and forth.  Patient has emesis bag that she is using to spit in and patient would also blow air out through nose.  Patient would not answer any questions.  Patient husband at bedside states "She has been here for 3 years.  When she first came here she was fine.  When she had the baby September 2015 everything changed.  She has not been the same since she had the baby.  I have taken her to Dr. Ruthann Cancer and he said that he can't do anything until approved by Medicaid and tell me to take her to doctor.  I take her to doctor and tell me to take her to another doctor; I don't understand; I been trying to get her help and nobody will help.  After the baby was born she started to get depressed.  The depression started getting worse for the last 3 months she won't eat nothing but drink water, milk and sometime yogurt.  She say that our God is telling her to throw away her cloths and do not touch our  children.  She say that someone is trying to poison her; Sometimes I think she means me; but she didn't say me.  She will wake up crying.  Now she has really gotten worse and all I want if for someone to help [place]."  Patient states that their religion is Muslim and native language is [place]. HPI Elements:   Location:  Postpartum Depression and Psychosis. Quality:  Withdrawn and hallucination. Severity:  Not eating or caring for self or children. Duration:  Started with depression 6 months ago and progressed to not eating or caring for self or children 3 months ago. Context:  3 months of not eating, not caring for self or children, auditory and visual hallucinations. Family History  Problem Relation Age of Onset  . Alcohol abuse Neg Hx   . Arthritis Neg Hx   . Asthma Neg Hx   . Birth defects Neg Hx   . Cancer Neg Hx   . COPD Neg Hx   . Depression Neg Hx   . Diabetes Neg Hx   . Drug abuse Neg Hx   . Early death Neg Hx   . Hearing loss Neg Hx   . Heart disease Neg Hx   . Hyperlipidemia Neg Hx   . Hypertension Neg Hx   . Kidney disease Neg Hx   . Learning disabilities Neg Hx   . Mental illness Neg Hx   . Mental retardation Neg Hx   .  Miscarriages / Stillbirths Neg Hx   . Stroke Neg Hx   . Vision loss Neg Hx   Review of Systems  Unable to perform ROS: acuity of condition  Psychiatric/Behavioral: Positive for depression and hallucinations. Negative for substance abuse. Suicidal ideas: Patient unable to answer question.   Family History  Problem Relation Age of Onset  . Alcohol abuse Neg Hx   . Arthritis Neg Hx   . Asthma Neg Hx   . Birth defects Neg Hx   . Cancer Neg Hx   . COPD Neg Hx   . Depression Neg Hx   . Diabetes Neg Hx   . Drug abuse Neg Hx   . Early death Neg Hx   . Hearing loss Neg Hx   . Heart disease Neg Hx   . Hyperlipidemia Neg Hx   . Hypertension Neg Hx   . Kidney disease Neg Hx   . Learning disabilities Neg Hx   . Mental illness Neg Hx   . Mental  retardation Neg Hx   . Miscarriages / Stillbirths Neg Hx   . Stroke Neg Hx   . Vision loss Neg Hx     Past Psychiatric History: Past Medical History  Diagnosis Date  . Extended spectrum beta lactamase (ESBL) resistance   . Headache(784.0)     reports that she has never smoked. She has never used smokeless tobacco. She reports that she does not drink alcohol or use illicit drugs. Family History  Problem Relation Age of Onset  . Alcohol abuse Neg Hx   . Arthritis Neg Hx   . Asthma Neg Hx   . Birth defects Neg Hx   . Cancer Neg Hx   . COPD Neg Hx   . Depression Neg Hx   . Diabetes Neg Hx   . Drug abuse Neg Hx   . Early death Neg Hx   . Hearing loss Neg Hx   . Heart disease Neg Hx   . Hyperlipidemia Neg Hx   . Hypertension Neg Hx   . Kidney disease Neg Hx   . Learning disabilities Neg Hx   . Mental illness Neg Hx   . Mental retardation Neg Hx   . Miscarriages / Stillbirths Neg Hx   . Stroke Neg Hx   . Vision loss Neg Hx      Living Arrangements: Spouse/significant other;Children Can pt return to current living arrangement?: Yes   Allergies:  No Known Allergies  ACT Assessment Complete:  Yes:    Educational Status    Risk to Self: Risk to self Is patient at risk for suicide?: No Substance abuse history and/or treatment for substance abuse?: No  Risk to Others:    Abuse:    Prior Inpatient Therapy:    Prior Outpatient Therapy:    Additional Information:       Objective: Blood pressure 127/74, pulse 108, resp. rate 16, last menstrual period 03/31/2014, SpO2 100.00%, not currently breastfeeding.There is no weight on file to calculate BMI. Results for orders placed during the hospital encounter of 04/07/14 (from the past 72 hour(s))  URINALYSIS, ROUTINE W REFLEX MICROSCOPIC     Status: Abnormal   Collection Time    04/07/14 12:48 PM      Result Value Ref Range   Color, Urine AMBER (*) YELLOW   Comment: BIOCHEMICALS MAY BE AFFECTED BY COLOR   APPearance CLOUDY  (*) CLEAR   Specific Gravity, Urine 1.041 (*) 1.005 - 1.030   pH 5.5  5.0 -  8.0   Glucose, UA NEGATIVE  NEGATIVE mg/dL   Hgb urine dipstick TRACE (*) NEGATIVE   Bilirubin Urine SMALL (*) NEGATIVE   Ketones, ur 40 (*) NEGATIVE mg/dL   Protein, ur 100 (*) NEGATIVE mg/dL   Urobilinogen, UA 0.2  0.0 - 1.0 mg/dL   Nitrite NEGATIVE  NEGATIVE   Leukocytes, UA MODERATE (*) NEGATIVE  PREGNANCY, URINE     Status: None   Collection Time    04/07/14 12:48 PM      Result Value Ref Range   Preg Test, Ur NEGATIVE  NEGATIVE   Comment:            THE SENSITIVITY OF THIS     METHODOLOGY IS >20 mIU/mL.  URINE MICROSCOPIC-ADD ON     Status: Abnormal   Collection Time    04/07/14 12:48 PM      Result Value Ref Range   Squamous Epithelial / LPF MANY (*) RARE   WBC, UA 21-50  <3 WBC/hpf   RBC / HPF 0-2  <3 RBC/hpf   Bacteria, UA FEW (*) RARE   Urine-Other MUCOUS PRESENT    CBC WITH DIFFERENTIAL     Status: Abnormal   Collection Time    04/07/14 12:56 PM      Result Value Ref Range   WBC 11.5 (*) 4.0 - 10.5 K/uL   RBC 4.24  3.87 - 5.11 MIL/uL   Hemoglobin 10.8 (*) 12.0 - 15.0 g/dL   HCT 34.6 (*) 36.0 - 46.0 %   MCV 81.6  78.0 - 100.0 fL   MCH 25.5 (*) 26.0 - 34.0 pg   MCHC 31.2  30.0 - 36.0 g/dL   RDW 13.4  11.5 - 15.5 %   Platelets 283  150 - 400 K/uL   Neutrophils Relative % 70  43 - 77 %   Neutro Abs 8.0 (*) 1.7 - 7.7 K/uL   Lymphocytes Relative 24  12 - 46 %   Lymphs Abs 2.8  0.7 - 4.0 K/uL   Monocytes Relative 6  3 - 12 %   Monocytes Absolute 0.7  0.1 - 1.0 K/uL   Eosinophils Relative 0  0 - 5 %   Eosinophils Absolute 0.0  0.0 - 0.7 K/uL   Basophils Relative 0  0 - 1 %   Basophils Absolute 0.0  0.0 - 0.1 K/uL  COMPREHENSIVE METABOLIC PANEL     Status: Abnormal   Collection Time    04/07/14 12:56 PM      Result Value Ref Range   Sodium 144  137 - 147 mEq/L   Potassium 3.7  3.7 - 5.3 mEq/L   Chloride 102  96 - 112 mEq/L   CO2 23  19 - 32 mEq/L   Glucose, Bld 95  70 - 99 mg/dL    BUN 18  6 - 23 mg/dL   Creatinine, Ser 0.69  0.50 - 1.10 mg/dL   Calcium 9.9  8.4 - 10.5 mg/dL   Total Protein 9.1 (*) 6.0 - 8.3 g/dL   Albumin 5.1  3.5 - 5.2 g/dL   AST 32  0 - 37 U/L   Comment: HEMOLYSIS AT THIS LEVEL MAY AFFECT RESULT   ALT 23  0 - 35 U/L   Alkaline Phosphatase 87  39 - 117 U/L   Total Bilirubin 0.4  0.3 - 1.2 mg/dL   GFR calc non Af Amer >90  >90 mL/min   GFR calc Af Amer >90  >90 mL/min   Comment: (  NOTE)     The eGFR has been calculated using the CKD EPI equation.     This calculation has not been validated in all clinical situations.     eGFR's persistently <90 mL/min signify possible Chronic Kidney     Disease.  TSH     Status: None   Collection Time    04/07/14 12:56 PM      Result Value Ref Range   TSH 2.080  0.350 - 4.500 uIU/mL   Comment: Performed at [place] [facility], ED     Status: Abnormal   Collection Time    04/07/14  2:38 PM      Result Value Ref Range   Sodium 144  137 - 147 mEq/L   Potassium 3.2 (*) 3.7 - 5.3 mEq/L   Chloride 105  96 - 112 mEq/L   BUN 16  6 - 23 mg/dL   Creatinine, Ser 0.80  0.50 - 1.10 mg/dL   Glucose, Bld 110 (*) 70 - 99 mg/dL   Calcium, Ion 1.16  1.12 - 1.23 mmol/L   TCO2 21  0 - 100 mmol/L   Hemoglobin 12.2  12.0 - 15.0 g/dL   HCT 36.0  36.0 - 46.0 %   Labs are reviewed see abnormal values above.  Medications reviwed.  Will start Zyprexia zydis 5 mg now and then 5 mg Q hs   Current Facility-Administered Medications  Medication Dose Route Frequency Shaleigh Laubscher Last Rate Last Dose  . 0.9 %  sodium chloride infusion   Intravenous Once Tanna Furry, MD       Current Outpatient Prescriptions  Medication Sig Dispense Refill  . Iron-FA-B Cmp-C-Biot-Probiotic (FUSION PLUS) CAPS Take 1 tablet by mouth daily.      Marland Kitchen levonorgestrel-ethinyl estradiol (JOLESSA) 0.15-0.03 MG tablet Take 1 tablet by mouth daily.      . nitrofurantoin, macrocrystal-monohydrate, (MACROBID) 100 MG capsule Take 1 capsule (100 mg total)  by mouth 2 (two) times daily.  20 capsule  0    Psychiatric Specialty Exam:     Blood pressure 127/74, pulse 108, resp. rate 16, last menstrual period 03/31/2014, SpO2 100.00%, not currently breastfeeding.There is no weight on file to calculate BMI.  General Appearance: Casual  Eye Contact::  None  Speech:  Patient only sing.  Speach is clear, normal rate  Volume:  Normal  Mood:  Unable to determine mood of patient at this time.  Patient is not talking to anyone  Affect:  Depressed and Flat  Thought Process:  Unable to determine mood of patient at this time.  Patient is not talking to anyone  Orientation:  Other:  Unable to determine mood of patient at this time.  Patient is not talking to anyone  Thought Content:  Hallucinations: Auditory Command:   Tell her not to touch her children, someone is trying to poison her,  and to throw away cloths,      Huband thinks that patient may also be having visual hallucinations but not sure  Suicidal Thoughts:  No  Patient husband states that patient has not said anything about want to hurt or kill herself  Homicidal Thoughts:  No  Patient husband states that patient has not said anything about wanting to hurt or kill any one and has not said anything about wanting to hurt or kill children  Memory:  Unable to determine at this time.  Patient is not talking to anyone  Judgement:  Impaired  Insight:  Unable to determine  at this time.  Patient is not talking to anyone  Psychomotor Activity:  Restlessness  Concentration:  Unable to determine at this time.  Patient is not talking to anyone  Recall:  Unable to determine at this time.  Patient is not talking to anyone  [place] to determine at this time.  Patient is not talking to anyone  Language: Patient does not speak English only [place]  Akathisia:  Negative  Handed:  Right  AIMS (if indicated):     Assets:  Housing Social Support  Sleep:      Musculoskeletal: Strength & Muscle  Tone: within normal limits Gait & Station: Did not see patient ambulate.  Patient is able to move all extrimities without difficulty while sitting up in bed Patient leans: N/A  Treatment Plan Summary: Daily contact with patient to assess and evaluate symptoms and progress in treatment Medication management Inpatient treatment recommended.  Will monitor for safety and stabilization until inpatient hospital bed is found. Zyprexa Zydis started.  Patient will also need an interpreter for programing and meeting with psychiatrist.   Earleen Newport, FNP-BC 04/07/2014 5:03 PM  I have personally seen the patient and agreed with the findings and involved in the treatment plan. Berniece Andreas, MD

## 2014-04-07 NOTE — ED Notes (Signed)
TTS at bedside. 

## 2014-04-07 NOTE — ED Notes (Signed)
MD at bedside. 

## 2014-04-07 NOTE — ED Notes (Signed)
Pt presents to ed with multiple complaints. Pt does not speak english, husband is translating for her. Per husband pt has been "sick for 6 months, ever since she had a baby". Husband reports pt has not been eating for days, only drinking water. Husband sts pt is so weak that she can hardly walk. Husband also sts pt spends a lot of time crying. Sts they were unable to follow up with PCP due to medicaid issues.

## 2014-04-07 NOTE — ED Provider Notes (Addendum)
CSN: [id number]     Arrival date & time 04/07/14  1147 History   First MD Initiated Contact with Patient 04/07/14 1208     Chief Complaint  Patient presents with  . multiple complaints       HPI  Patient presents with her family. Husband, and 2 kids accompany her. She is 9 months postpartum. She speaks limited [place]. She does speak [place]. Her husband is fluent in [place] and able to interpret. Her last visit we had attempted to obtain an interpreter.  However,  [employer] does not provide for [employer] interpretation. I discussed this with the husband he states they are comfortable with his interpretation.  Patient is 9 months post partum. She has  been struggling since delivery. She has had frequent headaches. She's had one previous UTI, diagnosed in the emergency room that was treated. Husband states that she is weak. She is able to drink but eats minimally. Food "makes her uncomfortable". He states that she cries often. She concurs.  She is not suicidal or threatening harm to herself or anyone else or 2 children and is caring for her children. Husband is concerned because she has ongoing weakness. He states he sleeps minimally and is up a lot at night.  Past Medical History  Diagnosis Date  . Extended spectrum beta lactamase (ESBL) resistance   . MVHQIONG(295.2)    Past Surgical History  Procedure Laterality Date  . No past surgeries     Family History  Problem Relation Age of Onset  . Alcohol abuse Neg Hx   . Arthritis Neg Hx   . Asthma Neg Hx   . Birth defects Neg Hx   . Cancer Neg Hx   . COPD Neg Hx   . Depression Neg Hx   . Diabetes Neg Hx   . Drug abuse Neg Hx   . Early death Neg Hx   . Hearing loss Neg Hx   . Heart disease Neg Hx   . Hyperlipidemia Neg Hx   . Hypertension Neg Hx   . Kidney disease Neg Hx   . Learning disabilities Neg Hx   . Mental illness Neg Hx   . Mental retardation Neg Hx   . Miscarriages / Stillbirths Neg Hx   . Stroke Neg Hx   .  Vision loss Neg Hx    History  Substance Use Topics  . Smoking status: Never Smoker   . Smokeless tobacco: Never Used  . Alcohol Use: No   OB History   Grav Para Term Preterm Abortions TAB SAB Ect Mult Living   2 2 2  0 0 0 0 0 0 2     Review of Systems  Constitutional: Positive for fatigue. Negative for fever, chills, diaphoresis and appetite change.  HENT: Negative for mouth sores, sore throat and trouble swallowing.   Eyes: Negative for visual disturbance.  Respiratory: Negative for cough, chest tightness, shortness of breath and wheezing.   Cardiovascular: Negative for chest pain.  Gastrointestinal: Positive for nausea, vomiting and abdominal pain. Negative for diarrhea and abdominal distention.  Endocrine: Negative for polydipsia, polyphagia and polyuria.  Genitourinary: Positive for dysuria and frequency. Negative for hematuria.  Musculoskeletal: Negative for gait problem.  Skin: Negative for color change, pallor and rash.  Neurological: Positive for weakness. Negative for dizziness, syncope, light-headedness and headaches.  Hematological: Does not bruise/bleed easily.  Psychiatric/Behavioral: Positive for sleep disturbance and dysphoric mood. Negative for suicidal ideas, behavioral problems, confusion, self-injury and agitation. The patient is not  nervous/anxious.       Allergies  Review of patient's allergies indicates no known allergies.  Home Medications   Prior to Admission medications   Medication Sig Start Date End Date Taking? Authorizing Provider  Iron-FA-B Cmp-C-Biot-Probiotic (FUSION PLUS) CAPS Take 1 tablet by mouth daily.   Yes Historical Provider, MD  levonorgestrel-ethinyl estradiol (JOLESSA) 0.15-0.03 MG tablet Take 1 tablet by mouth daily.   Yes Historical Provider, MD   BP 127/74  Pulse 108  Resp 16  SpO2 100%  LMP 03/31/2014  Breastfeeding? No Physical Exam  Constitutional: She is oriented to person, place, and time. She appears well-developed  and well-nourished. No distress.  Black female awake. Somewhat pressured speech.  HENT:  Head: Normocephalic.  Conjunctiva are not pale. No scleral icterus. Mucous membranes are not dried.  Eyes: Conjunctivae are normal. Pupils are equal, round, and reactive to light. No scleral icterus.  Neck: Normal range of motion. Neck supple. No thyromegaly present.  Neck is supple. No adenopathy. No Thyromegaly  Cardiovascular: Normal rate, regular rhythm and normal heart sounds.  Exam reveals no gallop and no friction rub.   No murmur heard. She is not tachycardic at rest  Pulmonary/Chest: Effort normal and breath sounds normal. No respiratory distress. She has no wheezes. She has no rales.  Clear lungs  Abdominal: Soft. Bowel sounds are normal. She exhibits no distension. There is no tenderness. There is no rebound.  Abdomen soft. Nondistended. No complaint of tenderness. No masses. Normal active bowel sounds.  Musculoskeletal: Normal range of motion.  Neurological: She is alert and oriented to person, place, and time.  Skin: Skin is warm and dry. No rash noted.  Psychiatric: Her behavior is normal. Her mood appears anxious. Her speech is rapid and/or pressured.    ED Course  Procedures (including critical care time) Labs Review Labs Reviewed  CBC WITH DIFFERENTIAL - Abnormal; Notable for the following:    WBC 11.5 (*)    Hemoglobin 10.8 (*)    HCT 34.6 (*)    MCH 25.5 (*)    Neutro Abs 8.0 (*)    All other components within normal limits  URINALYSIS, ROUTINE W REFLEX MICROSCOPIC - Abnormal; Notable for the following:    Color, Urine AMBER (*)    APPearance CLOUDY (*)    Specific Gravity, Urine 1.041 (*)    Hgb urine dipstick TRACE (*)    Bilirubin Urine SMALL (*)    Ketones, ur 40 (*)    Protein, ur 100 (*)    Leukocytes, UA MODERATE (*)    All other components within normal limits  URINE MICROSCOPIC-ADD ON - Abnormal; Notable for the following:    Squamous Epithelial / LPF MANY  (*)    Bacteria, UA FEW (*)    All other components within normal limits  I-STAT CHEM 8, ED - Abnormal; Notable for the following:    Potassium 3.2 (*)    Glucose, Bld 110 (*)    All other components within normal limits  URINE CULTURE  PREGNANCY, URINE  COMPREHENSIVE METABOLIC PANEL  TSH    Imaging Review No results found.   EKG Interpretation None      MDM   Final diagnoses:  Post partum depression  UTI (lower urinary tract infection)    Symptoms are most consistent with postpartum depression. Not suicidal. Not psychotic. Meter basic needs. Although, not eating well. Plan will be laboratory evaluation for any metabolic derangements or signs of dehydration. Thyroid evaluation. Patency test urinalysis. Hydration. Evaluation.  TTS consult regarding her depression.  UA shows many WBCs.  Many Squams also.  CX requested.  May be contminant, but will treat empirically.  CMP pending due to Lab equipment down, ED POC Chem 8 Ordered, normal renal function.  Pt medically cleared.  D/W TTS, they will see and evaluate. I appreciate their input.   If no indication for admission,plan will be outpatient treatment for UTI and depression.   Rolland PorterMark Andilynn Delavega, MD 04/07/14 1440  Rolland PorterMark Shebra Muldrow, MD 04/07/14 [phone]

## 2014-04-07 NOTE — Discharge Instructions (Signed)
Depression, Adult Depression refers to feeling sad, low, down in the dumps, blue, gloomy, or empty. In general, there are two kinds of depression: 1. Depression that we all experience from time to time because of upsetting life experiences, including the loss of a job or the ending of a relationship (normal sadness or normal grief). This kind of depression is considered normal, is short lived, and resolves within a few days to 2 weeks. (Depression experienced after the loss of a loved one is called bereavement. Bereavement often lasts longer than 2 weeks but normally gets better with time.) 2. Clinical depression, which lasts longer than normal sadness or normal grief or interferes with your ability to function at home, at work, and in school. It also interferes with your personal relationships. It affects almost every aspect of your life. Clinical depression is an illness. Symptoms of depression also can be caused by conditions other than normal sadness and grief or clinical depression. Examples of these conditions are listed as follows:  Physical illness--Some physical illnesses, including underactive thyroid gland (hypothyroidism), severe anemia, specific types of cancer, diabetes, uncontrolled seizures, heart and lung problems, strokes, and chronic pain are commonly associated with symptoms of depression.  Side effects of some prescription medicine--In some people, certain types of prescription medicine can cause symptoms of depression.  Substance abuse--Abuse of alcohol and illicit drugs can cause symptoms of depression. SYMPTOMS Symptoms of normal sadness and normal grief include the following:  Feeling sad or crying for short periods of time.  Not caring about anything (apathy).  Difficulty sleeping or sleeping too much.  No longer able to enjoy the things you used to enjoy.  Desire to be by oneself all the time (social isolation).  Lack of energy or motivation.  Difficulty  concentrating or remembering.  Change in appetite or weight.  Restlessness or agitation. Symptoms of clinical depression include the same symptoms of normal sadness or normal grief and also the following symptoms:  Feeling sad or crying all the time.  Feelings of guilt or worthlessness.  Feelings of hopelessness or helplessness.  Thoughts of suicide or the desire to harm yourself (suicidal ideation).  Loss of touch with reality (psychotic symptoms). Seeing or hearing things that are not real (hallucinations) or having false beliefs about your life or the people around you (delusions and paranoia). DIAGNOSIS  The diagnosis of clinical depression usually is based on the severity and duration of the symptoms. Your caregiver also will ask you questions about your medical history and substance use to find out if physical illness, use of prescription medicine, or substance abuse is causing your depression. Your caregiver also may order blood tests. TREATMENT  Typically, normal sadness and normal grief do not require treatment. However, sometimes antidepressant medicine is prescribed for bereavement to ease the depressive symptoms until they resolve. The treatment for clinical depression depends on the severity of your symptoms but typically includes antidepressant medicine, counseling with a mental health professional, or a combination of both. Your caregiver will help to determine what treatment is best for you. Depression caused by physical illness usually goes away with appropriate medical treatment of the illness. If prescription medicine is causing depression, talk with your caregiver about stopping the medicine, decreasing the dose, or substituting another medicine. Depression caused by abuse of alcohol or illicit drugs abuse goes away with abstinence from these substances. Some adults need professional help in order to stop drinking or using drugs. SEEK IMMEDIATE CARE IF:  You have thoughts  about  hurting yourself or others.  You lose touch with reality (have psychotic symptoms).  You are taking medicine for depression and have a serious side effect. FOR MORE INFORMATION National Alliance on Mental Illness: www.nami.[employer] National Institute of Mental Health: http://www.maynard.net/www.nimh.nih.gov Document Released: 10/01/2000 Document Revised: 04/04/2012 Document Reviewed: 01/03/2012 [facility] Patient Information 2015 [place], [place]. This information is not intended to replace advice given to you by your health care provider. Make sure you discuss any questions you have with your health care provider.  Urinary Tract Infection Urinary tract infections (UTIs) can develop anywhere along your urinary tract. Your urinary tract is your body's drainage system for removing wastes and extra water. Your urinary tract includes two kidneys, two ureters, a bladder, and a urethra. Your kidneys are a pair of bean-shaped organs. Each kidney is about the size of your fist. They are located below your ribs, one on each side of your spine. CAUSES Infections are caused by microbes, which are microscopic organisms, including fungi, viruses, and bacteria. These organisms are so small that they can only be seen through a microscope. Bacteria are the microbes that most commonly cause UTIs. SYMPTOMS  Symptoms of UTIs may vary by age and gender of the patient and by the location of the infection. Symptoms in young women typically include a frequent and intense urge to urinate and a painful, burning feeling in the bladder or urethra during urination. Older women and men are more likely to be tired, shaky, and weak and have muscle aches and abdominal pain. A fever may mean the infection is in your kidneys. Other symptoms of a kidney infection include pain in your back or sides below the ribs, nausea, and vomiting. DIAGNOSIS To diagnose a UTI, your caregiver will ask you about your symptoms. Your caregiver also will ask to provide a  urine sample. The urine sample will be tested for bacteria and white blood cells. White blood cells are made by your body to help fight infection. TREATMENT  Typically, UTIs can be treated with medication. Because most UTIs are caused by a bacterial infection, they usually can be treated with the use of antibiotics. The choice of antibiotic and length of treatment depend on your symptoms and the type of bacteria causing your infection. HOME CARE INSTRUCTIONS  If you were prescribed antibiotics, take them exactly as your caregiver instructs you. Finish the medication even if you feel better after you have only taken some of the medication.  Drink enough water and fluids to keep your urine clear or pale yellow.  Avoid caffeine, tea, and carbonated beverages. They tend to irritate your bladder.  Empty your bladder often. Avoid holding urine for long periods of time.  Empty your bladder before and after sexual intercourse.  After a bowel movement, women should cleanse from front to back. Use each tissue only once. SEEK MEDICAL CARE IF:   You have back pain.  You develop a fever.  Your symptoms do not begin to resolve within 3 days. SEEK IMMEDIATE MEDICAL CARE IF:   You have severe back pain or lower abdominal pain.  You develop chills.  You have nausea or vomiting.  You have continued burning or discomfort with urination. MAKE SURE YOU:   Understand these instructions.  Will watch your condition.  Will get help right away if you are not doing well or get worse. Document Released: 07/14/2005 Document Revised: 04/04/2012 Document Reviewed: 11/12/2011 [facility] Patient Information 2015 [place]Care, [place]LL[place]. This information is not intended to replace advice given to  you by your health care provider. Make sure you discuss any questions you have with your health care provider.

## 2014-04-07 NOTE — ED Notes (Signed)
Pt belongings taken to psy ED

## 2014-04-07 NOTE — BH Assessment (Signed)
[facility]/per[facility]-natal, per Marchelle FolksAmanda. Possible discharges on Monday, 04/08/14.  -Dossie ArbourAnthony Loa Idler, MA  Disposition MHT

## 2014-04-07 NOTE — ED Notes (Signed)
Explained to pt husband that she needed to be changed out into blue scrubs and yellow socks, husband stated that he will try to get her to change

## 2014-04-07 NOTE — BH Assessment (Addendum)
Assessment Note  Catherine Gomez is an 23 y.o. female. Pt did not respond to this Clinical research associatewriter or her husband or children during interview--pt stared ahead at wall and sang a song in her native language, Hausa.   This Clinical research associatewriter collected the following information from pt's husband. Pt's second child was born six month's ago. Now, pt sleeps only 1-2 hours a night, refuses to eat (and when she does eat will only eat milk or yogurt) and she cries frequently. Pt was brought into ED today for weakness after refusing to eat for several days straight. Husband states does not want to be physically close to her children or hold them, which is different from her first pregnancy. Husband states she also displays manic behaviors--suddenly running out of the home without warning or reason, starting to sing and refusing to stop, and taking all her clothes off. Husband states that she has no history of mental health problems, and no family mental health history.  Pt does not have a psychiatrist or PCP. Pt[facility]  [place]. Husband states pt has no friends here, but [place] Husband states she has no support besides him in terms of raising the children. Pt and husband are Muslim. It is currently Ramadan.  Pt recommends for inpatient psychiatric treatment. Pt would also benefit from further psychosocial supports at d/c (outpatient mental health, PCP/healthcare, social supports).  Axis I: Depression, Post-Partum and Rule Out Psychosis, Post-Partum Axis II: Deferred Axis III:  Past Medical History  Diagnosis Date  . Extended spectrum beta lactamase (ESBL) resistance   . Headache(784.0)    Axis IV: other psychosocial or environmental problems and problems related to social environment Axis V: 21-30 behavior considerably influenced by delusions or hallucinations OR serious impairment in judgment,  communication OR inability to function in almost all areas  Past Medical History:  Past Medical History  Diagnosis Date  . Extended spectrum beta lactamase (ESBL) resistance   . ZOXWRUEA(540.9Headache(784.0)     Past Surgical History  Procedure Laterality Date  . No past surgeries      Family History:  Family History  Problem Relation Age of Onset  . Alcohol abuse Neg Hx   . Arthritis Neg Hx   . Asthma Neg Hx   . Birth defects Neg Hx   . Cancer Neg Hx   . COPD Neg Hx   . Depression Neg Hx   . Diabetes Neg Hx   . Drug abuse Neg Hx   . Early death Neg Hx   . Hearing loss Neg Hx   . Heart disease Neg Hx   . Hyperlipidemia Neg Hx   . Hypertension Neg Hx   . Kidney disease Neg Hx   . Learning disabilities Neg Hx   . Mental illness Neg Hx   . Mental retardation Neg Hx   . Miscarriages / Stillbirths Neg Hx   . Stroke Neg Hx   . Vision loss Neg Hx     Social History:  reports that she has never smoked. She has never used smokeless tobacco. She reports that she does not drink alcohol or use illicit drugs.  Additional Social History:     CIWA: CIWA-Ar BP: 127/74 mmHg Pulse Rate: 108 COWS:    Allergies: No Known Allergies  Home Medications:  (Not in a hospital admission)  OB/GYN Status:  Patient's last menstrual period was 03/31/2014.  General Assessment Data Location of Assessment: [facility] ACT Assessment: Yes Is this a Tele or Face-to-Face Assessment?: Face-to-Face Is this an Initial Assessment or a Re-assessment for this encounter?: Initial Assessment Living Arrangements: Spouse/significant other;Children Can pt return to current living arrangement?: Yes     [facility] Living Arrangements: Spouse/significant other;Children Name of Psychiatrist: none Name of Therapist: none  Education Status Is patient currently in school?: No  Risk to self Is patient at risk for suicide?: No Substance abuse history and/or treatment for substance abuse?: No      Psychosis Delusions: Unspecified        ADLScreening [facility]) Patient's cognitive ability adequate to safely complete daily activities?: Yes Patient able to express need for assistance with ADLs?: Yes Independently performs ADLs?: Yes (appropriate for developmental age)        ADL Screening (condition at time of admission) Patient's cognitive ability adequate to safely complete daily activities?: Yes Patient able to express need for assistance with ADLs?: Yes Independently performs ADLs?: Yes (appropriate for developmental age)         Values / Beliefs Cultural Requests During Hospitalization: None Spiritual Requests During Hospitalization: None              Disposition:  Disposition Initial Assessment Completed for this Encounter: Yes Disposition of Patient: Inpatient treatment program Type of inpatient treatment program: Adult  On Site Evaluation by:   Reviewed with Physician:    York SpanielFOX, ALEXANDRA LEWIS 04/07/2014 3:43 PM

## 2014-04-07 NOTE — BH Assessment (Signed)
[facility], and [facility].  Dossie Arbour-Anthony Adams, MA  Disposition MHT

## 2014-04-07 NOTE — ED Notes (Addendum)
Pt who speaks no English, presents with complaint of not eating, only drinking water, behavior change after birth of child, 6 mos ago.  Pt resting at present, calm & cooperative, no distress noted.  Pt speaks (Hausa).

## 2014-04-08 ENCOUNTER — Inpatient Hospital Stay (HOSPITAL_COMMUNITY): Admission: AD | Admit: 2014-04-08 | Payer: Medicaid Other | Source: Intra-hospital | Admitting: Psychiatry

## 2014-04-08 DIAGNOSIS — F29 Unspecified psychosis not due to a substance or known physiological condition: Secondary | ICD-10-CM | POA: Diagnosis present

## 2014-04-08 LAB — CBC WITH DIFFERENTIAL/PLATELET
BASOS ABS: 0 10*3/uL (ref 0.0–0.1)
Basophils Relative: 0 % (ref 0–1)
EOS PCT: 1 % (ref 0–5)
Eosinophils Absolute: 0.1 10*3/uL (ref 0.0–0.7)
HEMATOCRIT: 33.7 % — AB (ref 36.0–46.0)
Hemoglobin: 10.4 g/dL — ABNORMAL LOW (ref 12.0–15.0)
LYMPHS ABS: 2.9 10*3/uL (ref 0.7–4.0)
LYMPHS PCT: 25 % (ref 12–46)
MCH: 25.7 pg — ABNORMAL LOW (ref 26.0–34.0)
MCHC: 30.9 g/dL (ref 30.0–36.0)
MCV: 83.4 fL (ref 78.0–100.0)
MONO ABS: 0.6 10*3/uL (ref 0.1–1.0)
MONOS PCT: 6 % (ref 3–12)
Neutro Abs: 7.8 10*3/uL — ABNORMAL HIGH (ref 1.7–7.7)
Neutrophils Relative %: 68 % (ref 43–77)
Platelets: 220 10*3/uL (ref 150–400)
RBC: 4.04 MIL/uL (ref 3.87–5.11)
RDW: 13.6 % (ref 11.5–15.5)
WBC: 11.5 10*3/uL — ABNORMAL HIGH (ref 4.0–10.5)

## 2014-04-08 MED ORDER — OLANZAPINE 5 MG PO TBDP
5.0000 mg | ORAL_TABLET | Freq: Once | ORAL | Status: AC
  Administered 2014-04-08: 5 mg via ORAL
  Filled 2014-04-08: qty 1

## 2014-04-08 MED ORDER — CEPHALEXIN 500 MG PO CAPS
500.0000 mg | ORAL_CAPSULE | Freq: Three times a day (TID) | ORAL | Status: DC
  Administered 2014-04-08 – 2014-04-10 (×4): 500 mg via ORAL
  Filled 2014-04-08 (×5): qty 1

## 2014-04-08 NOTE — ED Notes (Signed)
Pt is non-English speaking and I cannot fully assess. She is sitting in bed chanting and rocking. She does not appear to be in acute distress and no subjective signs of pain/discomfort. Will assess further when translator is available.

## 2014-04-08 NOTE — Consult Note (Signed)
[facility] Face-to-Face Psychiatry Consult   Reason for Consult:  Postpartum depression with psychosis  Referring Physician:  EDP  Catherine Gomez is an 23 y.o. female. Total Time spent with patient: 2 hours  Assessment: AXIS I:  Psychotic Disorder NOS and Postpartum Depression AXIS II:  Deferred AXIS III:   Past Medical History  Diagnosis Date  . Extended spectrum beta lactamase (ESBL) resistance   . Headache(784.0)    AXIS IV:  other psychosocial or environmental problems and problems with access to health care services AXIS V:  21-30 behavior considerably influenced by delusions or hallucinations OR serious impairment in judgment, communication OR inability to function in almost all areas  Plan:  Recommend psychiatric Inpatient admission when medically cleared.  Subjective:   Catherine Gomez is a 23 y.o. female patient admitted with Psychotic Disorder NOS and Postpartum Depression .  HPI:  Patient sitting up in bed staring straight ahead singing a song in her native language and rocking back and forth.  Patient has emesis bag that she is using to spit in and patient would also blow air out through nose.  Patient would not answer any questions.  Patient husband at bedside states "She has been here for 3 years.  When she first came here she was fine.  When she had the baby September 2015 everything changed.  She has not been the same since she had the baby.  I have taken her to Dr. Ruthann Cancer and he said that he can't do anything until approved by Medicaid and tell me to take her to doctor.  I take her to doctor and tell me to take her to another doctor; I don't understand; I been trying to get her help and nobody will help.  After the baby was born she started to get depressed.  The depression started getting worse for the last 3 months she won't eat nothing but drink water, milk and sometime yogurt.  She say that our God is telling her to throw away her cloths and do not touch our  children.  She say that someone is trying to poison her; Sometimes I think she means me; but she didn't say me.  She will wake up crying.  Now she has really gotten worse and all I want if for someone to help [place]."  Patient states that their religion is Muslim and native language is [place].  HPI Elements:   Location:  Postpartum Depression and Psychosis. Quality:  Withdrawn and hallucination. Severity:  Not eating or caring for self or children. Duration:  Started with depression 6 months ago and progressed to not eating or caring for self or children 3 months ago. Context:  3 months of not eating, not caring for self or children, auditory and visual hallucinations. Family History  Problem Relation Age of Onset  . Alcohol abuse Neg Hx   . Arthritis Neg Hx   . Asthma Neg Hx   . Birth defects Neg Hx   . Cancer Neg Hx   . COPD Neg Hx   . Depression Neg Hx   . Diabetes Neg Hx   . Drug abuse Neg Hx   . Early death Neg Hx   . Hearing loss Neg Hx   . Heart disease Neg Hx   . Hyperlipidemia Neg Hx   . Hypertension Neg Hx   . Kidney disease Neg Hx   . Learning disabilities Neg Hx   . Mental illness Neg Hx   . Mental retardation Neg Hx   .  Miscarriages / Stillbirths Neg Hx   . Stroke Neg Hx   . Vision loss Neg Hx   Review of Systems  Unable to perform ROS: acuity of condition  Psychiatric/Behavioral: Positive for depression and hallucinations. Negative for substance abuse. Suicidal ideas: Patient unable to answer question.   Family History  Problem Relation Age of Onset  . Alcohol abuse Neg Hx   . Arthritis Neg Hx   . Asthma Neg Hx   . Birth defects Neg Hx   . Cancer Neg Hx   . COPD Neg Hx   . Depression Neg Hx   . Diabetes Neg Hx   . Drug abuse Neg Hx   . Early death Neg Hx   . Hearing loss Neg Hx   . Heart disease Neg Hx   . Hyperlipidemia Neg Hx   . Hypertension Neg Hx   . Kidney disease Neg Hx   . Learning disabilities Neg Hx   . Mental illness Neg Hx   . Mental  retardation Neg Hx   . Miscarriages / Stillbirths Neg Hx   . Stroke Neg Hx   . Vision loss Neg Hx     Past Psychiatric History: Past Medical History  Diagnosis Date  . Extended spectrum beta lactamase (ESBL) resistance   . Headache(784.0)     reports that she has never smoked. She has never used smokeless tobacco. She reports that she does not drink alcohol or use illicit drugs. Family History  Problem Relation Age of Onset  . Alcohol abuse Neg Hx   . Arthritis Neg Hx   . Asthma Neg Hx   . Birth defects Neg Hx   . Cancer Neg Hx   . COPD Neg Hx   . Depression Neg Hx   . Diabetes Neg Hx   . Drug abuse Neg Hx   . Early death Neg Hx   . Hearing loss Neg Hx   . Heart disease Neg Hx   . Hyperlipidemia Neg Hx   . Hypertension Neg Hx   . Kidney disease Neg Hx   . Learning disabilities Neg Hx   . Mental illness Neg Hx   . Mental retardation Neg Hx   . Miscarriages / Stillbirths Neg Hx   . Stroke Neg Hx   . Vision loss Neg Hx      Living Arrangements: Spouse/significant other;Children Can pt return to current living arrangement?: Yes   Allergies:  No Known Allergies  ACT Assessment Complete:  Yes:    Educational Status    Risk to Self: Risk to self Is patient at risk for suicide?: No Substance abuse history and/or treatment for substance abuse?: No  Risk to Others:    Abuse:    Prior Inpatient Therapy:    Prior Outpatient Therapy:    Additional Information:       Objective: Blood pressure 99/67, pulse 92, temperature 97.8 F (36.6 C), temperature source Oral, resp. rate 16, last menstrual period 03/31/2014, SpO2 96.00%, not currently breastfeeding.There is no weight on file to calculate BMI. Results for orders placed during the hospital encounter of 04/07/14 (from the past 72 hour(s))  URINALYSIS, ROUTINE W REFLEX MICROSCOPIC     Status: Abnormal   Collection Time    04/07/14 12:48 PM      Result Value Ref Range   Color, Urine AMBER (*) YELLOW   Comment:  BIOCHEMICALS MAY BE AFFECTED BY COLOR   APPearance CLOUDY (*) CLEAR   Specific Gravity, Urine 1.041 (*) 1.005 -  1.030   pH 5.5  5.0 - 8.0   Glucose, UA NEGATIVE  NEGATIVE mg/dL   Hgb urine dipstick TRACE (*) NEGATIVE   Bilirubin Urine SMALL (*) NEGATIVE   Ketones, ur 40 (*) NEGATIVE mg/dL   Protein, ur 100 (*) NEGATIVE mg/dL   Urobilinogen, UA 0.2  0.0 - 1.0 mg/dL   Nitrite NEGATIVE  NEGATIVE   Leukocytes, UA MODERATE (*) NEGATIVE  PREGNANCY, URINE     Status: None   Collection Time    04/07/14 12:48 PM      Result Value Ref Range   Preg Test, Ur NEGATIVE  NEGATIVE   Comment:            THE SENSITIVITY OF THIS     METHODOLOGY IS >20 mIU/mL.  URINE MICROSCOPIC-ADD ON     Status: Abnormal   Collection Time    04/07/14 12:48 PM      Result Value Ref Range   Squamous Epithelial / LPF MANY (*) RARE   WBC, UA 21-50  <3 WBC/hpf   RBC / HPF 0-2  <3 RBC/hpf   Bacteria, UA FEW (*) RARE   Urine-Other MUCOUS PRESENT    CBC WITH DIFFERENTIAL     Status: Abnormal   Collection Time    04/07/14 12:56 PM      Result Value Ref Range   WBC 11.5 (*) 4.0 - 10.5 K/uL   RBC 4.24  3.87 - 5.11 MIL/uL   Hemoglobin 10.8 (*) 12.0 - 15.0 g/dL   HCT 34.6 (*) 36.0 - 46.0 %   MCV 81.6  78.0 - 100.0 fL   MCH 25.5 (*) 26.0 - 34.0 pg   MCHC 31.2  30.0 - 36.0 g/dL   RDW 13.4  11.5 - 15.5 %   Platelets 283  150 - 400 K/uL   Neutrophils Relative % 70  43 - 77 %   Neutro Abs 8.0 (*) 1.7 - 7.7 K/uL   Lymphocytes Relative 24  12 - 46 %   Lymphs Abs 2.8  0.7 - 4.0 K/uL   Monocytes Relative 6  3 - 12 %   Monocytes Absolute 0.7  0.1 - 1.0 K/uL   Eosinophils Relative 0  0 - 5 %   Eosinophils Absolute 0.0  0.0 - 0.7 K/uL   Basophils Relative 0  0 - 1 %   Basophils Absolute 0.0  0.0 - 0.1 K/uL  COMPREHENSIVE METABOLIC PANEL     Status: Abnormal   Collection Time    04/07/14 12:56 PM      Result Value Ref Range   Sodium 144  137 - 147 mEq/L   Potassium 3.7  3.7 - 5.3 mEq/L   Chloride 102  96 - 112 mEq/L    CO2 23  19 - 32 mEq/L   Glucose, Bld 95  70 - 99 mg/dL   BUN 18  6 - 23 mg/dL   Creatinine, Ser 0.69  0.50 - 1.10 mg/dL   Calcium 9.9  8.4 - 10.5 mg/dL   Total Protein 9.1 (*) 6.0 - 8.3 g/dL   Albumin 5.1  3.5 - 5.2 g/dL   AST 32  0 - 37 U/L   Comment: HEMOLYSIS AT THIS LEVEL MAY AFFECT RESULT   ALT 23  0 - 35 U/L   Alkaline Phosphatase 87  39 - 117 U/L   Total Bilirubin 0.4  0.3 - 1.2 mg/dL   GFR calc non Af Amer >90  >90 mL/min   GFR calc Af  Amer >90  >90 mL/min   Comment: (NOTE)     The eGFR has been calculated using the CKD EPI equation.     This calculation has not been validated in all clinical situations.     eGFR's persistently <90 mL/min signify possible Chronic Kidney     Disease.  TSH     Status: None   Collection Time    04/07/14 12:56 PM      Result Value Ref Range   TSH 2.080  0.350 - 4.500 uIU/mL   Comment: Performed at [facility], ED     Status: Abnormal   Collection Time    04/07/14  2:38 PM      Result Value Ref Range   Sodium 144  137 - 147 mEq/L   Potassium 3.2 (*) 3.7 - 5.3 mEq/L   Chloride 105  96 - 112 mEq/L   BUN 16  6 - 23 mg/dL   Creatinine, Ser 0.80  0.50 - 1.10 mg/dL   Glucose, Bld 110 (*) 70 - 99 mg/dL   Calcium, Ion 1.16  1.12 - 1.23 mmol/L   TCO2 21  0 - 100 mmol/L   Hemoglobin 12.2  12.0 - 15.0 g/dL   HCT 36.0  36.0 - 46.0 %   Labs are reviewed see abnormal values above.  Medications revived.  Will start Zyprexa Zydis 5 mg now and then 5 mg Q hs   Current Facility-Administered Medications  Medication Dose Route Frequency Catherine Gomez Last Rate Last Dose  . 0.9 %  sodium chloride infusion   Intravenous Once Tanna Furry, MD      . OLANZapine zydis [facility]) disintegrating tablet 5 mg  5 mg Oral QHS Shuvon Rankin, NP   5 mg at 04/07/14 1904   Current Outpatient Prescriptions  Medication Sig Dispense Refill  . Iron-FA-B Cmp-C-Biot-Probiotic (FUSION PLUS) CAPS Take 1 tablet by mouth daily.      Marland Kitchen levonorgestrel-ethinyl  estradiol (JOLESSA) 0.15-0.03 MG tablet Take 1 tablet by mouth daily.      . nitrofurantoin, macrocrystal-monohydrate, (MACROBID) 100 MG capsule Take 1 capsule (100 mg total) by mouth 2 (two) times daily.  20 capsule  0    Psychiatric Specialty Exam:     Blood pressure 99/67, pulse 92, temperature 97.8 F (36.6 C), temperature source Oral, resp. rate 16, last menstrual period 03/31/2014, SpO2 96.00%, not currently breastfeeding.There is no weight on file to calculate BMI.  General Appearance: Casual  Eye Contact::  None  Speech:  Clear and Coherent and Patient only sing.  Speach is clear, normal rate  Volume:  Normal  Mood:  Unable to determine mood of patient at this time.  Patient is not talking to anyone  Affect:  Depressed and Flat  Thought Process:  Unable to determine mood of patient at this time.  Patient is not talking to anyone  Orientation:  Other:  Unable to determine mood of patient at this time.  Patient is not talking to anyone  Thought Content:  Hallucinations: Auditory Command:   Tell her not to touch her children, someone is trying to poison her,  and to throw away cloths,      Husband thinks that patient may also be having visual hallucinations but not sure  Suicidal Thoughts:  Yes.  with intent/plan   Homicidal Thoughts:  No    Memory:  Unable to determine at this time.   Judgement:  Impaired  Insight:  Unable to determine at this time.  Psychomotor Activity:  Restlessness  Concentration:  Unable to determine at this time.   Recall:  Unable to determine at this time.   Fund of Knowledge:Unable to determine at this time.   Language: Patient does not speak English only [place]  Akathisia:  Negative  Handed:  Right  AIMS (if indicated):     Assets:  Housing Social Support  Sleep:      Musculoskeletal: Strength & Muscle Tone: within normal limits Gait & Station: normal Patient leans: N/A  Treatment Plan Summary: Daily contact with patient to assess and  evaluate symptoms and progress in treatment Medication management Inpatient treatment recommended.  Will monitor for safety and stabilization until inpatient hospital bed is found. Zyprexa Zydis started.  Patient will also need an interpreter for programing and meeting with psychiatrist.   Will continue with current plan for inpatient treatment. Consults SW to find children car seats [employer] [place] program [facility] program.  Zadie Rhine [place], FNP-BC 04/08/2014 11:42 AM  I have personally seen the patient and agreed with the findings and involved in the treatment plan. Berniece Andreas, MD

## 2014-04-08 NOTE — ED Notes (Signed)
Patient asleep. Unable to obtain vitals at this time.

## 2014-04-08 NOTE — Progress Notes (Addendum)
CSW spoke with language resources and interpreter for [place] is available. CSW awaiting return call when interpreter is able to come.   Byrd HesselbachKristen Reed, LCSW [phone]  ED CSW 04/08/2014 8:58am   Interpreter to arrive between 930 and 945 am. [facility].   Byrd HesselbachKristen Reed, LCSW [phone]  ED CSW 04/08/2014 858am

## 2014-04-08 NOTE — ED Notes (Signed)
Pt up to bathroom to relieve self.  Observed pt spitting and gagging in sink.

## 2014-04-08 NOTE — ED Notes (Signed)
Translator present. According to translator, pt believes she is here r/t a headache. She is aware of her situation and surrounding. She did not endorse pain or discomfort. She states she has been fasting, but today she is not. Offered meal choices in effort to get pt to eat. She agreed to a select meal which was ordered for lunch. Will encourage to eat her meal and will continue to monitor.

## 2014-04-08 NOTE — Progress Notes (Signed)
  CARE MANAGEMENT ED NOTE 04/08/2014  Patient:  Catherine Gomez,Catherine Gomez   Account Number:  [id number]  Date Initiated:  04/08/2014  Documentation initiated by:  Edd ArbourGIBBS,KIMBERLY  Subjective/Objective Assessment:   23 yr old [place]     Subjective/Objective Assessment Detail:   [place] [facility]  Spouse confirms pt sees Dr Francoise CeoBernard Marshall as pcp but he is listed as OB GYN provider in EPIC and as a medicaid MD on Medicaid provider list  Pt with 2 young daughters 499 months old & 2 yr old in lobby with father  Husband requesting information to take to DSS about pt condition and assist with childcare for 2 children less than 3 yrs old and 2 car seats plus other resources     Action/Plan:   ED CM spoke with pt's spouse about obtaining medical record [facility], provided a release form, With translator Cm inquired about DSS case worker for pt to assist with child care for 2 young daughters of pt, Cm spoke with ED SWx2   Action/Plan Detail:   1200 Spoke with Ms Laverle PatterBorden at [facility] [phone] & given contact #s for CPS main [phone], Carole CivilRandy Johnson [phone], [place]cott ParmaAnn Anderson [phone] to assist with childcare(funds), car seats Info given to ED SW   Anticipated DC Date:  04/08/2014     Status Recommendation to Physician:   Result of Recommendation:    Other ED Services  Consult Working Plan   In-house referral  Clinical Social Worker   DC Associate Professorlanning Services  Other  PCP issues  Outpatient Services - Pt will follow up    Choice offered to / List presented to:            Status of service:  Completed, signed off  ED Comments:   ED Comments Detail:  04/08/14  1330 Cm spoke with translator who is willing to assist Cm with explaining pcp and medicaid issues to spouse [phone] Spoke with Mrs Freida Busmanllen at Dr Gaynell Facemarshall office [phone] to confirm Dr Gaynell FaceMarshall is not a pcp only an OB GYN provider and will not see pt for  general medical needs only ob gyn needs 1245 CM assisted with providing 2 yr old daughter apple juice and crackers The spouse states he is fasting at this time 1230 Cm spoke with Erskine SquibbJane in [facility] office to confirm, the spouse is not able to obtain medical records on the pt unless he has a POA form or the pt was Alert and oriented enough to complete the process/forms herself. Erskine SquibbJane consulted with Inetta Fermoina in HIM who works primarily with [facility] patients 1215 CM spoke with staff at [facility], [facility] (wanda[facility] and [facility] (theresa)to confirm they are all accepting new patients but their name must be on pt medicaid card prior to an appointment and forms completed for new patient services

## 2014-04-08 NOTE — BHH Counselor (Signed)
[facility], but Shuvon Rankin NP feels it would be best if pt stays in [facility] overnight for observation. Pt to stay in [facility] and pt will be evaluated by psychiatry in the am.  Evette Cristalaroline Paige McLean, LCSWA Assessment Counselor

## 2014-04-08 NOTE — Progress Notes (Signed)
[facility].  [facility] coordinator at [phone] to review this AM.  [facility] will callback after reviewed after 10am.  Blain PaisMichelle L Wael Maestas, MHT/NS

## 2014-04-08 NOTE — Progress Notes (Signed)
Please continue to arrange Hadi from [employer] to continue to follow patient. It has been arranged for her to return daily at 845-12 1:15 and 8pm [facility] however this time can be adjusted please  call office inclusion at [phone] with [facility].   Byrd HesselbachKristen Reed, LCSW [phone]  ED CSW 04/08/2014 1327pm

## 2014-04-09 MED ORDER — STERILE WATER FOR INJECTION IJ SOLN
INTRAMUSCULAR | Status: AC
  Filled 2014-04-09: qty 10

## 2014-04-09 MED ORDER — FAMOTIDINE 20 MG PO TABS
20.0000 mg | ORAL_TABLET | Freq: Two times a day (BID) | ORAL | Status: DC
  Administered 2014-04-09 – 2014-04-10 (×2): 20 mg via ORAL
  Filled 2014-04-09 (×2): qty 1

## 2014-04-09 NOTE — Progress Notes (Signed)
CSW contacted [facility] to inquire about referral made on 04/07/14. CSW spoke with staff member Lorin PicketScott who stated that referral was not reviewed due to no bed availability. No bed availability at this time but staff member encouraged CSW to call back this evening around 4-5 pm to inquire about bed availability. [employer] traderstaff member Scott encouraged CSW to make referral if bed available.   [facility] at capacity but CSW encouraged to send another referral for review.   [facility] at capacity and not accepting referrals.  [facility]. CSW not able to speak with intake staff but encouraged to send referral for review.   Catherine Gomez, MSW, [facility]WA Clinical Social Worker [facility] [phone]

## 2014-04-09 NOTE — ED Notes (Signed)
Patient is eating her watermelon at this time. Using an interpreter the patient reports that she likes watermelon. Patient smiling while eating. Writer praises patient for eating.

## 2014-04-09 NOTE — ED Notes (Addendum)
Patient was escorted back to unit and then off again accompanied by MHT and security. AC contacted with update on patient and informed of visitation off unit.  AC recommended MD orders for further requests that are outside unit rules.  Patient with CSW visiting daughter.

## 2014-04-09 NOTE — ED Notes (Signed)
Explained to patient via interpeter the she would be allowed three showers.  Allowed prayer mat in room.  Patient continues to refuse solid food stating that she "will eat when the rain stops."

## 2014-04-09 NOTE — Treatment Plan (Signed)
Dr. Jannifer FranklinAkintayo does not think that pt is [facility] at this time due to refusing to eat.  Please continue to encourage to take medication and eat.

## 2014-04-09 NOTE — Progress Notes (Addendum)
[facility]. CSW confirmed receipt of referral. [place] authorization [id number] from 04/09/14 through 04/15/14. UDS still pending.  Samuella BruinKristin Drinkard, MSW, [facility]WA Clinical Social Worker [facility] [phone]

## 2014-04-09 NOTE — ED Notes (Signed)
128 lbs (actual)

## 2014-04-09 NOTE — ED Provider Notes (Addendum)
11:50- I was asked to see patient, for concern of poor oral intake. Patient interviewed, with fluent interpreter. The patient states that she is thirsty, but will not drink. She complains of pain in her heart. She has been evaluated by psychiatry, who would like to admit her, but are concerned, that she may be dehydrated.  Exam- alert, cooperative. Eyes have pink mucous membranes and nonicteric sclera. Mouth is moist. There are no oral lesions.  Heart regular rate and rhythm. No murmur. Lungs clear. Abdomen is nontender. Skin has normal turgor.  Medications  0.9 %  sodium chloride infusion ( Intravenous Not Given 04/07/14 1848)  OLANZapine zydis (ZYPREXA) disintegrating tablet 5 mg (5 mg Oral Given 04/08/14 2153)  cephALEXin (KEFLEX) capsule 500 mg (500 mg Oral Given 04/08/14 2153)  sterile water (preservative free) injection (not administered)  sodium chloride 0.9 % bolus 1,000 mL (0 mLs Intravenous Stopped 04/07/14 1641)  cefTRIAXone (ROCEPHIN) 1 g in dextrose 5 % 50 mL IVPB (0 g Intravenous Stopped 04/07/14 1641)  OLANZapine zydis (ZYPREXA) disintegrating tablet 5 mg (5 mg Oral Given 04/08/14 1341)    Patient Vitals for the past 24 hrs:  BP Temp Temp src Pulse Resp SpO2  04/09/14 0638 106/68 mmHg 98.8 F (37.1 C) Oral 90 18 100 %  04/08/14 2120 121/79 mmHg 98.7 F (37.1 C) Oral 103 20 100 %  04/08/14 1212 113/73 mmHg 99 F (37.2 C) Oral 106 16 100 %     Assessment: Psychiatric illness with decreased oral intake. Vital signs are normal. There are no objective signs for dehydration. Interestingly, the patient has requested to take multiple showers each day. I wonder if she is drinking water, in the shower? She stable for treatment in a psychiatric facility.   Flint MelterElliott L Wentz, MD 04/09/14 1622  Flint MelterElliott L Wentz, MD 04/10/14 [phone]

## 2014-04-09 NOTE — Progress Notes (Signed)
04/09/14 1245 ED CM spoke with pt, spouse and interpreter, Heidi, about not being able to see Dr Gaynell FaceMarshall as pcp any longer.  Discuss need to find a new medicaid provider [place] ( Asterisk beside those CM contacted) Cm shared with them that Cm called various providers on the list who are accepting new patients Provided DSS number [phone] to contact them to added new provider on medicaid card Husband voiced understanding without further questions

## 2014-04-09 NOTE — Progress Notes (Signed)
CSW received a message from [place] intake with [facility] who requested a HIV test and IVC paperwork for the patient.  CSW called and spoke with Sharyne Peach to understand the reason for the HIV test requested by the doctor.  Sharyne Peach reports that the doctor wrote the order and left the facility and she do not know at this time.  CSW informed Dr. Delane Ginger of the request for the test and he reviewed the patient's medical record to see that the patient had been tested in 2012 with no recent high risk factors for a new test.  The patient had her 12 old child child in the [facility] system and again no other risk factors.  Dr. Delane Ginger said he would do the test if it was absolutely necessary and CSW will call in the morning to speak with the [facility] medical staff to get the justification for the test.  CSW spoke with the patient while the interpreter was present to inform her of the request for the testing.  The patient stated that she is not HIV positive and do not need a test to be taken.  She stated that she is dehydrated and would probably not be able to provide any blood.  Patient did agree to take the test if necessary to get her out of the hospital faster.  The interpreter was the witness to this verbal consent.    6:00pm-CSW met with patient and interpreter to introduce self and inform her of the visitation. CSW spoke with Otho Perl, RN for instruction on the visitation as per shift change she is the charge nurse and would know the designated meeting area.  CSW spoke with security and met with the husband in the consultation room to explain the rules of the meeting.  CSW informed the husband that this meeting was approved but can not be guaranteed to happen again.  The husband brought the patient food to the unit but CSW informed the husband the this again is only approved for this one time and can not be guaranteed to happen again.  CSW shared her concerns with the meeting between the patient and the children the pros and cons.  The  husband expressed his understanding but he states he is just overwhelmed and do not know what to do.  He states that he is looking for someone to babysit for his children but DSS can not help and he has no friends of family that will help right now.  CSW validated his feeling and expressed some empathy for him being overwhelmed but also shared with him the seriousness of the condition his wife is in.    CSW went to get the patient, interpreter, and security for the visitation.  The patient on the way to the visitation room stopped and said she do not want to see the youngest child but was willing to see the oldest.  CSW instructed the security to take the patient back on the unit until CSW could explain this to the husband.  CSW explained the situation to the husband and he removed himself and the youngest child out of the room.  The patient , interpreter, security, and tech came back to the visitation room.  The patient partially acknowledged the child, she would just chant her prayers.  CSW attempted to engaged the patient to the child by asking the child's name.  The patient would say, "I know her name".  The patient later told CSW the child's name and became more  engaged with the child.  The child made several attempt to show her mother that they had brought her some food here.  The patient acknowledged it and they sat down together.  The patient requested to see her husband but did not want to see her youngest child again.  The interpreter informed her that visitation is over at 9pm so that may be impossible.  The patient asked for the visitation to be over and CSW took the child back to her father.    7:40pm-Per the interpreter the patient will take a shower after prayer at 8:30pm and then will eat her food brought in by her husband.  CSW inform the nursing staff of the request for shower.    8:36pm- CSW spoke with Dr. Delane Ginger as he approved the patient's request for bottled water.  CSW informed nursing  staff that the patient can have bottled water.     Chesley Noon, MSW, [place], 04/09/2014 Evening Clinical Social Worker [phone]

## 2014-04-09 NOTE — Progress Notes (Addendum)
Pt husband to visit patient. Patient to call patient mother in [place] with pt husband phone with international access. Rn aware. Patient husband to take phone home with him when completed.   [facility]   Byrd HesselbachKristen Helia Haese, LCSW [phone]  ED CSW 04/09/2014 1326pm

## 2014-04-09 NOTE — Consult Note (Signed)
  Psychiatric Specialty Exam: Physical Exam  ROS  Blood pressure 99/65, pulse 94, temperature 98 F (36.7 C), temperature source Oral, resp. rate 16, last menstrual period 03/31/2014, SpO2 100.00%, not currently breastfeeding.There is no weight on file to calculate BMI.  General Appearance: Casual  Eye Contact::  Minimal  Speech:  Clear and Coherent  Volume:  Normal  Mood:  flat  Affect:  Flat  Thought Process:  believes she is dying and is constantly saying prayers out loud apparently because of that  Orientation:  Full (Time, Place, and Person)  Thought Content:  Delusions and Paranoid Ideation  Suicidal Thoughts:  No  Homicidal Thoughts:  No  Memory:  Immediate;   Good Recent;   Good Remote;   Good  Judgement:  Impaired  Insight:  Lacking  Psychomotor Activity:  Normal  Concentration:  Good  Recall:  Good  Akathisia:  Negative  Handed:  Right  AIMS (if indicated):   0  Assets:  ArchitectCommunication Skills Financial Resources/Insurance Housing Intimacy Physical Health Social Support  Sleep:   adequate  Ms Arlyn LeakBacharou remains delusional in that she believes she is dying in spite of being reassured she is not.  She has been paranoid that her food is being poisoned but did not say that today.  She continues to eat only yogurt and drink milk and water.  Her husband is coming today and bringing the children and we will see how she handles that.  The plan is to continue looking for an inpatient bed to treat her psychosis.

## 2014-04-09 NOTE — Progress Notes (Signed)
Pt spouse and family concerned about patient not eating. Pt spouse wondering if we would be able to force feed patient, or feeding tube. Pt EDP evaluated patient and did not feel that patient needed alternative nutritional measures at this time. CSW and pt spouse discussed. Patient spouse plans to come visit with pt children at 6pm. CSW to meet with pt in conference room to see children briefly.   Byrd HesselbachKristen Reed, LCSW [phone]  ED CSW 04/09/2014 1358pm

## 2014-04-09 NOTE — Progress Notes (Signed)
Attempted to Secure Placement:   [facility]- faxed information [facility]- faxed information [facility]- Raynelle FanningJulie- faxed information to [phone]. She said the last referral was canceled on 04/08/14.    Maryelizabeth Rowanressa Corbett, MSW, [place], 04/09/2014 Evening Clinical Social Worker [phone]

## 2014-04-09 NOTE — Progress Notes (Signed)
CSW spoke with Morrie SheldonAshley from [facility] who declined patient because of adult mcd no accepted at this facility.  CSW spoke with Ray[facility]Julie [facility] who reports no beds at this but to call back in the morning to resubmit the review.  She state that the referral packet will stay in their system so it will not need to be faxed but just called to be requested again over the phone.       Maryelizabeth Rowanressa Corbett, MSW, [place]CS[place], 04/09/2014 Evening Clinical Social Worker [phone]

## 2014-04-09 NOTE — Progress Notes (Addendum)
Pt family assisted with [employer] to assist with car seats. Pt husband able to get infant car seat and toddler car seat. CSW assisted with petty cash fund for toddler seat.  Patient husband requesting for pt daughter to see pt. Per discussion with psychiatrist they feel a short visit in a conference room is appropriate at this time.  With assistance from interpreter. Pt reports hearing it rain, and is not going to eat until the rain stops. Pt wants to shower, and these were limited to 3 showers per day which was agreeable to patient.    Byrd HesselbachKristen Reed, LCSW [phone]  ED CSW 04/09/2014 1259pm

## 2014-04-10 LAB — URINE CULTURE: Colony Count: 30000

## 2014-04-10 MED ORDER — ACETAMINOPHEN 325 MG PO TABS
650.0000 mg | ORAL_TABLET | Freq: Four times a day (QID) | ORAL | Status: DC | PRN
  Administered 2014-04-10: 650 mg via ORAL
  Filled 2014-04-10: qty 2

## 2014-04-10 NOTE — ED Notes (Signed)
Patient given fruit and naked juice to drank. Husband reports that patient only eats from 8:40 pm to 3:40 am. He reports that she prays 5 times a day: 8:45 pm, 10:15 pm, 5:00 am, 1:30 pm, 5:15 pm.

## 2014-04-10 NOTE — ED Notes (Signed)
Patient drank 8 oz naked juice and tolerated it well.

## 2014-04-10 NOTE — Consult Note (Addendum)
  Psychiatric Specialty Exam: Physical Exam  ROS  Blood pressure 114/83, pulse 105, temperature 98.1 F (36.7 C), temperature source Oral, resp. rate 20, last menstrual period 03/31/2014, SpO2 100.00%, not currently breastfeeding.There is no weight on file to calculate BMI.  General Appearance: Fairly Groomed  Patent attorneyye Contact::  Good  Speech:  Clear and Coherent  Volume:  Normal  Mood:  Depressed  Affect:  Depressed  Thought Process:  not volunteering or admitting to hallucinations  Orientation:  Full (Time, Place, and Person)  Thought Content:  guarded and will not talk about certain things  Suicidal Thoughts:  No  Homicidal Thoughts:  No  Memory:  cannot judge  Judgement:  Impaired  Insight:  Lacking  Psychomotor Activity:  Normal  Concentration:  Good  Recall:  cannot judge  Akathisia:  Negative  Handed:  Right  AIMS (if indicated):     Assets:  ArchitectCommunication Skills Financial Resources/Insurance Housing Intimacy Physical Health Social Support Transportation  Sleep:   good  Catherine Gomez is talking today in [place] and is appropriate.  She got uncomfortable when asked about the visit with her daughter yesterday and more so when asked about the infant daughter she did not want to see.  Consequently the plan remains [facility] when a bed is available adn she is agreeable with that plan.  With the interpreter she said with emotion that she loves both her babies.  I still could not understand why the infant is more an issue and she did not explain that.  She says that she is a Muslim and will not take medications.  [facility].  Will talk with her husband as to whether [facility].

## 2014-04-10 NOTE — ED Notes (Addendum)
Pt able to answer some assessment questions and apparently speaks more English than believed. She was able to communicate pain in her head and that she doesn't fell well in [place]. She did struggle to answer complicated assessment questions. Unable to fully assess. Will assess further when translator arrives.

## 2014-04-10 NOTE — ED Notes (Signed)
Pt transported to [facility] by [employer]  for continuation of specialized care. SHe left in no acute distress.

## 2014-04-10 NOTE — ED Notes (Signed)
Patient took a shower and after shower patient left towels and dirty scrubs on the floor. Writer ask patient using interpreter to please pick up her dirty clothing and and discard them in the trash and laundry bin. As patient was picking up her things she kept saying " this is not for me" she repeated this 4 times.

## 2014-04-10 NOTE — ED Notes (Signed)
Patient was giving watermelon and she ate them.

## 2014-04-10 NOTE — Progress Notes (Signed)
Interpreter from [employer] expected to arrive around 10:30 am.   Samuella BruinKristin Ezra Marquess, MSW, [facility] Clinical Social Worker [facility] [phone]

## 2014-04-10 NOTE — BH Assessment (Signed)
[facility] Assessment Progress Note      [facility] by Dr Barbie BannerMary Kimmell.  She will go by [employer] [facility] services after her husband visits around 2:00 pm.  Faxed facesheet.  Pt will go to the [employer] [facility]k in front of the hospital where her paperwork will be ready.  She reports that number for report is [phone].  Pt was made IVC.

## 2014-04-10 NOTE — BH Assessment (Signed)
[facility] Assessment Progress Note    Received call from Ava Carmela HurtStephenson stating that Dr Jannifer FranklinAkintayo declined the patient due to not eating.  Made inquiry with [facility] perinatal unit to see if pt referral is still in place.  Spoke with Antionette CharLori Gardner who reports the referral needs to be refaxed.  Unsure of bed availability they will follow up this afternoon. Referral faxed at 08:28

## 2014-04-10 NOTE — Progress Notes (Signed)
[facility]. CSW spoke with pt spouse and is in agreemnt. Pt spouse to come visit patient at 2pm. Patient to be transported voluntarily by [facility]. TTS to provide further details.   Byrd HesselbachKristen Reed, LCSW [phone]  ED CSW 04/10/2014 1321pm

## 2014-04-11 ENCOUNTER — Telehealth (HOSPITAL_COMMUNITY): Payer: Self-pay

## 2014-04-11 NOTE — ED Notes (Signed)
Post ED Visit - Positive Culture Follow-up  Culture report reviewed by antimicrobial stewardship pharmacist: []  Marlou SaWes Dulaney, Pharm.D., BCPS []  Celedonio MiyamotoJeremy Frens, Pharm.D., BCPS []  Georgina PillionElizabeth Martin, Pharm.D., BCPS [x]  AshlandMinh Pham, [address] Rainbow BoulevardPharm.D., BCPS, AAHIVP []  Estella HuskMichelle Turner, Pharm.D., BCPS, AAHIVP []  Harvie JuniorNathan Cope, Pharm.D.  Positive urine culture Treated with macro bid, organism sensitive to the same and no further patient follow-up is required at this time.  Ashley JacobsFesterman, Toni C 04/11/2014, 11:41 AM

## 2014-06-01 ENCOUNTER — Emergency Department (HOSPITAL_COMMUNITY)
Admission: EM | Admit: 2014-06-01 | Discharge: 2014-06-01 | Discharge status: Against Medical Advice | Payer: Medicaid Other

## 2014-07-15 ENCOUNTER — Emergency Department (HOSPITAL_COMMUNITY)
Admission: EM | Admit: 2014-07-15 | Discharge: 2014-07-16 | Disposition: A | Discharge status: Home / Self Care | Payer: Medicaid Other | Attending: Emergency Medicine | Admitting: Emergency Medicine

## 2014-07-15 ENCOUNTER — Encounter (HOSPITAL_COMMUNITY): Payer: Self-pay | Admitting: Emergency Medicine

## 2014-07-15 ENCOUNTER — Emergency Department (HOSPITAL_COMMUNITY): Payer: Medicaid Other

## 2014-07-15 DIAGNOSIS — R51 Headache: Secondary | ICD-10-CM | POA: Insufficient documentation

## 2014-07-15 DIAGNOSIS — Z3202 Encounter for pregnancy test, result negative: Secondary | ICD-10-CM | POA: Diagnosis not present

## 2014-07-15 DIAGNOSIS — K117 Disturbances of salivary secretion: Secondary | ICD-10-CM | POA: Diagnosis not present

## 2014-07-15 DIAGNOSIS — F3289 Other specified depressive episodes: Secondary | ICD-10-CM | POA: Diagnosis present

## 2014-07-15 DIAGNOSIS — F39 Unspecified mood [affective] disorder: Secondary | ICD-10-CM | POA: Diagnosis not present

## 2014-07-15 DIAGNOSIS — G8929 Other chronic pain: Secondary | ICD-10-CM | POA: Diagnosis not present

## 2014-07-15 DIAGNOSIS — Z79899 Other long term (current) drug therapy: Secondary | ICD-10-CM | POA: Diagnosis not present

## 2014-07-15 DIAGNOSIS — F329 Major depressive disorder, single episode, unspecified: Secondary | ICD-10-CM | POA: Diagnosis present

## 2014-07-15 DIAGNOSIS — R519 Headache, unspecified: Secondary | ICD-10-CM

## 2014-07-15 DIAGNOSIS — F330 Major depressive disorder, recurrent, mild: Secondary | ICD-10-CM | POA: Insufficient documentation

## 2014-07-15 HISTORY — DX: Major depressive disorder, single episode, unspecified: F32.9

## 2014-07-15 HISTORY — DX: Depression, unspecified: F32.A

## 2014-07-15 LAB — LITHIUM LEVEL

## 2014-07-15 LAB — COMPREHENSIVE METABOLIC PANEL
ALT: 14 U/L (ref 0–35)
ANION GAP: 14 (ref 5–15)
AST: 14 U/L (ref 0–37)
Albumin: 4.3 g/dL (ref 3.5–5.2)
Alkaline Phosphatase: 64 U/L (ref 39–117)
BUN: 14 mg/dL (ref 6–23)
CALCIUM: 9.4 mg/dL (ref 8.4–10.5)
CO2: 21 meq/L (ref 19–32)
Chloride: 105 mEq/L (ref 96–112)
Creatinine, Ser: 0.65 mg/dL (ref 0.50–1.10)
GFR calc Af Amer: >90 mL/min (ref >90)
Glucose, Bld: 109 mg/dL — ABNORMAL HIGH (ref 70–99)
Potassium: 3.7 mEq/L (ref 3.7–5.3)
SODIUM: 140 meq/L (ref 137–147)
Total Bilirubin: <0.2 mg/dL — ABNORMAL LOW (ref 0.3–1.2)
Total Protein: 8.2 g/dL (ref 6.0–8.3)

## 2014-07-15 LAB — RAPID URINE DRUG SCREEN, HOSP PERFORMED
AMPHETAMINES: NOT DETECTED
Barbiturates: NOT DETECTED
Benzodiazepines: NOT DETECTED
Cocaine: NOT DETECTED
Opiates: NOT DETECTED
TETRAHYDROCANNABINOL: NOT DETECTED

## 2014-07-15 LAB — CBC
HEMATOCRIT: 31 % — AB (ref 36.0–46.0)
HEMOGLOBIN: 10.3 g/dL — AB (ref 12.0–15.0)
MCH: 30.3 pg (ref 26.0–34.0)
MCHC: 33.2 g/dL (ref 30.0–36.0)
MCV: 91.2 fL (ref 78.0–100.0)
Platelets: 194 10*3/uL (ref 150–400)
RBC: 3.4 MIL/uL — ABNORMAL LOW (ref 3.87–5.11)
RDW: 15.2 % (ref 11.5–15.5)
WBC: 8.6 10*3/uL (ref 4.0–10.5)

## 2014-07-15 LAB — ETHANOL: Alcohol, Ethyl (B): <11 mg/dL (ref 0–11)

## 2014-07-15 LAB — POC URINE PREG, ED: Preg Test, Ur: NEGATIVE

## 2014-07-15 LAB — ACETAMINOPHEN LEVEL: Acetaminophen (Tylenol), Serum: <15 ug/mL (ref 10–30)

## 2014-07-15 LAB — SALICYLATE LEVEL: Salicylate Lvl: <2 mg/dL — ABNORMAL LOW (ref 2.8–20.0)

## 2014-07-15 MED ORDER — LORAZEPAM 1 MG PO TABS: 1.0000 mg | ORAL_TABLET | Freq: Three times a day (TID) | ORAL | Status: DC | PRN

## 2014-07-15 MED ORDER — NICOTINE 21 MG/24HR TD PT24: 21.0000 mg | MEDICATED_PATCH | Freq: Every day | TRANSDERMAL | Status: DC

## 2014-07-15 MED ORDER — ONDANSETRON HCL 4 MG PO TABS: 4.0000 mg | ORAL_TABLET | Freq: Three times a day (TID) | ORAL | Status: DC | PRN

## 2014-07-15 MED ORDER — IBUPROFEN 200 MG PO TABS: 600.0000 mg | ORAL_TABLET | Freq: Three times a day (TID) | ORAL | Status: DC | PRN

## 2014-07-15 MED ORDER — ACETAMINOPHEN 325 MG PO TABS: 650.0000 mg | ORAL_TABLET | ORAL | Status: DC | PRN

## 2014-07-15 MED ORDER — ZOLPIDEM TARTRATE 5 MG PO TABS: 5.0000 mg | ORAL_TABLET | Freq: Every evening | ORAL | Status: DC | PRN

## 2014-07-15 MED ORDER — ALUM & MAG HYDROXIDE-SIMETH 200-200-20 MG/5ML PO SUSP: 30.0000 mL | ORAL | Status: DC | PRN

## 2014-07-15 NOTE — ED Notes (Signed)
Pt's husband reports pt has been increasingly depressed.  Has hx of this.  Reports she's taking her meds without relief.  States she has been to [facility] in the past for tx.  Denies SI/HI at this time.

## 2014-07-15 NOTE — ED Notes (Signed)
Husband is saying that pt needs somewhere to lay down and sleep.  Explained to him that pt is also c/o a h/a this is why the PA did a head CT and is waiting for the result before she can be moved in a room.  Dahlia Client EDPA wants pt to wait in triage until CT is resulted.

## 2014-07-15 NOTE — ED Provider Notes (Signed)
CSN: [id number]     Arrival date & time 07/15/14  1919 History   First MD Initiated Contact with Patient 07/15/14 2030   This chart was scribed for non-physician practitioner, Junious Silk PA-C working with Mirian Mo, MD, by Andrew Au, ED Scribe. This patient was seen in room WTR3/WLPT3 and the patient's care was started at 8:54 PM. Chief Complaint  Patient presents with  . Depression   The history is provided by the patient and the spouse. The history is limited by a language barrier. No language interpreter was used.    Catherine Gomez is a 23 y.o. female who presents to the Emergency Department complaining of worsening depression. Per husband, pt has hx postpartum depression and has been taking medication regularly. Despite being complaint with her medication, her depression is worsening. He reports pt has not been able to see regular doctor. Husband reports pt has had an ECT in the past. He reports pt has not been feeling well. He reports she has had a constant HA that she's had for a while and is constantly spitting up saliva. She denies any sore throat, difficulty swallowing, sensation that her throat is closing. She wonders if this could be due to her medications. Pt describes HA as her head feeling heavy.  He denies pt having SI/ HI and self injury. She denies hallucination.    Past Medical History  Diagnosis Date  . Extended spectrum beta lactamase (ESBL) resistance   . Headache(784.0)   . Depression    Past Surgical History  Procedure Laterality Date  . No past surgeries     Family History  Problem Relation Age of Onset  . Alcohol abuse Neg Hx   . Arthritis Neg Hx   . Asthma Neg Hx   . Birth defects Neg Hx   . Cancer Neg Hx   . COPD Neg Hx   . Depression Neg Hx   . Diabetes Neg Hx   . Drug abuse Neg Hx   . Early death Neg Hx   . Hearing loss Neg Hx   . Heart disease Neg Hx   . Hyperlipidemia Neg Hx   . Hypertension Neg Hx   . Kidney disease Neg Hx   .  Learning disabilities Neg Hx   . Mental illness Neg Hx   . Mental retardation Neg Hx   . Miscarriages / Stillbirths Neg Hx   . Stroke Neg Hx   . Vision loss Neg Hx    History  Substance Use Topics  . Smoking status: Never Smoker   . Smokeless tobacco: Never Used  . Alcohol Use: No   OB History   Grav Para Term Preterm Abortions TAB SAB Ect Mult Living   0 0 0 0 0 0 2     Review of Systems  HENT: Negative for sore throat.   Respiratory: Negative for cough.   Neurological: Positive for headaches.  Psychiatric/Behavioral: Positive for dysphoric mood. Negative for suicidal ideas, hallucinations and self-injury.  All other systems reviewed and are negative.  Allergies  Review of patient's allergies indicates no known allergies.  Home Medications   Prior to Admission medications   Medication Sig Start Date End Date Taking? Authorizing Provider  Iron-FA-B Cmp-C-Biot-Probiotic (FUSION PLUS) CAPS Take 1 tablet by mouth daily.    Historical Provider, MD  levonorgestrel-ethinyl estradiol (JOLESSA) 0.15-0.03 MG tablet Take 1 tablet by mouth daily.    Historical Provider, MD  nitrofurantoin, macrocrystal-monohydrate, (MACROBID) 100 MG capsule Take 1  capsule (100 mg total) by mouth 2 (two) times daily. 04/07/14   Rolland Porter, MD   BP 116/74  Pulse 71  Temp(Src) 98.2 F (36.8 C) (Oral)  Resp 16  SpO2 100% Physical Exam  Nursing note and vitals reviewed. Constitutional: She is oriented to person, place, and time. She appears well-developed and well-nourished. No distress.  HENT:  Head: Normocephalic and atraumatic.  Right Ear: External ear normal.  Left Ear: External ear normal.  Nose: Nose normal.  Mouth/Throat: Uvula is midline, oropharynx is clear and moist and mucous membranes are normal. No trismus in the jaw. No uvula swelling.  Eyes: Conjunctivae and EOM are normal. Pupils are equal, round, and reactive to light.  Neck: Normal range of motion. No rigidity.  No nuchal  rigidity or meningeal signs  Cardiovascular: Normal rate, regular rhythm, normal heart sounds, intact distal pulses and normal pulses.   Pulses:      Radial pulses are 2+ on the right side, and 2+ on the left side.       Posterior tibial pulses are 2+ on the right side, and 2+ on the left side.  Pulmonary/Chest: Effort normal and breath sounds normal. No stridor. No respiratory distress. She has no wheezes. She has no rales.  Abdominal: Soft. She exhibits no distension. There is no tenderness.  Musculoskeletal: Normal range of motion.  Neurological: She is alert and oriented to person, place, and time. She has normal strength.  Skin: Skin is warm and dry. She is not diaphoretic. No erythema.  Psychiatric: Her behavior is normal. She exhibits a depressed mood. She expresses no homicidal and no suicidal ideation. She expresses no suicidal plans and no homicidal plans.    ED Course  Procedures (including critical care time) DIAGNOSTIC STUDIES: Oxygen Saturation is 100% on RA, normal by my interpretation.    COORDINATION OF CARE: 8:53 PM- Pt advised of plan for treatment and pt agrees.   Labs Review Labs Reviewed  CBC - Abnormal; Notable for the following:    RBC 3.40 (*)    Hemoglobin 10.3 (*)    HCT 31.0 (*)    All other components within normal limits  COMPREHENSIVE METABOLIC PANEL - Abnormal; Notable for the following:    Glucose, Bld 109 (*)    Total Bilirubin <0.2 (*)    All other components within normal limits  SALICYLATE LEVEL - Abnormal; Notable for the following:    Salicylate Lvl <2.0 (*)    All other components within normal limits  LITHIUM LEVEL - Abnormal; Notable for the following:    Lithium Lvl <0.25 (*)    All other components within normal limits  ACETAMINOPHEN LEVEL  ETHANOL  URINE RAPID DRUG SCREEN (HOSP PERFORMED)  POC URINE PREG, ED    Imaging Review Ct Head Wo Contrast  07/15/2014   CLINICAL DATA:  Worsening depression.  Headache.  EXAM: CT HEAD  WITHOUT CONTRAST  TECHNIQUE: Contiguous axial images were obtained from the base of the skull through the vertex without intravenous contrast.  COMPARISON:  None.  FINDINGS: Ventricles and sulci appear symmetrical. No mass effect or midline shift. No abnormal extra-axial fluid collections. Gray-white matter junctions are distinct. Basal cisterns are not effaced. No evidence of acute intracranial hemorrhage. No depressed skull fractures. Visualized paranasal sinuses and mastoid air cells are not opacified.  IMPRESSION: No acute intracranial abnormalities.   Electronically Signed   By: Burman Nieves M.D.   On: 07/15/2014 23:50     EKG Interpretation None  MDM   Final diagnoses:  Major depressive disorder, recurrent episode, mild  Chronic nonintractable headache, unspecified headache type  Hypersalivation   Patient presents to ED for worsening depression. She has hx of post partum depression and feels her symptoms have been worsening despite being complaint with medication therapy. She has hypersalivation and headaches. Headaches have been ongoing for "awhile". Non focal neuro exam. Non concerning for SAH, meningitis, ICH. Patient is medically clear. Will have psych evaluate.   I personally performed the services described in this documentation, which was scribed in my presence. The recorded information has been reviewed and is accurate.    Mora Bellman, PA-C 07/16/14 2006

## 2014-07-16 ENCOUNTER — Encounter (HOSPITAL_COMMUNITY): Payer: Self-pay | Admitting: Registered Nurse

## 2014-07-16 DIAGNOSIS — R51 Headache: Secondary | ICD-10-CM

## 2014-07-16 DIAGNOSIS — R519 Headache, unspecified: Secondary | ICD-10-CM | POA: Diagnosis present

## 2014-07-16 DIAGNOSIS — K117 Disturbances of salivary secretion: Secondary | ICD-10-CM | POA: Diagnosis present

## 2014-07-16 DIAGNOSIS — F330 Major depressive disorder, recurrent, mild: Secondary | ICD-10-CM

## 2014-07-16 NOTE — [facility] Suicide Risk Assessment (Signed)
Suicide Risk Assessment  Discharge Assessment     Demographic Factors:  Adolescent or young adult  Total Time spent with patient: 45 minutes  Psychiatric Specialty Exam:     Blood pressure 123/81, pulse 89, temperature 98.2 F (36.8 C), temperature source Oral, resp. rate 18, SpO2 100.00%, not currently breastfeeding.There is no weight on file to calculate BMI.  General Appearance: Casual  Eye Contact::  Good  Speech:  Clear and Coherent  Volume:  Normal  Mood:  Anxious  Affect:  Congruent  Thought Process:  Coherent and Logical  Orientation:  Full (Time, Place, and Person)  Thought Content:  Negative  Suicidal Thoughts:  No  Homicidal Thoughts:  No  Memory:  Immediate;   Good Recent;   Good Remote;   Good  Judgement:  Intact  Insight:  Shallow  Psychomotor Activity:  Normal  Concentration:  Good  Recall:  Good  Fund of Knowledge:Good  Language: Good not good with English but her husband came in to help  Akathisia:  Negative  Handed:  Right  AIMS (if indicated):     Assets:  Desire for Improvement Financial Resources/Insurance Housing Intimacy Social Support  Sleep:       Musculoskeletal: Strength & Muscle Tone: within normal limits Gait & Station: normal Patient leans: N/A   Mental Status Per Nursing Assessment::   On Admission:     Current Mental Status by Physician: NA  Loss Factors: NA  Historical Factors: NA  Risk Reduction Factors:   Living with another person, especially a relative, Positive social support and Positive therapeutic relationship  Continued Clinical Symptoms:  Severe Anxiety and/or Agitation  Cognitive Features That Contribute To Risk:  Closed-mindedness    Suicide Risk:  Minimal: No identifiable suicidal ideation.  Patients presenting with no risk factors but with morbid ruminations; may be classified as minimal risk based on the severity of the depressive symptoms  Discharge Diagnoses:   AXIS I:  major depression  recurrent moderate AXIS II:  Deferred AXIS III:   Past Medical History  Diagnosis Date  . Extended spectrum beta lactamase (ESBL) resistance   . Headache(784.0)   . Depression    AXIS IV:  chronic mental illness and headaches AXIS V:  61-70 mild symptoms  Plan Of Care/Follow-up recommendations:  Activity:  resume usual activity Diet:  resume usual diet  Is patient on multiple antipsychotic therapies at discharge:  No   Has Patient had three or more failed trials of antipsychotic monotherapy by history:  No  Recommended Plan for Multiple Antipsychotic Therapies: NA    Harjot Zavadil D 07/16/2014, 12:24 PM

## 2014-07-16 NOTE — BH Assessment (Signed)
Disposition per Dr. Ladona Ridgelaylor and [place] FoundShuvon Rankin, NP pending collateral information from spouse Corliss Blacker([place]) [phone]. Education officer, environmentalWriter left voicemail.

## 2014-07-16 NOTE — ED Notes (Signed)
Patient instructed to inform spouse of discharge.  Verbalized understanding.

## 2014-07-16 NOTE — Consult Note (Signed)
Face to face evaluation and I agree with this note 

## 2014-07-16 NOTE — [facility] Suicide Risk Assessment (Cosign Needed)
Suicide Risk Assessment  Discharge Assessment     Demographic Factors:  Female  Total Time spent with patient: 30 minutes  Psychiatric Specialty Exam:     Blood pressure 123/81, pulse 89, temperature 98.2 F (36.8 C), temperature source Oral, resp. rate 18, SpO2 100.00%, not currently breastfeeding.There is no weight on file to calculate BMI.   General Appearance: Casual   Eye Contact:: Good   Speech: Clear and Coherent and Normal Rate   Volume: Normal   Mood: Depressed   Affect: Congruent   Thought Process: Circumstantial and Goal Directed   Orientation: Full (Time, Place, and Person)   Thought Content: WDL   Suicidal Thoughts: No   Homicidal Thoughts: No   Memory: Immediate; Good  Recent; Good  Remote; Good   Judgement: Intact   Insight: Fair   Psychomotor Activity: Normal   Concentration: Fair   Recall: Good   Fund of Knowledge:Fair   Language: Poor   Akathisia: No   Handed: Right   AIMS (if indicated):   Assets: Communication Skills  Desire for Improvement  Housing  Social Support   Sleep:   Musculoskeletal:  Strength & Muscle Tone: within normal limits  Gait & Station: normal  Patient leans: Right   Mental Status Per Nursing Assessment::   On Admission:     Current Mental Status by Physician: Patient denies suicidal/homicidal ideation, psychosis, and paranoia  Loss Factors: NA  Historical Factors: NA  Risk Reduction Factors:   Responsible for children under 24 years of age, Sense of responsibility to family, Living with another person, especially a relative and Positive social support  Continued Clinical Symptoms:  Previous Psychiatric Diagnoses and Treatments  Cognitive Features That Contribute To Risk:  Limited English    Suicide Risk:  Minimal: No identifiable suicidal ideation.  Patients presenting with no risk factors but with morbid ruminations; may be classified as minimal risk based on the severity of the depressive  symptoms  Discharge Diagnoses:  AXIS I: Headach and Major Depressive Disorder, recurrent, mild  AXIS II: Deferred  AXIS III:  Past Medical History   Diagnosis  Date   .  Extended spectrum beta lactamase (ESBL) resistance    .  Headache(784.0)    .  Depression     AXIS IV: other psychosocial or environmental problems  AXIS V: 61-70 mild symptoms  Plan Of Care/Follow-up recommendations:  Activity:  as tolerated Diet:  as tolerated  Is patient on multiple antipsychotic therapies at discharge:  No   Has Patient had three or more failed trials of antipsychotic monotherapy by history:  No  Recommended Plan for Multiple Antipsychotic Therapies: NA    Houston Zapien, FNP-BC 07/16/2014, 12:46 PM

## 2014-07-16 NOTE — ED Notes (Signed)
Patient reports to writer that she only came to hospital for a headache. Patient denies SI, HI and AVH at this time. Encouragment and support provided and safety maintain.

## 2014-07-16 NOTE — ED Notes (Signed)
Bed: [facility]39 Expected date:  Expected time:  Means of arrival:  Comments: Hold for triage 3 

## 2014-07-16 NOTE — Discharge Instructions (Signed)
Cefalea y arthritis (Headache and Arthritis) La cefalea y la artritis son problemas comunes. Esto ha ocasionado un inters en el posible papel de la artritis como causante de cefaleas. Existen diversas formas principales de artritis. Dos de los tipos ms comunes son:  Artritis reumatoidea.  Osteoartritis. La atritis reumatoidea puede comenzar a Actuarycualquier edad. Es una enfermedad en la cual el cuerpo ataca algunos de sus propios tejidos, porque cree que no le pertenecen. Esto lleva a la destruccin de las reas seas que rodean las articulaciones. Esta enfermedad puede afectar cualquier articulacin del cuerpo. Suele producir Dispensing opticianuna deformidad en la articulacin. Las manos y los dedos ya no parecen derechos, si no que a menudo parecen torcidos hacia un lado. En algunos casos, puede estar involucrada la columna. Ms a menudo la vrtebra del cuello (columna cervical). Las zonas del cuello que se ven afectadas ms comnmente por la artritis reumatoidea son la primera y la segunda vrtebra. Curiosamente, la artritis reumatoidea, aunque a menudo produce deformidades graves, no siempre es dolorosa.  La forma ms comn de artritis es la osteoartritis. Es una forma de artritis de desgaste. No suele producir deformidades de las articulaciones o destruccin del tejido seo. Los ligamentos se debilitan. Pueden calcificarse debido a los intentos del cuerpo para curar el dao. Las articulaciones ms grandes del cuerpo y aquellas que absorben ms Aleen Campitrabajo y estrs son a menudo las ms FedExafectadas. En la regin del cuello la osteoartritis suele involucrar la Siena Collegequinta, sexta y sptima vrtebra. Esto se debe a que los efectos de la postura provocan fatiga en ellos. La osteoartritis es a menudo ms dolorosa que la artritis reumatoidea.  Durante el examen diagnstico para la artritis, a menudo se podr Magazine features editorrealizar una prueba que evale la inflamacin (la velocidad de sedimentacin globular). En la artritis reumatoidea, habitualmente esta  velocidad est aumentada. Tambin podrn realizarse otras pruebas de inflamacin. En pacientes con osteoartritis, las radiografas del cuello o mandbula mostrarn cambios en los "bordes" de la vrtebra. Esto es ocasionado por depsitos de Target Corporationcalcio en los ligamentos. O podrn mostrar un estrechamiento del espacio entre vrtebras, o formacin de espolones seos (por depsitos de calcio). Si es grave, podra causar una obstruccin de los agujeros por donde pasan los nervios desde la mdula espinal hacia el cuello. En la artritis reumatoidea, podra ocurrir una dislocacin de las vrtebras de la parte superior del cuello. La tomografa computada y resonancia magntica en pacientes con osteoartritis podran mostrar bultos en los discos que amortiguan la columna vertebral. En los casos ms graves, podra ocurrir una hernia de disco.  Si estn involucradas la primera, segunda o tercera vrtebra, la artritis podra causar cefaleas, junto a un dolor en el cuello. Esta enfermedad se debe a que los nervios que inervan el cuello cabelludo slo se originan en esta zona de la mdula. El dolor en el cuello en s, slo o junto con cefaleas, puede aparecer en cualquier parte del mismo. Si est involucrada la mandbula, los sntomas son similares a aquellos del sndrome de la articulacin temporomandibular (SAT).  La gravedad progresiva de la artritis reumatoidea puede enlentecerse por una variedad de potentes medicamentos. En la osteoartritis, esta progresin no suele endentecerse con medicamentos. Lo siguiente puede ser til para frenar el avance de esta enfermedad:  Ajustes en el estilo de vida.  Ejercicios.  Reposo.  Prdida de peso. Los medicamentos, como los agentes antiinflamatorios no esteroides (AINEs), son tiles. Pueden reducir Chief Technology Officerel dolor y Scientist, clinical (histocompatibility and immunogenetics)mejorar la reduccin de movimientos que ocurre en las articulaciones afectadas por  la artritis. Segn algunos estudios, el uso de acetaminofeno parece ser [employer] para  Human resources officer de la artritis, [place]. Tambin puede ser til para la artritis los tratamientos fsicos. Entre ellos se incluyen:  Calor.  Masajes.  Ejercicios. Pero la terapia fsica debe ser prescripta por el mdico, como la [place] de los medicamentos para la artritis.  Document Released: 12/31/2008 Document Revised: 12/27/2011 [facility] Patient Information 2015 [place], [place]. This information is not intended to replace advice given to you by your health care provider. Make sure you discuss any questions you have with your health care provider.

## 2014-07-16 NOTE — Consult Note (Signed)
[facility] Face-to-Face Psychiatry Consult   Reason for Consult:  Headache Referring Physician:  EDP  Catherine Gomez is an 23 y.o. female. Total Time spent with patient: 45 minutes  Assessment: AXIS I:  Headach and Major Depressive Disorder, recurrent, mild AXIS II:  Deferred AXIS III:   Past Medical History  Diagnosis Date  . Extended spectrum beta lactamase (ESBL) resistance   . Headache(784.0)   . Depression    AXIS IV:  other psychosocial or environmental problems AXIS V:  61-70 mild symptoms  Plan:  No evidence of imminent risk to self or others at present.   Patient does not meet criteria for psychiatric inpatient admission. Supportive therapy provided about ongoing stressors. Discussed crisis plan, support from social network, calling 911, coming to the Emergency Department, and calling Suicide Hotline.  Subjective:   Catherine Gomez is a 23 y.o. female patient Presents to [facility] with complaints of headache that won't go away and drooling.  Patient states "I have a headache and medicine doesn't help"  Patient denies suicidal/homicidal ideation, psychosis, and paranoia.  Patient states that she doesn't speak much [place].  Patient husband at bedside and states that patient has been having headaches and drooling.  States that patient medications are not helping and that patient has been to [facility] but nothing is helping headaches.  Husband also states that patient complains about loud noises, nausea sometimes but when she rest in a dark quite room she feels better but headache never goes away.  States that he doesn't know what to do.  States that she doesn't feel like she is depressed, and that patient hasn't said anything about hearing or seeing things and hasn't been responding to any thing.  Doesn't feel like patient will hurt or kill herself or children; and he has seen no paranoia.    Reviewed patient notes and Dr. Richmond Campbell has assessed Headache: migraine vs  psychosomatic Patient has had MRI with and without contrast and no abnormal finds were found.    Patient is taking medications that may cause hypersalivation and headaches.  Patient has also missed her last appointment with [facility] because husband can't miss any more days out of work.    Will arrange for outpatient services locally.  Will also refer patient to Neurology to assess headaches related to Aura to headaches.  Headaches are minimizes with dark, quite, room and rest.  Patient has been having headaches for a year now.    HPI Elements:   Location:  Headaches. Quality:  depression. Severity:  daily headaches. Timing:  1 year. Review of Systems  Gastrointestinal:       Hypersalivation    Neurological: Positive for headaches (Constant).  Psychiatric/Behavioral: Positive for depression. Negative for suicidal ideas, hallucinations, memory loss and substance abuse. The patient is nervous/anxious. The patient does not have insomnia.   All other systems reviewed and are negative.  Family History  Problem Relation Age of Onset  . Alcohol abuse Neg Hx   . Arthritis Neg Hx   . Asthma Neg Hx   . Birth defects Neg Hx   . Cancer Neg Hx   . COPD Neg Hx   . Depression Neg Hx   . Diabetes Neg Hx   . Drug abuse Neg Hx   . Early death Neg Hx   . Hearing loss Neg Hx   . Heart disease Neg Hx   . Hyperlipidemia Neg Hx   . Hypertension Neg Hx   . Kidney disease Neg Hx   .  Learning disabilities Neg Hx   . Mental illness Neg Hx   . Mental retardation Neg Hx   . Miscarriages / Stillbirths Neg Hx   . Stroke Neg Hx   . Vision loss Neg Hx     Past Psychiatric History: Past Medical History  Diagnosis Date  . Extended spectrum beta lactamase (ESBL) resistance   . Headache(784.0)   . Depression     reports that she has never smoked. She has never used smokeless tobacco. She reports that she does not drink alcohol or use illicit drugs. Family History  Problem Relation Age of Onset  .  Alcohol abuse Neg Hx   . Arthritis Neg Hx   . Asthma Neg Hx   . Birth defects Neg Hx   . Cancer Neg Hx   . COPD Neg Hx   . Depression Neg Hx   . Diabetes Neg Hx   . Drug abuse Neg Hx   . Early death Neg Hx   . Hearing loss Neg Hx   . Heart disease Neg Hx   . Hyperlipidemia Neg Hx   . Hypertension Neg Hx   . Kidney disease Neg Hx   . Learning disabilities Neg Hx   . Mental illness Neg Hx   . Mental retardation Neg Hx   . Miscarriages / Stillbirths Neg Hx   . Stroke Neg Hx   . Vision loss Neg Hx            Allergies:  No Known Allergies  ACT Assessment Complete:  Yes:    Educational Status    Risk to Self: Risk to self with the past 6 months Is patient at risk for suicide?: No, but patient needs Medical Clearance Substance abuse history and/or treatment for substance abuse?: No  Risk to Others:    Abuse:    Prior Inpatient Therapy:    Prior Outpatient Therapy:    Additional Information:                    Objective: Blood pressure 123/81, pulse 89, temperature 98.2 F (36.8 C), temperature source Oral, resp. rate 18, SpO2 100.00%, not currently breastfeeding.There is no weight on file to calculate BMI. Results for orders placed during the hospital encounter of 07/15/14 (from the past 72 hour(s))  LITHIUM LEVEL     Status: Abnormal   Collection Time    07/15/14  8:04 PM      Result Value Ref Range   Lithium Lvl <0.25 (*) 0.80 - 1.40 mEq/L  ACETAMINOPHEN LEVEL     Status: None   Collection Time    07/15/14  8:05 PM      Result Value Ref Range   Acetaminophen (Tylenol), Serum <15.0  10 - 30 ug/mL   Comment:            THERAPEUTIC CONCENTRATIONS VARY     SIGNIFICANTLY. A RANGE OF 10-30     ug/mL MAY BE AN EFFECTIVE     CONCENTRATION FOR MANY PATIENTS.     HOWEVER, SOME ARE BEST TREATED     AT CONCENTRATIONS OUTSIDE THIS     RANGE.     ACETAMINOPHEN CONCENTRATIONS     >150 ug/mL AT 4 HOURS AFTER     INGESTION AND >50 ug/mL AT 12     HOURS AFTER  INGESTION ARE     OFTEN ASSOCIATED WITH TOXIC     REACTIONS.  CBC     Status: Abnormal   Collection Time  07/15/14  8:05 PM      Result Value Ref Range   WBC 8.6  4.0 - 10.5 K/uL   RBC 3.40 (*) 3.87 - 5.11 MIL/uL   Hemoglobin 10.3 (*) 12.0 - 15.0 g/dL   HCT 31.0 (*) 36.0 - 46.0 %   MCV 91.2  78.0 - 100.0 fL   MCH 30.3  26.0 - 34.0 pg   MCHC 33.2  30.0 - 36.0 g/dL   RDW 15.2  11.5 - 15.5 %   Platelets 194  150 - 400 K/uL  COMPREHENSIVE METABOLIC PANEL     Status: Abnormal   Collection Time    07/15/14  8:05 PM      Result Value Ref Range   Sodium 140  137 - 147 mEq/L   Potassium 3.7  3.7 - 5.3 mEq/L   Chloride 105  96 - 112 mEq/L   CO2 21  19 - 32 mEq/L   Glucose, Bld 109 (*) 70 - 99 mg/dL   BUN 14  6 - 23 mg/dL   Creatinine, Ser 0.65  0.50 - 1.10 mg/dL   Calcium 9.4  8.4 - 10.5 mg/dL   Total Protein 8.2  6.0 - 8.3 g/dL   Albumin 4.3  3.5 - 5.2 g/dL   AST 14  0 - 37 U/L   ALT 14  0 - 35 U/L   Alkaline Phosphatase 64  39 - 117 U/L   Total Bilirubin <0.2 (*) 0.3 - 1.2 mg/dL   GFR calc non Af Amer >90  >90 mL/min   GFR calc Af Amer >90  >90 mL/min   Comment: (NOTE)     The eGFR has been calculated using the CKD EPI equation.     This calculation has not been validated in all clinical situations.     eGFR's persistently <90 mL/min signify possible Chronic Kidney     Disease.   Anion gap 14  5 - 15  ETHANOL     Status: None   Collection Time    07/15/14  8:05 PM      Result Value Ref Range   Alcohol, Ethyl (B) <11  0 - 11 mg/dL   Comment:            LOWEST DETECTABLE LIMIT FOR     SERUM ALCOHOL IS 11 mg/dL     FOR MEDICAL PURPOSES ONLY  SALICYLATE LEVEL     Status: Abnormal   Collection Time    07/15/14  8:05 PM      Result Value Ref Range   Salicylate Lvl <1.4 (*) 2.8 - 20.0 mg/dL  URINE RAPID DRUG SCREEN (HOSP PERFORMED)     Status: None   Collection Time    07/15/14 10:48 PM      Result Value Ref Range   Opiates NONE DETECTED  NONE DETECTED   Cocaine NONE  DETECTED  NONE DETECTED   Benzodiazepines NONE DETECTED  NONE DETECTED   Amphetamines NONE DETECTED  NONE DETECTED   Tetrahydrocannabinol NONE DETECTED  NONE DETECTED   Barbiturates NONE DETECTED  NONE DETECTED   Comment:            DRUG SCREEN FOR MEDICAL PURPOSES     ONLY.  IF CONFIRMATION IS NEEDED     FOR ANY PURPOSE, NOTIFY LAB     WITHIN 5 DAYS.                LOWEST DETECTABLE LIMITS     FOR URINE DRUG SCREEN  Drug Class       Cutoff (ng/mL)     Amphetamine      1000     Barbiturate      200     Benzodiazepine   592     Tricyclics       924     Opiates          300     Cocaine          300     THC              50  POC URINE PREG, ED     Status: None   Collection Time    07/15/14 11:58 PM      Result Value Ref Range   Preg Test, Ur NEGATIVE  NEGATIVE   Comment:            THE SENSITIVITY OF THIS     METHODOLOGY IS >24 mIU/mL   Labs are reviewed see above values.  Medications reviewed and no changes made  Current Facility-Administered Medications  Medication Dose Route Frequency Provider Last Rate Last Dose  . acetaminophen (TYLENOL) tablet 650 mg  650 mg Oral Q4H PRN Elwyn Lade, PA-C      . alum & mag hydroxide-simeth (MAALOX/MYLANTA) 200-200-20 MG/5ML suspension 30 mL  30 mL Oral PRN Elwyn Lade, PA-C      . ibuprofen (ADVIL,MOTRIN) tablet 600 mg  600 mg Oral Q8H PRN Elwyn Lade, PA-C      . LORazepam (ATIVAN) tablet 1 mg  1 mg Oral Q8H PRN Elwyn Lade, PA-C      . nicotine (NICODERM CQ - dosed in mg/24 hours) patch 21 mg  21 mg Transdermal Daily Hannah S Merrell, PA-C      . ondansetron [facility]) tablet 4 mg  4 mg Oral Q8H PRN Elwyn Lade, PA-C      . zolpidem (AMBIEN) tablet 5 mg  5 mg Oral QHS PRN Elwyn Lade, PA-C       Current Outpatient Prescriptions  Medication Sig Dispense Refill  . etonogestrel (IMPLANON) 68 MG IMPL implant Inject 1 each into the skin once.      Marland Kitchen FLUoxetine (PROZAC) 40 MG capsule Take 40 mg by mouth daily.       Marland Kitchen lithium carbonate (LITHOBID) 300 MG CR tablet Take 600 mg by mouth at bedtime.      Marland Kitchen lithium carbonate 150 MG capsule Take 150 mg by mouth at bedtime.      . Prenatal Vit-Fe Fumarate-FA (PRENATAL MULTIVITAMIN) TABS tablet Take 1 tablet by mouth daily at 12 noon.      . risperiDONE (RISPERDAL) 2 MG tablet Take 2 mg by mouth at bedtime.      . sulindac (CLINORIL) 150 MG tablet Take 150 mg by mouth 2 (two) times daily.        Psychiatric Specialty Exam:     Blood pressure 123/81, pulse 89, temperature 98.2 F (36.8 C), temperature source Oral, resp. rate 18, SpO2 100.00%, not currently breastfeeding.There is no weight on file to calculate BMI.  General Appearance: Casual  Eye Contact::  Good  Speech:  Clear and Coherent and Normal Rate  Volume:  Normal  Mood:  Depressed  Affect:  Congruent  Thought Process:  Circumstantial and Goal Directed  Orientation:  Full (Time, Place, and Person)  Thought Content:  WDL  Suicidal Thoughts:  No  Homicidal Thoughts:  No  Memory:  Immediate;  Good Recent;   Good Remote;   Good  Judgement:  Intact  Insight:  Fair  Psychomotor Activity:  Normal  Concentration:  Fair  Recall:  Good  Fund of Knowledge:Fair  Language: Poor  Akathisia:  No  Handed:  Right  AIMS (if indicated):     Assets:  Communication Skills Desire for Improvement Housing Social Support  Sleep:      Musculoskeletal: Strength & Muscle Tone: within normal limits Gait & Station: normal Patient leans: Right  Treatment Plan Summary: Discharge home with outpatient services.  Give resource information for Neurologist consult/follow up  Earleen Newport, FNP-BC 07/16/2014 12:23 PM

## 2014-07-17 NOTE — ED Provider Notes (Signed)
Medical screening examination/treatment/procedure(s) were performed by non-physician practitioner and as supervising physician I was immediately available for consultation/collaboration.   EKG Interpretation None        Anjuli Gemmill, MD 07/17/14 1139 

## 2014-08-19 ENCOUNTER — Encounter (HOSPITAL_COMMUNITY): Payer: Self-pay | Admitting: Registered Nurse

## 2014-08-31 ENCOUNTER — Emergency Department (HOSPITAL_COMMUNITY)
Admission: EM | Admit: 2014-08-31 | Discharge: 2014-08-31 | Disposition: A | Discharge status: Home / Self Care | Payer: Medicaid Other | Attending: Emergency Medicine | Admitting: Emergency Medicine

## 2014-08-31 ENCOUNTER — Encounter (HOSPITAL_COMMUNITY): Payer: Self-pay | Admitting: Emergency Medicine

## 2014-08-31 DIAGNOSIS — R0789 Other chest pain: Secondary | ICD-10-CM | POA: Insufficient documentation

## 2014-08-31 DIAGNOSIS — R112 Nausea with vomiting, unspecified: Secondary | ICD-10-CM

## 2014-08-31 DIAGNOSIS — Z791 Long term (current) use of non-steroidal anti-inflammatories (NSAID): Secondary | ICD-10-CM | POA: Insufficient documentation

## 2014-08-31 DIAGNOSIS — Z79899 Other long term (current) drug therapy: Secondary | ICD-10-CM | POA: Diagnosis not present

## 2014-08-31 DIAGNOSIS — R197 Diarrhea, unspecified: Secondary | ICD-10-CM | POA: Diagnosis not present

## 2014-08-31 DIAGNOSIS — Z3202 Encounter for pregnancy test, result negative: Secondary | ICD-10-CM | POA: Insufficient documentation

## 2014-08-31 DIAGNOSIS — R109 Unspecified abdominal pain: Secondary | ICD-10-CM | POA: Diagnosis not present

## 2014-08-31 DIAGNOSIS — F329 Major depressive disorder, single episode, unspecified: Secondary | ICD-10-CM | POA: Insufficient documentation

## 2014-08-31 DIAGNOSIS — R21 Rash and other nonspecific skin eruption: Secondary | ICD-10-CM | POA: Diagnosis present

## 2014-08-31 DIAGNOSIS — L700 Acne vulgaris: Secondary | ICD-10-CM | POA: Insufficient documentation

## 2014-08-31 DIAGNOSIS — R509 Fever, unspecified: Secondary | ICD-10-CM | POA: Diagnosis not present

## 2014-08-31 LAB — URINALYSIS, ROUTINE W REFLEX MICROSCOPIC
BILIRUBIN URINE: NEGATIVE
Glucose, UA: NEGATIVE mg/dL
Ketones, ur: NEGATIVE mg/dL
Nitrite: NEGATIVE
PH: 5 (ref 5.0–8.0)
Protein, ur: NEGATIVE mg/dL
SPECIFIC GRAVITY, URINE: 1.026 (ref 1.005–1.030)
Urobilinogen, UA: 0.2 mg/dL (ref 0.0–1.0)

## 2014-08-31 LAB — CBC WITH DIFFERENTIAL/PLATELET
BASOS PCT: 0 % (ref 0–1)
Basophils Absolute: 0 10*3/uL (ref 0.0–0.1)
Eosinophils Absolute: 0.3 10*3/uL (ref 0.0–0.7)
Eosinophils Relative: 3 % (ref 0–5)
HCT: 38.4 % (ref 36.0–46.0)
HEMOGLOBIN: 11.6 g/dL — AB (ref 12.0–15.0)
LYMPHS PCT: 36 % (ref 12–46)
Lymphs Abs: 2.8 10*3/uL (ref 0.7–4.0)
MCH: 26.1 pg (ref 26.0–34.0)
MCHC: 30.2 g/dL (ref 30.0–36.0)
MCV: 86.3 fL (ref 78.0–100.0)
MONOS PCT: 5 % (ref 3–12)
Monocytes Absolute: 0.4 10*3/uL (ref 0.1–1.0)
NEUTROS ABS: 4.3 10*3/uL (ref 1.7–7.7)
NEUTROS PCT: 56 % (ref 43–77)
Platelets: 181 10*3/uL (ref 150–400)
RBC: 4.45 MIL/uL (ref 3.87–5.11)
RDW: 12.3 % (ref 11.5–15.5)
WBC: 7.7 10*3/uL (ref 4.0–10.5)

## 2014-08-31 LAB — COMPREHENSIVE METABOLIC PANEL
ALBUMIN: 4.5 g/dL (ref 3.5–5.2)
ALT: 16 U/L (ref 0–35)
ANION GAP: 14 (ref 5–15)
AST: 17 U/L (ref 0–37)
Alkaline Phosphatase: 79 U/L (ref 39–117)
BUN: 12 mg/dL (ref 6–23)
CHLORIDE: 105 meq/L (ref 96–112)
CO2: 23 mEq/L (ref 19–32)
Calcium: 9.6 mg/dL (ref 8.4–10.5)
Creatinine, Ser: 0.61 mg/dL (ref 0.50–1.10)
GFR calc Af Amer: >90 mL/min (ref >90)
GFR calc non Af Amer: >90 mL/min (ref >90)
Glucose, Bld: 87 mg/dL (ref 70–99)
POTASSIUM: 4.4 meq/L (ref 3.7–5.3)
Sodium: 142 mEq/L (ref 137–147)
Total Protein: 8.9 g/dL — ABNORMAL HIGH (ref 6.0–8.3)

## 2014-08-31 LAB — LIPASE, BLOOD: Lipase: 26 U/L (ref 11–59)

## 2014-08-31 LAB — URINE MICROSCOPIC-ADD ON

## 2014-08-31 LAB — PREGNANCY, URINE: Preg Test, Ur: NEGATIVE

## 2014-08-31 MED ORDER — CEPHALEXIN 500 MG PO CAPS: 500.0000 mg | ORAL_CAPSULE | Freq: Three times a day (TID) | ORAL | Status: DC

## 2014-08-31 MED ORDER — ONDANSETRON HCL 4 MG PO TABS: 4.0000 mg | ORAL_TABLET | Freq: Four times a day (QID) | ORAL | Status: DC

## 2014-08-31 NOTE — ED Provider Notes (Signed)
CSN: [id number]     Arrival date & time 08/31/14  1439 History  This chart was scribed for Eben Burow, PA-C, working with Lyanne Co, MD found by Elon Spanner, ED Scribe. This patient was seen in room WTR7/WTR7 and the patient's care was started at 3:54 PM.   Chief Complaint  Patient presents with  . Rash   The history is provided by the patient. No language interpreter was used.   HPI Comments: Abree Tussey is a 23 y.o. female who presents to the Emergency Department complaining of a a facial rash onset 1 month ago.  She also complains of a daily subjective fever with associated nausea, vomiting, and headache onset two weeks ago.  She also reports a cough and rhinorrhea that she attributes to allergies as well as chest pain, diarrhea, and abdominal pain.  Patient denies SOB, dysuria, hematuria, blood in stool, vaginal bleeding.  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  Patient's husband is translating.  Patient also has a subjective fever.      Past Medical History  Diagnosis Date  . Extended spectrum beta lactamase (ESBL) resistance   . Headache(784.0)   . Depression    Past Surgical History  Procedure Laterality Date  . No past surgeries     Family History  Problem Relation Age of Onset  . Alcohol abuse Neg Hx   . Arthritis Neg Hx   . Asthma Neg Hx   . Birth defects Neg Hx   . Cancer Neg Hx   . COPD Neg Hx   . Depression Neg Hx   . Diabetes Neg Hx   . Drug abuse Neg Hx   . Early death Neg Hx   . Hearing loss Neg Hx   . Heart disease Neg Hx   . Hyperlipidemia Neg Hx   . Hypertension Neg Hx   . Kidney disease Neg Hx   . Learning disabilities Neg Hx   . Mental illness Neg Hx   . Mental retardation Neg Hx   . Miscarriages / Stillbirths Neg Hx   . Stroke Neg Hx   . Vision loss Neg Hx    History  Substance Use Topics  . Smoking  status: Never Smoker   . Smokeless tobacco: Never Used  . Alcohol Use: No   OB History    Gravida Para Term Preterm AB TAB SAB Ectopic Multiple Living   2 2 2  0 0 0 0 0 0 2     Review of Systems  Constitutional: Positive for fever.  Respiratory: Negative for shortness of breath.   Cardiovascular: Positive for chest pain.  Gastrointestinal: Positive for nausea, vomiting, abdominal pain and diarrhea. Negative for blood in stool.  Genitourinary: Negative for dysuria, hematuria, vaginal bleeding and vaginal discharge.  Skin: Positive for rash.  All other systems reviewed and are negative.     Allergies  Other  Home Medications   Prior to Admission medications   Medication Sig Start Date End Date Taking? Authorizing Provider  etonogestrel (IMPLANON) 68 MG IMPL implant Inject 1 each into the skin once.   Yes Historical Provider, MD  cephALEXin (KEFLEX) 500 MG capsule Take 1 capsule (500 mg total) by mouth 3 (three) times daily. 08/31/14   Oskar Cretella A Forcucci, PA-C  FLUoxetine (PROZAC) 40 MG capsule Take 40 mg by mouth daily.    Historical Provider, MD  lithium carbonate (LITHOBID)  300 MG CR tablet Take 600 mg by mouth at bedtime.    Historical Provider, MD  lithium carbonate 150 MG capsule Take 150 mg by mouth at bedtime.    Historical Provider, MD  ondansetron (ZOFRAN) 4 MG tablet Take 1 tablet (4 mg total) by mouth every 6 (six) hours. 08/31/14   Tmya Wigington A Forcucci, PA-C  Prenatal Vit-Fe Fumarate-FA (PRENATAL MULTIVITAMIN) TABS tablet Take 1 tablet by mouth daily at 12 noon.    Historical Provider, MD  risperiDONE (RISPERDAL) 2 MG tablet Take 2 mg by mouth at bedtime.    Historical Provider, MD  sulindac (CLINORIL) 150 MG tablet Take 150 mg by mouth 2 (two) times daily.    Historical Provider, MD   BP 109/59 mmHg  Pulse 84  Temp(Src) 98 F (36.7 C) (Oral)  Resp 18  SpO2 100% Physical Exam  Constitutional: She is oriented to person, place, and time. She appears well-developed  and well-nourished. No distress.  HENT:  Head: Normocephalic and atraumatic.  Mouth/Throat: Oropharynx is clear and moist. No oropharyngeal exudate.  Generalized acneform rash over the cheeks and chin.  There are multiple open and closed comedones.    Eyes: Conjunctivae and EOM are normal. Pupils are equal, round, and reactive to light. No scleral icterus.  Neck: Normal range of motion. Neck supple. No JVD present. No thyromegaly present.  Cardiovascular: Normal rate, regular rhythm, normal heart sounds and intact distal pulses.  Exam reveals no gallop and no friction rub.   No murmur heard. Pulmonary/Chest: Effort normal and breath sounds normal. No respiratory distress. She has no wheezes. She has no rales. She exhibits no tenderness.  Abdominal: Soft. Bowel sounds are normal. She exhibits no distension and no mass. There is no tenderness. There is no rebound and no guarding.  Musculoskeletal: Normal range of motion.  Lymphadenopathy:    She has no cervical adenopathy.  Neurological: She is alert and oriented to person, place, and time. She has normal strength. No cranial nerve deficit or sensory deficit. Coordination normal.  Skin: Skin is warm and dry.  Psychiatric: She has a normal mood and affect. Her behavior is normal. Judgment and thought content normal.  Nursing note and vitals reviewed.   ED Course  Procedures (including critical care time)  DIAGNOSTIC STUDIES: Oxygen Saturation is 100% on RA, normal by my interpretation.    COORDINATION OF CARE:  4:18 PM Will order labs.  Patient acknowledges and agrees with plan.    Labs Review Labs Reviewed  CBC WITH DIFFERENTIAL - Abnormal; Notable for the following:    Hemoglobin 11.6 (*)    All other components within normal limits  COMPREHENSIVE METABOLIC PANEL - Abnormal; Notable for the following:    Total Protein 8.9 (*)    Total Bilirubin <0.2 (*)    All other components within normal limits  URINALYSIS, ROUTINE W REFLEX  MICROSCOPIC - Abnormal; Notable for the following:    Hgb urine dipstick SMALL (*)    Leukocytes, UA SMALL (*)    All other components within normal limits  LIPASE, BLOOD  PREGNANCY, URINE  URINE MICROSCOPIC-ADD ON  POC URINE PREG, ED    Imaging Review No results found.   EKG Interpretation None      MDM   Final diagnoses:  Non-intractable vomiting with nausea, vomiting of unspecified type  Acne vulgaris   Patient is a 23 y.o. Female who presents to the ED with rash, nausea, and vomiting.  Physical exam unremarkable other than an acneform rash.  CBC, CMP, Lipase are unremarkable.  UA shows possible UTI.  Will cover with keflex.  [facility] depression and psychiatric medications.  Patient to be given zofran for nausea.  Will send off Urine for culture.  Patient to return for worsening abdominal pain.  Patient states understanding and agreement.  Patient is stable for discharge.  Patient discussed with Dr. Loretha StaplerWofford who agrees with the above plan and workup.  I personally performed the services described in this documentation, which was scribed in my presence. The recorded information has been reviewed and is accurate.    Eben Burowourtney A Forcucci, PA-C 08/31/14 1840  Warnell Foresterrey Wofford, MD 08/31/14 [phone]

## 2014-08-31 NOTE — ED Notes (Signed)
Pt c/o itchy rash to face and subjective fever x 2 wks.

## 2014-08-31 NOTE — Discharge Instructions (Signed)
Acne Acne is a skin problem that causes pimples. Acne occurs when the pores Shylo your skin get blocked. Your pores may become red, sore, and swollen (inflamed), or infected with a common skin bacterium (Propionibacterium acnes). Acne is a common skin problem. Up to 80% of people get acne at some time. Acne is especially common from the ages of 44 to 50. Acne usually goes away over time with proper treatment. CAUSES  Your pores each contain an oil gland. The oil glands make an oily substance called sebum. Acne happens when these glands get plugged with sebum, dead skin cells, and dirt. The P. acnes bacteria that are normally found Xandria the oil glands then multiply, causing inflammation. Acne is commonly triggered by changes Hollynn your hormones. These hormonal changes can cause the oil glands to get bigger and to make more sebum. Factors that can make acne worse include:  Hormone changes during adolescence.  Hormone changes during women's menstrual cycles.  Hormone changes during pregnancy.  Oil-based cosmetics and hair products.  Harshly scrubbing the skin.  Strong soaps.  Stress.  Hormone problems due to certain diseases.  Long or oily hair rubbing against the skin.  Certain medicines.  Pressure from headbands, backpacks, or shoulder pads.  Exposure to certain oils and chemicals. SYMPTOMS  Acne often occurs on the face, neck, chest, and upper back. Symptoms include:  Small, red bumps (pimples or papules).  Whiteheads (closed comedones).  Blackheads (open comedones).  Small, pus-filled pimples (pustules).  Big, red pimples or pustules that feel tender. More severe acne can cause:  An infected area that contains a collection of pus (abscess).  Hard, painful, fluid-filled sacs (cysts).  Scars. DIAGNOSIS  Your caregiver can usually tell what the problem is by doing a physical exam. TREATMENT  There are many good treatments for acne. Some are available over the counter and some  are available with a prescription. The treatment that is best for you depends on the type of acne you have and how severe it is. It may take 2 months of treatment before your acne gets better. Common treatments include:  Creams and lotions that prevent oil glands from clogging.  Creams and lotions that treat or prevent infections and inflammation.  Antibiotics applied to the skin or taken as a pill.  Pills that decrease sebum production.  Birth control pills.  Light or laser treatments.  Minor surgery.  Injections of medicine into the affected areas.  Chemicals that cause peeling of the skin. HOME CARE INSTRUCTIONS  Good skin care is the most important part of treatment.  Wash your skin gently at least twice a day and after exercise. Always wash your skin before bed.  Use mild soap.  After each wash, apply a water-based skin moisturizer.  Keep your hair clean and off of your face. Shampoo your hair daily.  Only take medicines as directed by your caregiver.  Use a sunscreen or sunblock with SPF 30 or greater. This is especially important when you are using acne medicines.  Choose cosmetics that are noncomedogenic. This means they do not plug the oil glands.  Avoid leaning your chin or forehead on your hands.  Avoid wearing tight headbands or hats.  Avoid picking or squeezing your pimples. This can make your acne worse and cause scarring. SEEK MEDICAL CARE IF:   Your acne is not better after 8 weeks.  Your acne gets worse.  You have a large area of skin that is red or tender. Document Released:  10/01/2000 Document Revised: 02/18/2014 Document Reviewed: 07/23/2011 ExitCare Patient Information 2015 [place]. This information is not intended to replace advice given to you by your health care provider. Make sure you discuss any questions you have with your health care provider.  Nausea and Vomiting Nausea is a sick feeling that often comes before throwing up  (vomiting). Vomiting is a reflex where stomach contents come out of your mouth. Vomiting can cause severe loss of body fluids (dehydration). Children and elderly adults can become dehydrated quickly, especially if they also have diarrhea. Nausea and vomiting are symptoms of a condition or disease. It is important to find the cause of your symptoms. CAUSES   Direct irritation of the stomach lining. This irritation can result from increased acid production (gastroesophageal reflux disease), infection, food poisoning, taking certain medicines (such as nonsteroidal anti-inflammatory drugs), alcohol use, or tobacco use.  Signals from the brain.These signals could be caused by a headache, heat exposure, an inner ear disturbance, increased pressure in the brain from injury, infection, a tumor, or a concussion, pain, emotional stimulus, or metabolic problems.  An obstruction in the gastrointestinal tract (bowel obstruction).  Illnesses such as diabetes, hepatitis, gallbladder problems, appendicitis, kidney problems, cancer, sepsis, atypical symptoms of a heart attack, or eating disorders.  Medical treatments such as chemotherapy and radiation.  Receiving medicine that makes you sleep (general anesthetic) during surgery. DIAGNOSIS Your caregiver may ask for tests to be done if the problems do not improve after a few days. Tests may also be done if symptoms are severe or if the reason for the nausea and vomiting is not clear. Tests may include:  Urine tests.  Blood tests.  Stool tests.  Cultures (to look for evidence of infection).  X-rays or other imaging studies. Test results can help your caregiver make decisions about treatment or the need for additional tests. TREATMENT You need to stay well hydrated. Drink frequently but in small amounts.You may wish to drink water, sports drinks, clear broth, or eat frozen ice pops or gelatin dessert to help stay hydrated.When you eat, eating slowly may  help prevent nausea.There are also some antinausea medicines that may help prevent nausea. HOME CARE INSTRUCTIONS   Take all medicine as directed by your caregiver.  If you do not have an appetite, do not force yourself to eat. However, you must continue to drink fluids.  If you have an appetite, eat a normal diet unless your caregiver tells you differently.  Eat a variety of complex carbohydrates (rice, wheat, potatoes, bread), lean meats, yogurt, fruits, and vegetables.  Avoid high-fat foods because they are more difficult to digest.  Drink enough water and fluids to keep your urine clear or pale yellow.  If you are dehydrated, ask your caregiver for specific rehydration instructions. Signs of dehydration may include:  Severe thirst.  Dry lips and mouth.  Dizziness.  Dark urine.  Decreasing urine frequency and amount.  Confusion.  Rapid breathing or pulse. SEEK IMMEDIATE MEDICAL CARE IF:   You have blood or brown flecks (like coffee grounds) in your vomit.  You have black or bloody stools.  You have a severe headache or stiff neck.  You are confused.  You have severe abdominal pain.  You have chest pain or trouble breathing.  You do not urinate at least once every 8 hours.  You develop cold or clammy skin.  You continue to vomit for longer than 24 to 48 hours.  You have a fever. MAKE SURE YOU:  Understand these instructions.  Will watch your condition.  Will get help right away if you are not doing well or get worse. Document Released: 10/04/2005 Document Revised: 12/27/2011 Document Reviewed: 03/03/2011 [facility] Patient Information 2015 [place], [place]. This information is not intended to replace advice given to you by your health care provider. Make sure you discuss any questions you have with your health care provider.

## 2014-11-30 ENCOUNTER — Emergency Department (HOSPITAL_COMMUNITY)
Admission: EM | Admit: 2014-11-30 | Discharge: 2014-12-01 | Disposition: A | Discharge status: Home / Self Care | Payer: Medicaid Other | Attending: Emergency Medicine | Admitting: Emergency Medicine

## 2014-11-30 DIAGNOSIS — G8929 Other chronic pain: Secondary | ICD-10-CM | POA: Insufficient documentation

## 2014-11-30 DIAGNOSIS — F329 Major depressive disorder, single episode, unspecified: Secondary | ICD-10-CM | POA: Diagnosis not present

## 2014-11-30 DIAGNOSIS — R51 Headache: Secondary | ICD-10-CM | POA: Diagnosis not present

## 2014-11-30 DIAGNOSIS — Z79899 Other long term (current) drug therapy: Secondary | ICD-10-CM | POA: Diagnosis not present

## 2014-11-30 DIAGNOSIS — H53149 Visual discomfort, unspecified: Secondary | ICD-10-CM | POA: Diagnosis not present

## 2014-12-01 ENCOUNTER — Encounter (HOSPITAL_COMMUNITY): Payer: Self-pay | Admitting: *Deleted

## 2014-12-01 MED ORDER — IBUPROFEN 800 MG PO TABS
800.0000 mg | ORAL_TABLET | Freq: Once | ORAL | Status: AC
  Administered 2014-12-01: 800 mg via ORAL
  Filled 2014-12-01: qty 1

## 2014-12-01 MED ORDER — IBUPROFEN 800 MG PO TABS: 800.0000 mg | ORAL_TABLET | Freq: Three times a day (TID) | ORAL | Status: DC

## 2014-12-01 NOTE — ED Notes (Signed)
Pt arrives to the ER with family with complaint of headache for 1 year; pt reports that the pain is worse over the last 2 days and that she has been unable to take care of the children due to pain; husband reports that pt [facility]; husband reports that pt has had 7 CT scans in the last year and no one had been able to find anything; pt states that the pain is generalized all over her head; pt denies SI / HI and denies that it is her depression that it causing the symptoms

## 2014-12-01 NOTE — Discharge Instructions (Signed)

## 2014-12-01 NOTE — ED Provider Notes (Signed)
CSN: [id number]     Arrival date & time 11/30/14  2351 History   First MD Initiated Contact with Patient 12/01/14 0031     Chief Complaint  Patient presents with  . Headache     (Consider location/radiation/quality/duration/timing/severity/associated sxs/prior Treatment) Patient is a 24 y.o. female presenting with headaches. The history is provided by the patient. No language interpreter was used.  Headache Pain location:  Generalized Associated symptoms: photophobia   Associated symptoms: no congestion, no fever, no nausea, no sinus pressure, no vomiting and no weakness   Associated symptoms comment:  The patient has had a daily headache for the past one year. Per her husband, she complains of more pain in the last 2 days. No fever, vomiting, weakness, vomiting, visual changes. She has a history of depression which has also become worse. The patient denies SI/HI, or hallucinations.    Past Medical History  Diagnosis Date  . Extended spectrum beta lactamase (ESBL) resistance   . Headache(784.0)   . Depression    Past Surgical History  Procedure Laterality Date  . No past surgeries     Family History  Problem Relation Age of Onset  . Alcohol abuse Neg Hx   . Arthritis Neg Hx   . Asthma Neg Hx   . Birth defects Neg Hx   . Cancer Neg Hx   . COPD Neg Hx   . Depression Neg Hx   . Diabetes Neg Hx   . Drug abuse Neg Hx   . Early death Neg Hx   . Hearing loss Neg Hx   . Heart disease Neg Hx   . Hyperlipidemia Neg Hx   . Hypertension Neg Hx   . Kidney disease Neg Hx   . Learning disabilities Neg Hx   . Mental illness Neg Hx   . Mental retardation Neg Hx   . Miscarriages / Stillbirths Neg Hx   . Stroke Neg Hx   . Vision loss Neg Hx    History  Substance Use Topics  . Smoking status: Never Smoker   . Smokeless tobacco: Never Used  . Alcohol Use: No   OB History    Gravida Para Term Preterm AB TAB SAB Ectopic Multiple Living   0 0 0 0 0 0 2     Review of  Systems  Constitutional: Negative for fever and chills.  HENT: Negative for congestion and sinus pressure.   Eyes: Positive for photophobia.  Respiratory: Negative.  Negative for shortness of breath.   Cardiovascular: Negative.   Gastrointestinal: Negative for nausea and vomiting.  Musculoskeletal: Negative.   Skin: Negative.   Neurological: Positive for headaches. Negative for weakness and light-headedness.      Allergies  Other  Home Medications   Prior to Admission medications   Medication Sig Start Date End Date Taking? Authorizing Provider  Prenatal Vit-Fe Fumarate-FA (PRENATAL MULTIVITAMIN) TABS tablet Take 1 tablet by mouth daily at 12 noon.   Yes Historical Provider, MD  risperiDONE (RISPERDAL) 2 MG tablet Take 2 mg by mouth at bedtime.   Yes Historical Provider, MD  cephALEXin (KEFLEX) 500 MG capsule Take 1 capsule (500 mg total) by mouth 3 (three) times daily. 08/31/14   Courtney A Forcucci, PA-C  FLUoxetine (PROZAC) 40 MG capsule Take 40 mg by mouth daily.    Historical Provider, MD  lithium carbonate (LITHOBID) 300 MG CR tablet Take 600 mg by mouth at bedtime.    Historical Provider, MD  lithium carbonate 150 MG  capsule Take 150 mg by mouth at bedtime.    Historical Provider, MD  ondansetron (ZOFRAN) 4 MG tablet Take 1 tablet (4 mg total) by mouth every 6 (six) hours. 08/31/14   Courtney A Forcucci, PA-C  sulindac (CLINORIL) 150 MG tablet Take 150 mg by mouth 2 (two) times daily.    Historical Provider, MD   BP 106/74 mmHg  Pulse 85  Temp(Src) 97.5 F (36.4 C) (Oral)  Resp 18  SpO2 100% Physical Exam  Constitutional: She is oriented to person, place, and time. She appears well-developed and well-nourished.  HENT:  Head: Normocephalic.  Eyes: Pupils are equal, round, and reactive to light.  Neck: Normal range of motion. Neck supple.  Cardiovascular: Normal rate and regular rhythm.   Pulmonary/Chest: Effort normal and breath sounds normal.  Abdominal: Soft. Bowel  sounds are normal. There is no tenderness. There is no rebound and no guarding.  Musculoskeletal: Normal range of motion.  Neurological: She is alert and oriented to person, place, and time. She has normal strength and normal reflexes. No sensory deficit. She displays a negative Romberg sign. Coordination normal.  CN's 3-12 grossly intact. NO deficits of coordination. Speech is clear and focused. Ambulatory without imbalance.  Skin: Skin is warm and dry. No rash noted.  Psychiatric: She has a normal mood and affect.    ED Course  Procedures (including critical care time) Labs Review Labs Reviewed - No data to display  Imaging Review No results found.   EKG Interpretation None      MDM   Final diagnoses:  None    1. Headache  The patient's headache is chronic without neurologic deficits today. She is well appearing. Longstanding history of depression as well without suicidal thoughts, hallucinations or desire to harm others today. Will refer to headache clinic and encourage follow up with psychiatry as well for recheck.    Arnoldo HookerShari A Milica Gully, PA-C 12/01/14 0102  Ward GivensIva L Knapp, MD 12/01/14 [phone]

## 2014-12-03 ENCOUNTER — Emergency Department (HOSPITAL_COMMUNITY)
Admission: EM | Admit: 2014-12-03 | Discharge: 2014-12-04 | Disposition: A | Discharge status: Other Acute Inpatient Hospital | Payer: Medicaid Other | Attending: Emergency Medicine | Admitting: Emergency Medicine

## 2014-12-03 ENCOUNTER — Encounter (HOSPITAL_COMMUNITY): Payer: Self-pay | Admitting: *Deleted

## 2014-12-03 DIAGNOSIS — Z79899 Other long term (current) drug therapy: Secondary | ICD-10-CM | POA: Insufficient documentation

## 2014-12-03 DIAGNOSIS — R51 Headache: Secondary | ICD-10-CM | POA: Insufficient documentation

## 2014-12-03 DIAGNOSIS — R110 Nausea: Secondary | ICD-10-CM | POA: Insufficient documentation

## 2014-12-03 DIAGNOSIS — R519 Headache, unspecified: Secondary | ICD-10-CM

## 2014-12-03 DIAGNOSIS — R45851 Suicidal ideations: Secondary | ICD-10-CM | POA: Diagnosis not present

## 2014-12-03 DIAGNOSIS — R45850 Homicidal ideations: Secondary | ICD-10-CM | POA: Diagnosis not present

## 2014-12-03 DIAGNOSIS — Z3202 Encounter for pregnancy test, result negative: Secondary | ICD-10-CM | POA: Diagnosis not present

## 2014-12-03 DIAGNOSIS — F332 Major depressive disorder, recurrent severe without psychotic features: Secondary | ICD-10-CM

## 2014-12-03 LAB — COMPREHENSIVE METABOLIC PANEL
ALT: 15 U/L (ref 0–35)
ANION GAP: 6 (ref 5–15)
AST: 17 U/L (ref 0–37)
Albumin: 4.7 g/dL (ref 3.5–5.2)
Alkaline Phosphatase: 83 U/L (ref 39–117)
BUN: 13 mg/dL (ref 6–23)
CO2: 22 mmol/L (ref 19–32)
CREATININE: 0.65 mg/dL (ref 0.50–1.10)
Calcium: 9.2 mg/dL (ref 8.4–10.5)
Chloride: 111 mmol/L (ref 96–112)
GFR calc non Af Amer: >90 mL/min (ref >90)
Glucose, Bld: 88 mg/dL (ref 70–99)
POTASSIUM: 3.5 mmol/L (ref 3.5–5.1)
Sodium: 139 mmol/L (ref 135–145)
TOTAL PROTEIN: 8.7 g/dL — AB (ref 6.0–8.3)
Total Bilirubin: 0.8 mg/dL (ref 0.3–1.2)

## 2014-12-03 LAB — RAPID URINE DRUG SCREEN, HOSP PERFORMED
Amphetamines: NOT DETECTED
BARBITURATES: NOT DETECTED
BENZODIAZEPINES: NOT DETECTED
Cocaine: NOT DETECTED
Opiates: NOT DETECTED
TETRAHYDROCANNABINOL: NOT DETECTED

## 2014-12-03 LAB — CBC
HCT: 38.3 % (ref 36.0–46.0)
HEMOGLOBIN: 11.9 g/dL — AB (ref 12.0–15.0)
MCH: 26 pg (ref 26.0–34.0)
MCHC: 31.1 g/dL (ref 30.0–36.0)
MCV: 83.8 fL (ref 78.0–100.0)
Platelets: 219 10*3/uL (ref 150–400)
RBC: 4.57 MIL/uL (ref 3.87–5.11)
RDW: 14 % (ref 11.5–15.5)
WBC: 7.9 10*3/uL (ref 4.0–10.5)

## 2014-12-03 LAB — ETHANOL

## 2014-12-03 LAB — PREGNANCY, URINE: Preg Test, Ur: NEGATIVE

## 2014-12-03 LAB — ACETAMINOPHEN LEVEL: Acetaminophen (Tylenol), Serum: <10 ug/mL — ABNORMAL LOW (ref 10–30)

## 2014-12-03 LAB — SALICYLATE LEVEL

## 2014-12-03 MED ORDER — ONDANSETRON HCL 4 MG PO TABS: 4.0000 mg | ORAL_TABLET | Freq: Three times a day (TID) | ORAL | Status: DC | PRN

## 2014-12-03 MED ORDER — ACETAMINOPHEN 325 MG PO TABS
650.0000 mg | ORAL_TABLET | Freq: Once | ORAL | Status: AC
  Administered 2014-12-03: 650 mg via ORAL
  Filled 2014-12-03: qty 2

## 2014-12-03 MED ORDER — IBUPROFEN 200 MG PO TABS
600.0000 mg | ORAL_TABLET | Freq: Three times a day (TID) | ORAL | Status: DC | PRN
  Administered 2014-12-04: 600 mg via ORAL
  Filled 2014-12-03: qty 3

## 2014-12-03 MED ORDER — ONDANSETRON HCL 4 MG PO TABS
4.0000 mg | ORAL_TABLET | Freq: Once | ORAL | Status: AC
  Administered 2014-12-03: 4 mg via ORAL
  Filled 2014-12-03: qty 1

## 2014-12-03 MED ORDER — LORAZEPAM 1 MG PO TABS: 1.0000 mg | ORAL_TABLET | Freq: Three times a day (TID) | ORAL | Status: DC | PRN

## 2014-12-03 MED ORDER — ACETAMINOPHEN 325 MG PO TABS
650.0000 mg | ORAL_TABLET | ORAL | Status: DC | PRN
  Administered 2014-12-04: 650 mg via ORAL
  Filled 2014-12-03: qty 2

## 2014-12-03 MED ORDER — ZOLPIDEM TARTRATE 5 MG PO TABS
5.0000 mg | ORAL_TABLET | Freq: Every evening | ORAL | Status: DC | PRN
Start: 2014-12-03 — End: 2014-12-04
  Administered 2014-12-04: 5 mg via ORAL
  Filled 2014-12-03: qty 1

## 2014-12-03 NOTE — BH Assessment (Addendum)
Tele Assessment Note   Catherine Gomez is an 24 y.o. female who came to the ED with complaints of a headache and increased depression. Pt speaks english as a second language, her first language is [place] which is a dialect from [place]. Pt appeared to have a hard time understanding what the writer was asking and did not speak much which may have been a side effect of her depression instead of a language barrier. Pt was flat and constricted in affect with sullen mood. She did report that she has not been sleeping and that she has not had an appetite. She was accompanied by her ACTT team therapist Lavae Mcclain. Her Psychiatrist is Dr. Dolores Frame. Pt nodded her head no when asked if she was suicidal, homicidal or had any auditory/visual hallucinations today. However it was reported by her husband  that she was threatening to kill herself saying "I don't want to survive". The ACTT team provider reports that this happened on Friday of last week and her husband has been keeping himself up to make sure she does not do anything to hurt herself  because she has not been sleeping. She also reports that pt has been to [facility] for treatment after her 25 year old was born due to severe post partum depression. She reports that the pt  received 9 ECT treatments while she was there. No substance abuse reported from pt or ACTT team provider. No guns noted in the household per pt. Pt does not have many supports in [place] but she does speak to her mom in [place] on the phone frequently.  Writer spoke to husband briefly by phone and he confirmed that pt did state that she had thoughts to harm herself or "not survive" anymore. Husband stated that he brought the pt here a couple of days ago for her increased headache but she states that it is just getting worse. ACTT team stated that these headaches seem to correlate with increased depression. Husband did NOT however endorse that she threatened her daughters and  stated that she only wanted to hurt herself.   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 Extended spectrum beta lactamase (ESBL) resistance   . Headache(784.0)   . Depression    Axis IV: other psychosocial or environmental problems and problems with primary support group Axis V: 11-20 some danger of hurting self or others possible OR occasionally fails to maintain minimal personal hygiene OR gross impairment in communication  Past Medical History:  Past Medical History  Diagnosis Date  . Extended spectrum beta lactamase (ESBL) resistance   . Headache(784.0)   . Depression     Past Surgical History  Procedure Laterality Date  . No past surgeries      Family History:  Family History  Problem Relation Age of Onset  . Alcohol abuse Neg Hx   . Arthritis Neg Hx   . Asthma Neg Hx   . Birth defects Neg Hx   . Cancer Neg Hx   . COPD Neg Hx   . Depression Neg Hx   . Diabetes Neg Hx   . Drug abuse Neg Hx   . Early death Neg Hx   . Hearing loss Neg Hx   . Heart disease Neg Hx   . Hyperlipidemia Neg Hx   . Hypertension Neg Hx   . Kidney disease  Neg Hx   . Learning disabilities Neg Hx   . Mental illness Neg Hx   . Mental retardation Neg Hx   . Miscarriages / Stillbirths Neg Hx   . Stroke Neg Hx   . Vision loss Neg Hx     Social History:  reports that she has never smoked. She has never used smokeless tobacco. She reports that she does not drink alcohol or use illicit drugs.  Additional Social History:  Alcohol / Drug Use History of alcohol / drug use?: No history of alcohol / drug abuse (Denies)  CIWA: CIWA-Ar BP: 115/69 mmHg Pulse Rate: 71 COWS:    PATIENT STRENGTHS: (choose at least two) General fund of knowledge Supportive family/friends  Allergies:  Allergies  Allergen Reactions  . Other Hives and  Itching    Depression med (name unknown)    Home Medications:  (Not in a hospital admission)  OB/GYN Status:  No LMP recorded.  General Assessment Data [facility] of Assessment: [facility] ACT Assessment: Yes Is this a Tele or Face-to-Face Assessment?: Face-to-Face Is this an Initial Assessment or a Re-assessment for this encounter?: Initial Assessment Living Arrangements: Spouse/significant other Can pt return to current living arrangement?: Yes Admission Status: Involuntary Transfer from: Home Referral Source: Self/Family/Friend     [facility] Crisis Care Plan Living Arrangements: Spouse/significant other Name of Psychiatrist:  (Dr. Dolores Frame ) Name of Therapist:  Leanora Ivanoff ACT team)  Education Status Is patient currently in school?: No Highest grade of school patient has completed: Unknown  Risk to self with the past 6 months Suicidal Ideation: No (denies- husband states that she did endorse SI at home) Suicidal Intent: No (denies) Is patient at risk for suicide?: Yes Suicidal Plan?: No Access to Means: No What has been your use of drugs/alcohol within the last 12 months?:  (No substance abuse history) Previous Attempts/Gestures: Yes How many times?:  (unknown) Other Self Harm Risks:  (severe depression ) Triggers for Past Attempts: Unpredictable Intentional Self Injurious Behavior: None Family Suicide History: Unknown Recent stressful life event(s): Other (Comment) (Moved from [place] 4 years ago) Persecutory voices/beliefs?: No Depression: Yes Depression Symptoms: Despondent, Fatigue, Feeling worthless/self pity Substance abuse history and/or treatment for substance abuse?: No Suicide prevention information given to non-admitted patients: Not applicable  Risk to Others within the past 6 months Homicidal Ideation: No Thoughts of Harm to Others: No (Unsure if she was threatening her children, pt denies) Current Homicidal Intent: No Current Homicidal Plan:  No Access to Homicidal Means: No Identified Victim:  (Pt denies) History of harm to others?: No Assessment of Violence: None Noted Violent Behavior Description: none Does patient have access to weapons?: No Criminal Charges Pending?: No Does patient have a court date: No  Psychosis Hallucinations: None noted Delusions: None noted  Mental Status Report Appear/Hygiene: Disheveled, In hospital gown Eye Contact: Poor Motor Activity: Freedom of movement Speech: Soft, Slow Level of Consciousness: Alert Mood: Depressed, Sad, Sullen Affect: Depressed, Constricted, Flat Anxiety Level: None Thought Processes: Thought Blocking Judgement: Impaired Orientation: Person Obsessive Compulsive Thoughts/Behaviors: Unable to Assess  Cognitive Functioning Concentration: Decreased Memory: Unable to Assess IQ: Average Insight: Poor Impulse Control: Unable to Assess Appetite: Poor Weight Loss: 0 Weight Gain: 0 Sleep: Decreased Total Hours of Sleep:  (2) Vegetative Symptoms: Staying in bed, Decreased grooming  ADLScreening [facility] Assessment Services) Patient's cognitive ability adequate to safely complete daily activities?: Yes Patient able to express need for assistance with ADLs?: Yes (however, speaks english as second language) Independently performs  ADLs?: Yes (appropriate for developmental age)  Prior Inpatient Therapy Prior Inpatient Therapy: Yes Prior Therapy Dates:  (2014) [facility] Reason for Treatment: Depression  Prior Outpatient Therapy Prior Outpatient Therapy: Yes Prior Therapy Dates: Friday Prior Therapy Facilty/Provider(s): ACTT team Reason for Treatment: Depression  ADL Screening (condition at time of admission) Patient's cognitive ability adequate to safely complete daily activities?: Yes Is the patient deaf or have difficulty hearing?: No Does the patient have difficulty seeing, even when wearing glasses/contacts?: No Does the  patient have difficulty concentrating, remembering, or making decisions?: Yes Patient able to express need for assistance with ADLs?: Yes (however, speaks english as second language) Does the patient have difficulty dressing or bathing?: No Independently performs ADLs?: Yes (appropriate for developmental age) Does the patient have difficulty walking or climbing stairs?: No Weakness of Legs: None Weakness of Arms/Hands: None  Home Assistive Devices/Equipment Home Assistive Devices/Equipment: None  Therapy Consults (therapy consults require a physician order) PT Evaluation Needed: No OT Evalulation Needed: No SLP Evaluation Needed: No Abuse/Neglect Assessment (Assessment to be complete while patient is alone) Physical Abuse:  (UTA- provider concerned) Verbal Abuse:  (UTA- provider concerned) Sexual Abuse:  (UTA provider concerned) Exploitation of patient/patient's resources:  Industrial/product designer(UTA- provider concerned) Self-Neglect:  (UTA- provider concerned) Values / Beliefs Cultural Requests During Hospitalization: Other (comment) (From [place]- does not speak english well may require some cultural requests)   Advance Directives (For Healthcare) Does patient have an advance directive?: No Would patient like information on creating an advanced directive?: No - patient declined information    Additional Information 1:1 In Past 12 Months?: No CIRT Risk: No Elopement Risk: No Does patient have medical clearance?: Yes     Disposition:  Disposition Initial Assessment Completed for this Encounter: Yes Disposition of Patient: Inpatient treatment program Type of inpatient treatment program: Adult  Valora Norell 12/03/2014 4:45 PM

## 2014-12-03 NOTE — ED Provider Notes (Signed)
CSN: [id number]     Arrival date & time 12/03/14  1313 History  This chart was scribed for non-physician practitioner, Oswaldo Conroy, PA-C working with Elwin Mocha, MD by Luisa Dago, ED scribe. This patient was seen in room [facility] and the patient's care was started at 2:31 PM.    Chief Complaint  Patient presents with  . Suicidal   The history is provided by the patient and medical records (ACT team counselor). No language interpreter was used.   HPI Comments: Catherine Gomez is a 24 y.o. female with PMhx of headaches and depression who was brought to the Emergency Department by ACT team for suicidal ideations. She is currently complaining of a generalized headache. Headache is like other headaches she has had before with gradual onset. No slurred speech visual changes or weakness. Pt also endorses associated nausea. She denies any SI, HI, visual hallucinations, auditory hallucinations. As per act team counselor who is in the room with her, she states that the pt has been complaining of persistent headaches. However, when they change her medication her husband comes back and says that the medicine is not working. She reports changing her medication 15 times now. Last night, she could not sleep and threatened to harm herself and her two children, so her husband brought her in. Counselor states that pt has never voiced intentions of harming herself or others.   Past Medical History  Diagnosis Date  . Extended spectrum beta lactamase (ESBL) resistance   . Headache(784.0)   . Depression    Past Surgical History  Procedure Laterality Date  . No past surgeries     Family History  Problem Relation Age of Onset  . Alcohol abuse Neg Hx   . Arthritis Neg Hx   . Asthma Neg Hx   . Birth defects Neg Hx   . Cancer Neg Hx   . COPD Neg Hx   . Depression Neg Hx   . Diabetes Neg Hx   . Drug abuse Neg Hx   . Early death Neg Hx   . Hearing loss Neg Hx   . Heart disease Neg Hx   .  Hyperlipidemia Neg Hx   . Hypertension Neg Hx   . Kidney disease Neg Hx   . Learning disabilities Neg Hx   . Mental illness Neg Hx   . Mental retardation Neg Hx   . Miscarriages / Stillbirths Neg Hx   . Stroke Neg Hx   . Vision loss Neg Hx    History  Substance Use Topics  . Smoking status: Never Smoker   . Smokeless tobacco: Never Used  . Alcohol Use: No   OB History    Gravida Para Term Preterm AB TAB SAB Ectopic Multiple Living   0 0 0 0 0 0 2     Review of Systems  Constitutional: Negative for fever, chills and diaphoresis.  HENT: Negative for congestion and trouble swallowing.   Eyes: Negative for visual disturbance.  Respiratory: Negative for cough and shortness of breath.   Cardiovascular: Negative for chest pain.  Gastrointestinal: Positive for nausea. Negative for vomiting and abdominal pain.  Genitourinary: Negative for difficulty urinating.  Musculoskeletal: Negative for myalgias and arthralgias.  Skin: Negative for rash.  Neurological: Positive for headaches.  Psychiatric/Behavioral: Positive for suicidal ideas and sleep disturbance.   Allergies  Other  Home Medications   Prior to Admission medications   Medication Sig Start Date End Date Taking? Authorizing Provider  buPROPion (WELLBUTRIN XL)  150 MG 24 hr tablet Take 150 mg by mouth daily.   Yes Historical Provider, MD  risperiDONE (RISPERDAL) 1 MG tablet Take 1 mg by mouth at bedtime.   Yes Historical Provider, MD  cephALEXin (KEFLEX) 500 MG capsule Take 1 capsule (500 mg total) by mouth 3 (three) times daily. Patient not taking: Reported on 12/03/2014 08/31/14   Courtney A Forcucci, PA-C  ibuprofen (ADVIL,MOTRIN) 800 MG tablet Take 1 tablet (800 mg total) by mouth 3 (three) times daily. Patient not taking: Reported on 12/03/2014 12/01/14   Melvenia BeamShari A Upstill, PA-C  ondansetron (ZOFRAN) 4 MG tablet Take 1 tablet (4 mg total) by mouth every 6 (six) hours. Patient not taking: Reported on 12/03/2014 08/31/14    Courtney A Forcucci, PA-C   BP 115/69 mmHg  Pulse 71  Temp(Src) 98.3 F (36.8 C) (Oral)  Resp 16  SpO2 100%  Physical Exam  Constitutional: She appears well-developed and well-nourished. No distress.  HENT:  Head: Normocephalic and atraumatic.  Mouth/Throat: Oropharynx is clear and moist.  Eyes: Conjunctivae and EOM are normal. Pupils are equal, round, and reactive to light. Right eye exhibits no discharge. Left eye exhibits no discharge.  Neck: Normal range of motion. Neck supple.  No nuchal rigidity  Cardiovascular: Normal rate and regular rhythm.   Pulmonary/Chest: Effort normal and breath sounds normal. No respiratory distress. She has no wheezes.  Abdominal: Soft. Bowel sounds are normal. She exhibits no distension. There is no tenderness.  Neurological: She is alert. No cranial nerve deficit. Coordination normal.  Speech is clear and goal oriented. Peripheral visual fields intact. Strength 5/5 in upper and lower extremities. Sensation intact. Intact rapid alternating movements, finger to nose, and heel to shin. Negative Romberg. No pronator drift. Normal gait.   Skin: Skin is warm and dry. She is not diaphoretic.  Nursing note and vitals reviewed.   ED Course  Procedures (including critical care time)  DIAGNOSTIC STUDIES: Oxygen Saturation is 100% on RA, normal by my interpretation.    COORDINATION OF CARE: 2:40 PM- Pt and ACT team counselor advised of plan for treatment and they agree.  Labs Review Labs Reviewed  ACETAMINOPHEN LEVEL - Abnormal; Notable for the following:    Acetaminophen (Tylenol), Serum <10.0 (*)    All other components within normal limits  CBC - Abnormal; Notable for the following:    Hemoglobin 11.9 (*)    All other components within normal limits  COMPREHENSIVE METABOLIC PANEL - Abnormal; Notable for the following:    Total Protein 8.7 (*)    All other components within normal limits  ETHANOL  SALICYLATE LEVEL  URINE RAPID DRUG SCREEN  (HOSP PERFORMED)  POC URINE PREG, ED    Imaging Review No results found.   EKG Interpretation None      MDM   Final diagnoses:  Acute nonintractable headache, unspecified headache type  Suicidal ideation  Homicidal ideation   Patient presenting with her ACT team for concerns of positive suicidal ideation as well as homicidal ideation to family members including her 2 small children. Per act team she has headaches when she is depressed. Patient without red flags for headache. Neurological exam without deficits. Patient given Tylenol and zofran. Pt with normal head CT 06/2014. Lab work WNL except for mild anemia. Patient medically cleared for psychological evaluation.   I personally performed the services described in this documentation, which was scribed in my presence. The recorded information has been reviewed and is accurate.   Louann SjogrenVictoria L Malaka Ruffner, PA-C  12/03/14 1824  Elwin Mocha, MD 12/04/14 [id number]

## 2014-12-03 NOTE — ED Notes (Signed)
Patient comes to nurses station and ask when is she leaving. Writer explain to patient that they are looking for placement and when one has been located we will inform her. Encouragement and support provided and safety maintain.

## 2014-12-03 NOTE — Progress Notes (Signed)
  CSW faxed patient referral to the following inpatient facilities that have/will have bed: [facility], [facility], [facility], [facility], [facility], [facility], [facility], and [facility].  Melbourne Abtsatia Alcee Sipos, LCSWA Disposition staff 12/03/2014 8:05 PM

## 2014-12-03 NOTE — ED Notes (Addendum)
Pt here with her ACT team, [facility] Rhunette Croft(Larae McLean [phone]). ACT team has been working with pt for several months, [facility] [place] [facility] who thinks headaches are somatic and that pt refers to headaches when she is depressed. Husband reported last night pt was saying she wanted to harm herself and everyone else including her two young children. When asked if pt is SI pt says "I don't know" when pressed further pt states "yes" but will not give plan. Pt reports headache 10/10.   rn spoke with nurse from ACT team. [facility] doctor stated pt was doing better when he saw her in December, compared to in August. ACT team has changed her medications, now pt takes risperdone 1 mg HS, and wellbutrin. Pt was seen last Friday and was doing well.  Husband told ACT that it was after therapist left on Friday that pt stated she wanted to kill herself and everyone else. Pt never voiced SI ever before this in past 5 months. Pt has arranged marriage, husband reports he is at his end, is not sleeping because he is now watching with wife and children. Only major change is that husband was laid off 3 weeks ago and now has a different job where he is in the home more until 3pm. ACT RN unsure if this change is what is causing the decompensation. English is second language, but pt understand English and will answer in laymens terms stating "her depression is high today".  ACT states that pt wears the headdress all the time, even at home. Pt reports it is cultural and agrees to take off headdress when she goes to a room.

## 2014-12-04 ENCOUNTER — Inpatient Hospital Stay (HOSPITAL_COMMUNITY)
Admission: AD | Admit: 2014-12-04 | Discharge: 2014-12-16 | DRG: 885 | Disposition: A | Discharge status: Home / Self Care | Payer: Medicaid Other | Source: Intra-hospital | Attending: Psychiatry | Admitting: Psychiatry

## 2014-12-04 ENCOUNTER — Encounter (HOSPITAL_COMMUNITY): Payer: Self-pay | Admitting: General Practice

## 2014-12-04 DIAGNOSIS — F322 Major depressive disorder, single episode, severe without psychotic features: Secondary | ICD-10-CM | POA: Diagnosis present

## 2014-12-04 DIAGNOSIS — R45850 Homicidal ideations: Secondary | ICD-10-CM | POA: Insufficient documentation

## 2014-12-04 DIAGNOSIS — R45851 Suicidal ideations: Secondary | ICD-10-CM | POA: Diagnosis present

## 2014-12-04 DIAGNOSIS — F332 Major depressive disorder, recurrent severe without psychotic features: Secondary | ICD-10-CM | POA: Diagnosis present

## 2014-12-04 DIAGNOSIS — R51 Headache: Secondary | ICD-10-CM | POA: Diagnosis present

## 2014-12-04 DIAGNOSIS — Z79899 Other long term (current) drug therapy: Secondary | ICD-10-CM | POA: Diagnosis not present

## 2014-12-04 DIAGNOSIS — R519 Headache, unspecified: Secondary | ICD-10-CM

## 2014-12-04 DIAGNOSIS — R4182 Altered mental status, unspecified: Secondary | ICD-10-CM | POA: Diagnosis not present

## 2014-12-04 DIAGNOSIS — G43909 Migraine, unspecified, not intractable, without status migrainosus: Secondary | ICD-10-CM

## 2014-12-04 DIAGNOSIS — R410 Disorientation, unspecified: Secondary | ICD-10-CM | POA: Diagnosis not present

## 2014-12-04 MED ORDER — IBUPROFEN 600 MG PO TABS: 600.0000 mg | ORAL_TABLET | Freq: Three times a day (TID) | ORAL | Status: DC | PRN

## 2014-12-04 MED ORDER — IBUPROFEN 600 MG PO TABS
600.0000 mg | ORAL_TABLET | Freq: Three times a day (TID) | ORAL | Status: DC | PRN
  Administered 2014-12-04 – 2014-12-05 (×3): 600 mg via ORAL
  Filled 2014-12-04 (×3): qty 1

## 2014-12-04 MED ORDER — LORAZEPAM 1 MG PO TABS
1.0000 mg | ORAL_TABLET | Freq: Three times a day (TID) | ORAL | Status: DC | PRN
  Administered 2014-12-05 – 2014-12-12 (×8): 1 mg via ORAL
  Filled 2014-12-04 (×10): qty 1

## 2014-12-04 MED ORDER — ACETAMINOPHEN 325 MG PO TABS
650.0000 mg | ORAL_TABLET | Freq: Four times a day (QID) | ORAL | Status: DC | PRN
  Administered 2014-12-05: 650 mg via ORAL
  Filled 2014-12-04: qty 2

## 2014-12-04 MED ORDER — MAGNESIUM HYDROXIDE 400 MG/5ML PO SUSP: 30.0000 mL | Freq: Every day | ORAL | Status: DC | PRN

## 2014-12-04 MED ORDER — BUPROPION HCL ER (SR) 150 MG PO TB12
150.0000 mg | ORAL_TABLET | Freq: Every day | ORAL | Status: DC
  Administered 2014-12-04 – 2014-12-09 (×6): 150 mg via ORAL
  Filled 2014-12-04 (×9): qty 1

## 2014-12-04 MED ORDER — RISPERIDONE 1 MG PO TABS
1.0000 mg | ORAL_TABLET | Freq: Every day | ORAL | Status: DC
  Administered 2014-12-04: 1 mg via ORAL
  Filled 2014-12-04 (×3): qty 1

## 2014-12-04 MED ORDER — ALUM & MAG HYDROXIDE-SIMETH 200-200-20 MG/5ML PO SUSP: 30.0000 mL | ORAL | Status: DC | PRN

## 2014-12-04 MED ORDER — ZOLPIDEM TARTRATE 5 MG PO TABS: 5.0000 mg | ORAL_TABLET | Freq: Every evening | ORAL | Status: DC | PRN

## 2014-12-04 MED ORDER — ONDANSETRON HCL 4 MG PO TABS
4.0000 mg | ORAL_TABLET | Freq: Three times a day (TID) | ORAL | Status: DC | PRN
  Administered 2014-12-08: 4 mg via ORAL
  Filled 2014-12-04: qty 1

## 2014-12-04 NOTE — Consult Note (Signed)
[facility] Consult   Reason for Consult:  Suicidal ideations Referring Physician:  EDP Patient Identification: Catherine DuckingRahinatou Gomez MRN:  [id number] Principal Diagnosis: Suicidal ideation Diagnosis:   Patient Active Problem List   Diagnosis Date Noted  . 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 Suicidal ideation [R45.851] 12/04/2014    Priority: High  . Headache [R51] 07/16/2014  . Hypersalivation [K11.7] 07/16/2014  . Psychosis [F29] 04/08/2014  . Postpartum depression [F53] 04/07/2014  . Severe preeclampsia [O14.10] 12/18/2011  . Normal delivery [O80, Z37.9] 12/18/2011  . Urinary tract infection during pregnancy [O23.40] 05/24/2011    Total Time spent with patient: 45 minutes  Subjective:   Catherine Gomez is a 24 y.o. female patient admitted with depression and suicidal threats.  HPI:  The patient was brought to the ED by her husband after threatening to kill herself and her two children last night.  Maricella endorses "high" level of depression and has been having issues sleeping, especially the past four nights.  She has been having frequent headaches that increases her depression but states Tylenol relieves it.  Her depression started after the birth of her child and has been fluctuating since then.   HPI Elements:   Location:  generalized. Quality:  acute. Severity:  severe. Timing:  constant. Duration:  few days. Context:  stressors.  Past Medical History:  Past Medical History  Diagnosis Date  . Extended spectrum beta lactamase (ESBL) resistance   . Headache(784.0)   . Depression     Past Surgical History  Procedure Laterality Date  . No past surgeries     Family History:  Family History  Problem Relation Age of Onset  . Alcohol abuse Neg Hx   . Arthritis Neg Hx   . Asthma Neg Hx   . Birth defects Neg Hx   . Cancer Neg Hx   . COPD Neg Hx   . Depression Neg Hx   . Diabetes Neg Hx   . Drug  abuse Neg Hx   . Early death Neg Hx   . Hearing loss Neg Hx   . Heart disease Neg Hx   . Hyperlipidemia Neg Hx   . Hypertension Neg Hx   . Kidney disease Neg Hx   . Learning disabilities Neg Hx   . Mental illness Neg Hx   . Mental retardation Neg Hx   . Miscarriages / Stillbirths Neg Hx   . Stroke Neg Hx   . Vision loss Neg Hx    Social History:  History  Alcohol Use No     History  Drug Use No    History   Social History  . Marital Status: Married    Spouse Name: N/A  . Number of Children: N/A  . Years of Education: N/A   Social History Main Topics  . Smoking status: Never Smoker   . Smokeless tobacco: Never Used  . Alcohol Use: No  . Drug Use: No  . Sexual Activity: Yes   Other Topics Concern  . None   Social History Narrative   Additional Social History:    History of alcohol / drug use?: No history of alcohol / drug abuse (Denies)                     Allergies:   Allergies  Allergen Reactions  . Other Hives and Itching    Depression med (name unknown)    Vitals: Blood pressure 100/58, pulse 72,  temperature 98.6 F (37 C), temperature source Oral, resp. rate 18, SpO2 96 %, not currently breastfeeding.  Risk to Self: Suicidal Ideation: No (denies- husband states that she did endorse SI at home) Suicidal Intent: No (denies) Is patient at risk for suicide?: Yes Suicidal Plan?: No Access to Means: No What has been your use of drugs/alcohol within the last 12 months?:  (No substance abuse history) How many times?:  (unknown) Other Self Harm Risks:  (severe depression ) Triggers for Past Attempts: Unpredictable Intentional Self Injurious Behavior: None Risk to Others: Homicidal Ideation: No Thoughts of Harm to Others: No (Unsure if she was threatening her children, pt denies) Current Homicidal Intent: No Current Homicidal Plan: No Access to Homicidal Means: No Identified Victim:  (Pt denies) History of harm to others?: No Assessment of  Violence: None Noted Violent Behavior Description: none Does patient have access to weapons?: No Criminal Charges Pending?: No Does patient have a court date: No Prior Inpatient Therapy: Prior Inpatient Therapy: Yes Prior Therapy Dates:  (2014) Prior Therapy Facilty/Provider(s): [facility] Reason for Treatment: Depression Prior Outpatient Therapy: Prior Outpatient Therapy: Yes Prior Therapy Dates: Friday Prior Therapy Facilty/Provider(s): [facility] Reason for Treatment: Depression  Current Facility-Administered Medications  Medication Dose Route Frequency Provider Last Rate Last Dose  . acetaminophen (TYLENOL) tablet 650 mg  650 mg Oral Q4H PRN Louann Sjogren, PA-C   650 mg at 12/04/14 0052  . ibuprofen (ADVIL,MOTRIN) tablet 600 mg  600 mg Oral Q8H PRN Louann Sjogren, PA-C   600 mg at 12/04/14 4540  . LORazepam (ATIVAN) tablet 1 mg  1 mg Oral Q8H PRN Louann Sjogren, PA-C      . ondansetron [facility]) tablet 4 mg  4 mg Oral Q8H PRN Louann Sjogren, PA-C      . zolpidem (AMBIEN) tablet 5 mg  5 mg Oral QHS PRN Louann Sjogren, PA-C   5 mg at 12/04/14 9811   Current Outpatient Prescriptions  Medication Sig Dispense Refill  . buPROPion (WELLBUTRIN XL) 150 MG 24 hr tablet Take 150 mg by mouth daily.    . risperiDONE (RISPERDAL) 1 MG tablet Take 1 mg by mouth at bedtime.    . cephALEXin (KEFLEX) 500 MG capsule Take 1 capsule (500 mg total) by mouth 3 (three) times daily. (Patient not taking: Reported on 12/03/2014) 21 capsule 0  . ibuprofen (ADVIL,MOTRIN) 800 MG tablet Take 1 tablet (800 mg total) by mouth 3 (three) times daily. (Patient not taking: Reported on 12/03/2014) 21 tablet 0  . ondansetron (ZOFRAN) 4 MG tablet Take 1 tablet (4 mg total) by mouth every 6 (six) hours. (Patient not taking: Reported on 12/03/2014) 30 tablet 0    Musculoskeletal: Strength & Muscle Tone: within normal limits Gait & Station: normal Patient leans: N/A  Psychiatric Specialty Exam:     Blood  pressure 100/58, pulse 72, temperature 98.6 F (37 C), temperature source Oral, resp. rate 18, SpO2 96 %, not currently breastfeeding.There is no weight on file to calculate BMI.  General Appearance: Casual  Eye Contact::  Fair  Speech:  Normal Rate  Volume:  Normal  Mood:  Depressed  Affect:  Congruent  Thought Process:  Coherent  Orientation:  Full (Time, Place, and Person)  Thought Content:  WDL  Suicidal Thoughts:  Yes.  with intent/plan  Homicidal Thoughts:  No  Memory:  Immediate;   Fair Recent;   Fair Remote;   Fair  Judgement:  Fair  Insight:  Fair  Psychomotor Activity:  Decreased  Concentration:  Fair  Recall:  [employer] of Knowledge:Fair  Language: Fair  Akathisia:  No  Handed:  Right  AIMS (if indicated):     Assets:  Housing Leisure Time Physical Health Resilience Social Support  ADL's:  Intact  Cognition: WNL  Sleep:      Medical Decision Making: Review of Psycho-Social Stressors (1), Review or order clinical lab tests (1) and Review of Medication Regimen & Side Effects (2)  Treatment Plan Summary: Daily contact with patient to assess and evaluate symptoms and progress in treatment, Medication management and Plan admit to [facility] for stabilization  Plan:  Recommend psychiatric Inpatient admission when medically cleared. Disposition: admit to [facility] for stabilization  Nanine Means, PMH-NP 12/04/2014 10:55 AM Patient seen face-to-face for psychiatric evaluation, chart reviewed and case discussed with the physician extender and developed treatment plan. Reviewed the information documented and agree with the treatment plan. Thedore Mins, MD

## 2014-12-04 NOTE — Tx Team (Signed)
Initial Interdisciplinary Treatment Plan   PATIENT STRESSORS: Depression   PATIENT STRENGTHS: General fund of knowledge   PROBLEM LIST: Problem List/Patient Goals Date to be addressed Date deferred Reason deferred Estimated date of resolution  Depression 12/04/2014           "I want my depression better" 12/04/2014           Pt. Did not report any other goal to [facility]                               DISCHARGE CRITERIA:  Improved stabilization in mood, thinking, and/or behavior Verbal commitment to aftercare and medication compliance  PRELIMINARY DISCHARGE PLAN: Attend aftercare/continuing care group Attend PHP/IOP  PATIENT/FAMIILY INVOLVEMENT: This treatment plan has been presented to and reviewed with the patient, Catherine Gomez.  The patient and family have been given the opportunity to ask questions and make suggestions.  Marzetta BoardDopson, Catherine Gomez 12/04/2014, 3:34 PM

## 2014-12-04 NOTE — Progress Notes (Signed)
Patient ID: Catherine Gomez, female   DOB: 06/26/1991, 24 y.o.   MRN: [id number]  Speaking with patient and review of chart occurred prior to this entry. Brixton B. Is a 24 year old [facility] making suicidal statements to her husband. It is unclear but patient may have made a statement about harming her children as well. Patient currently denies SI/HI and A/V hallucinations. Patient however refers to her depression as "she" and points to her head.Patient reports she wants help with her depression because it is high. Patient reports "she" makes her pull out some of her hair. On right side of head patient has a small patch of hair missing. Skin search is otherwise unremarkable. Patient reports her native language is [place] and she comes from [place]. Patient is able to speak some English but this may be why she refers to things as "she." [facility] and there has had ECT treatment. Patient reports peristant headaches that are chronic. See H&P for full pertinent medical history. Patient verbalized understanding of the admission process and was oriented to the unit. Q15 minute safety checks were initiated and are maintained.

## 2014-12-04 NOTE — [facility] Group Notes (Signed)
Adult Psychoeducational Group Note  Date:  12/04/2014 Time:  9:52 PM  Group Topic/Focus:  Wrap-Up Group:   The focus of this group is to help patients review their daily goal of treatment and discuss progress on daily workbooks.  Participation Level:  Minimal  Participation Quality:  Attentive  Affect:  Appropriate  Cognitive:  Alert  Insight: Limited  Engagement in Group:  Limited  Modes of Intervention:  Discussion  Additional Comments:  Rahinato said she wasn't feeling well and she was feeling dizzy.  Caroll RancherLindsay, Adith Tejada A 12/04/2014, 9:52 PM

## 2014-12-04 NOTE — ED Notes (Signed)
Pt left her cellphone (contraband) in bathroom. Writer had it added to inventory sheet and it was placed in her locker

## 2014-12-04 NOTE — ED Notes (Signed)
Patient complains of headache and rates pain 6/10 using numeric pain scale. Patient will be medicated per MAR. Encouragement and support provided and safety maintain.

## 2014-12-04 NOTE — Progress Notes (Signed)
Patient ID: Catherine Gomez, female   DOB: 10/24/1990, 24 y.o.   MRN: [id number] D: Patient alert and cooperative. Pt c/o headache referring it as "she hurts me". Pt rated pain as 8 on a 0-10 scale. Pt reports previous admission as [facility]. Pt reports her medication is not working and need it evaluated. Pt is observed sitting in dayroom watching TV and not interacting with peers.   A: Medications administered as prescribed. Emotional support given and will continue to monitor pt's progress for stabilization.  R: Patient remains safe and complaint with medications. Pt denies SI/HI/AVH. Pt attended evening wrap up group.

## 2014-12-04 NOTE — BH Assessment (Signed)
[facility] Assessment Progress Note  Per Thedore MinsMojeed Akintayo, MD this pt requires psychiatric hospitalization at this time.  Nanine MeansJamison Lord, NP reports that Berneice Heinrichina Tate, RN, [facility] pt to Rm 407-1.  Pt has signed Voluntary Admission and Consent for Treatment, as well as Consent to Release Information, and both forms [facility].  Pt's nurse, Minerva Areolaric, has been notified.  He agrees to send original paperwork along with pt via [facility] [place], and to call report to [phone].  Doylene Canninghomas Wen Merced, MA Triage Specialist 12/04/2014 @ 11:24

## 2014-12-05 DIAGNOSIS — F332 Major depressive disorder, recurrent severe without psychotic features: Secondary | ICD-10-CM

## 2014-12-05 DIAGNOSIS — R440 Auditory hallucinations: Secondary | ICD-10-CM

## 2014-12-05 MED ORDER — CITALOPRAM HYDROBROMIDE 10 MG PO TABS
10.0000 mg | ORAL_TABLET | Freq: Every day | ORAL | Status: DC
  Administered 2014-12-06: 10 mg via ORAL
  Filled 2014-12-05 (×4): qty 1

## 2014-12-05 MED ORDER — IBUPROFEN 800 MG PO TABS
800.0000 mg | ORAL_TABLET | Freq: Three times a day (TID) | ORAL | Status: DC | PRN
  Administered 2014-12-06 (×2): 800 mg via ORAL
  Filled 2014-12-05 (×2): qty 1

## 2014-12-05 MED ORDER — CITALOPRAM HYDROBROMIDE 20 MG PO TABS
20.0000 mg | ORAL_TABLET | Freq: Every day | ORAL | Status: DC
  Administered 2014-12-05: 20 mg via ORAL
  Filled 2014-12-05 (×3): qty 1

## 2014-12-05 MED ORDER — RISPERIDONE 2 MG PO TABS
2.0000 mg | ORAL_TABLET | Freq: Every day | ORAL | Status: DC
  Administered 2014-12-05 – 2014-12-08 (×4): 2 mg via ORAL
  Filled 2014-12-05 (×7): qty 1

## 2014-12-05 MED ORDER — BUTALBITAL-APAP-CAFFEINE 50-325-40 MG PO TABS
1.0000 | ORAL_TABLET | Freq: Four times a day (QID) | ORAL | Status: DC | PRN
  Administered 2014-12-05: 1 via ORAL
  Filled 2014-12-05: qty 1

## 2014-12-05 NOTE — Plan of Care (Signed)
Problem: Ineffective individual coping Goal: STG: Patient will remain free from self harm Outcome: Progressing Pt is safe and free of self harm.     

## 2014-12-05 NOTE — Progress Notes (Signed)
The patient refused to attend this evening's Karaoke group despite being encouraged by this author. This author asked the patient's interpreter to invite her to the group, but the patient still refused.

## 2014-12-05 NOTE — [facility] Group Notes (Signed)
The focus of this group is to educate the patient on the purpose and policies of crisis stabilization and provide a format to answer questions about their admission.  The group details unit policies and expectations of patients while admitted. Patient attended group and established a goal for the day. 

## 2014-12-05 NOTE — H&P (Signed)
Psychiatric Admission Assessment Adult  Patient Identification: Catherine Gomez MRN:  [id number] Date of Evaluation:  12/05/2014 Chief Complaint:  MAJOR DEPRESSIVE DISORDER,RECURRENT,SEVERE Principal Diagnosis: <principal problem not specified> Diagnosis:   Patient Active Problem List   Diagnosis Date Noted  . 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 Suicidal ideation [R45.851] 12/04/2014  . Severe major depression without psychotic features [F32.2] 12/04/2014  . Homicidal ideation [R45.850]   . Headache [R51] 07/16/2014  . Hypersalivation [K11.7] 07/16/2014  . Psychosis [F29] 04/08/2014  . Postpartum depression [F53] 04/07/2014  . Severe preeclampsia [O14.10] 12/18/2011  . Normal delivery [O80, Z37.9] 12/18/2011  . Urinary tract infection during pregnancy [O23.40] 05/24/2011   History of Present Illness: Catherine Gomez is a 24 year old patient who came in with psychosis.  Per ED report states that she wanted to hurt herself and her children.   She was seen today.  An interpreter was used to interview patient today. She states she is happily married.  She came to the [place]. from [place] and she speaks little [place].  Patient has been seen in the [facility] for psychosis, speaking/uttering sounds.  She is also known to the ED for severe headache presentation.  She states that she "gets this way when she experiences severe and dizzying headaches".  They are intermittent and comes on suddenly.  She denies any form of depression.  She endorses hearing voices especially in her sleep.    Elements:  Location:  psychosis. Quality:  Feel hopeless, anxiety. Severity:  severe. Timing:  yesterday. Duration:  chronic intermittent. Context:  "I get dizziness, severe headaches". Associated Signs/Symptoms: Depression Symptoms:  depressed mood, fatigue, difficulty concentrating, hopelessness, (Hypo) Manic Symptoms:  Hallucinations, Irritable Mood, Labiality of  Mood, Anxiety Symptoms:  Excessive Worry, Psychotic Symptoms:  Hallucinations: Auditory PTSD Symptoms: NA Total Time spent with patient: 30 minutes  Past Medical History:  Past Medical History  Diagnosis Date  . Extended spectrum beta lactamase (ESBL) resistance   . Headache(784.0)   . Depression     Past Surgical History  Procedure Laterality Date  . No past surgeries     Family History:  Family History  Problem Relation Age of Onset  . Alcohol abuse Neg Hx   . Arthritis Neg Hx   . Asthma Neg Hx   . Birth defects Neg Hx   . Cancer Neg Hx   . COPD Neg Hx   . Depression Neg Hx   . Diabetes Neg Hx   . Drug abuse Neg Hx   . Early death Neg Hx   . Hearing loss Neg Hx   . Heart disease Neg Hx   . Hyperlipidemia Neg Hx   . Hypertension Neg Hx   . Kidney disease Neg Hx   . Learning disabilities Neg Hx   . Mental illness Neg Hx   . Mental retardation Neg Hx   . Miscarriages / Stillbirths Neg Hx   . Stroke Neg Hx   . Vision loss Neg Hx    Social History:  History  Alcohol Use No     History  Drug Use No    History   Social History  . Marital Status: Married    Spouse Name: N/A  . Number of Children: N/A  . Years of Education: N/A   Social History Main Topics  . Smoking status: Never Smoker   . Smokeless tobacco: Never Used  . Alcohol Use: No  . Drug Use: No  . Sexual Activity: Yes  Other Topics Concern  . None   Social History Narrative   Additional Social History:                          Musculoskeletal: Strength & Muscle Tone: within normal limits Gait & Station: normal Patient leans: N/A  Psychiatric Specialty Exam: Physical Exam  Vitals reviewed.   Review of Systems  Constitutional: Negative.   HENT: Negative.   Eyes: Negative.   Respiratory: Negative.   Cardiovascular: Negative.   Gastrointestinal: Negative.   Genitourinary: Negative.   Musculoskeletal: Negative.   Skin: Negative.   Neurological: Negative.    Endo/Heme/Allergies: Negative.   Psychiatric/Behavioral: Positive for depression. The patient is nervous/anxious.     Blood pressure 104/69, pulse 88, temperature 97.9 F (36.6 C), temperature source Oral, resp. rate 16, height 5' 4.25" (1.632 m), weight 68.947 kg (152 lb), not currently breastfeeding.Body mass index is 25.89 kg/(m^2).  General Appearance: Fairly Groomed  Engineer, water::  Minimal  Speech:  normal rate, spoke in [place] dialect  Volume:  Normal  Mood:  Depressed  Affect:  Flat  Thought Process:  Coherent  Orientation:  Full (Time, Place, and Person)  Thought Content:  Hallucinations: Auditory and Rumination  Suicidal Thoughts:  No  Homicidal Thoughts:  No  Memory:  Immediate;   Fair Recent;   Fair Remote;   Fair  Judgement:  Fair  Insight:  Lacking  Psychomotor Activity:  Normal  Concentration:  Good  Recall:  [employer] of Knowledge:Fair  Language: Fair  Akathisia:  Negative  Handed:  Right  AIMS (if indicated):     Assets:  Desire for Improvement Financial Resources/Insurance Physical Health Social Support Vocational/Educational  ADL's:  Intact  Cognition: WNL  Sleep:  Number of Hours: 5.75   Risk to Self: Is patient at risk for suicide?: Yes Risk to Others:   Prior Inpatient Therapy:   Prior Outpatient Therapy:    Alcohol Screening: 1. How often do you have a drink containing alcohol?: Never 9. Have you or someone else been injured as a result of your drinking?: No 10. Has a relative or friend or a doctor or another health worker been concerned about your drinking or suggested you cut down?: No Alcohol Use Disorder Identification Test Final Score (AUDIT): 0 Brief Intervention: AUDIT score less than 7 or less-screening does not suggest unhealthy drinking-brief intervention not indicated  Allergies:   Allergies  Allergen Reactions  . Other Hives and Itching    Depression med (name unknown)   Lab Results:  Results for orders placed or performed  during the hospital encounter of 12/03/14 (from the past 48 hour(s))  Pregnancy, urine     Status: None   Collection Time: 12/03/14 10:00 PM  Result Value Ref Range   Preg Test, Ur NEGATIVE NEGATIVE    Comment:        THE SENSITIVITY OF THIS METHODOLOGY IS >20 mIU/mL.    Current Medications: Current Facility-Administered Medications  Medication Dose Route Frequency Provider Last Rate Last Dose  . acetaminophen (TYLENOL) tablet 650 mg  650 mg Oral Q6H PRN Waylan Boga, NP   650 mg at 12/05/14 1325  . alum & mag hydroxide-simeth (MAALOX/MYLANTA) 200-200-20 MG/5ML suspension 30 mL  30 mL Oral Q4H PRN Waylan Boga, NP      . buPROPion [facility] SR) 12 hr tablet 150 mg  150 mg Oral Daily Waylan Boga, NP   150 mg at 12/05/14 0732  . ibuprofen (  ADVIL,MOTRIN) tablet 600 mg  600 mg Oral Q8H PRN Shuvon Rankin, NP   600 mg at 12/05/14 1609  . LORazepam (ATIVAN) tablet 1 mg  1 mg Oral Q8H PRN Waylan Boga, NP      . magnesium hydroxide (MILK OF MAGNESIA) suspension 30 mL  30 mL Oral Daily PRN Waylan Boga, NP      . ondansetron (ZOFRAN) tablet 4 mg  4 mg Oral Q8H PRN Waylan Boga, NP      . risperiDONE (RISPERDAL) tablet 1 mg  1 mg Oral QHS Waylan Boga, NP   1 mg at 12/04/14 2140  . zolpidem (AMBIEN) tablet 5 mg  5 mg Oral QHS PRN Waylan Boga, NP       PTA Medications: Prescriptions prior to admission  Medication Sig Dispense Refill Last Dose  . buPROPion (WELLBUTRIN XL) 150 MG 24 hr tablet Take 150 mg by mouth daily.   12/02/2014 at Unknown time  . ondansetron (ZOFRAN) 4 MG tablet Take 1 tablet (4 mg total) by mouth every 6 (six) hours. (Patient not taking: Reported on 12/03/2014) 30 tablet 0 Completed Course at Unknown time  . risperiDONE (RISPERDAL) 1 MG tablet Take 1 mg by mouth at bedtime.   12/02/2014 at Unknown time    Previous Psychotropic Medications: No   Substance Abuse History in the last 12 months:  No.    Consequences of Substance Abuse: NA  Results for orders placed or  performed during the hospital encounter of 12/03/14 (from the past 72 hour(s))  Acetaminophen level     Status: Abnormal   Collection Time: 12/03/14  2:21 PM  Result Value Ref Range   Acetaminophen (Tylenol), Serum <10.0 (L) 10 - 30 ug/mL    Comment:        THERAPEUTIC CONCENTRATIONS VARY SIGNIFICANTLY. A RANGE OF 10-30 ug/mL MAY BE AN EFFECTIVE CONCENTRATION FOR MANY PATIENTS. HOWEVER, SOME ARE BEST TREATED AT CONCENTRATIONS OUTSIDE THIS RANGE. ACETAMINOPHEN CONCENTRATIONS >150 ug/mL AT 4 HOURS AFTER INGESTION AND >50 ug/mL AT 12 HOURS AFTER INGESTION ARE OFTEN ASSOCIATED WITH TOXIC REACTIONS.   Ethanol (ETOH)     Status: None   Collection Time: 12/03/14  2:21 PM  Result Value Ref Range   Alcohol, Ethyl (B) <5 0 - 9 mg/dL    Comment:        LOWEST DETECTABLE LIMIT FOR SERUM ALCOHOL IS 11 mg/dL FOR MEDICAL PURPOSES ONLY   Salicylate level     Status: None   Collection Time: 12/03/14  2:21 PM  Result Value Ref Range   Salicylate Lvl <2.7 2.8 - 20.0 mg/dL  CBC     Status: Abnormal   Collection Time: 12/03/14  2:23 PM  Result Value Ref Range   WBC 7.9 4.0 - 10.5 K/uL   RBC 4.57 3.87 - 5.11 MIL/uL   Hemoglobin 11.9 (L) 12.0 - 15.0 g/dL   HCT 38.3 36.0 - 46.0 %   MCV 83.8 78.0 - 100.0 fL   MCH 26.0 26.0 - 34.0 pg   MCHC 31.1 30.0 - 36.0 g/dL   RDW 14.0 11.5 - 15.5 %   Platelets 219 150 - 400 K/uL  Comprehensive metabolic panel     Status: Abnormal   Collection Time: 12/03/14  2:23 PM  Result Value Ref Range   Sodium 139 135 - 145 mmol/L   Potassium 3.5 3.5 - 5.1 mmol/L   Chloride 111 96 - 112 mmol/L   CO2 22 19 - 32 mmol/L   Glucose, Bld 88 70 -  99 mg/dL   BUN 13 6 - 23 mg/dL   Creatinine, Ser 0.65 0.50 - 1.10 mg/dL   Calcium 9.2 8.4 - 10.5 mg/dL   Total Protein 8.7 (H) 6.0 - 8.3 g/dL   Albumin 4.7 3.5 - 5.2 g/dL   AST 17 0 - 37 U/L   ALT 15 0 - 35 U/L   Alkaline Phosphatase 83 39 - 117 U/L   Total Bilirubin 0.8 0.3 - 1.2 mg/dL   GFR calc non Af Amer >90 >90  mL/min   GFR calc Af Amer >90 >90 mL/min    Comment: (NOTE) The eGFR has been calculated using the CKD EPI equation. This calculation has not been validated in all clinical situations. eGFR's persistently <90 mL/min signify possible Chronic Kidney Disease.    Anion gap 6 5 - 15  Urine Drug Screen     Status: None   Collection Time: 12/03/14  2:29 PM  Result Value Ref Range   Opiates NONE DETECTED NONE DETECTED   Cocaine NONE DETECTED NONE DETECTED   Benzodiazepines NONE DETECTED NONE DETECTED   Amphetamines NONE DETECTED NONE DETECTED   Tetrahydrocannabinol NONE DETECTED NONE DETECTED   Barbiturates NONE DETECTED NONE DETECTED    Comment:        DRUG SCREEN FOR MEDICAL PURPOSES ONLY.  IF CONFIRMATION IS NEEDED FOR ANY PURPOSE, NOTIFY LAB WITHIN 5 DAYS.        LOWEST DETECTABLE LIMITS FOR URINE DRUG SCREEN Drug Class       Cutoff (ng/mL) Amphetamine      1000 Barbiturate      200 Benzodiazepine   161 Tricyclics       096 Opiates          300 Cocaine          300 THC              50   Pregnancy, urine     Status: None   Collection Time: 12/03/14 10:00 PM  Result Value Ref Range   Preg Test, Ur NEGATIVE NEGATIVE    Comment:        THE SENSITIVITY OF THIS METHODOLOGY IS >20 mIU/mL.     Observation Level/Precautions:  15 minute checks  Laboratory:  per ED  Psychotherapy:  Group therapy  Medications:  As per medlist  Consultations:  As needed  Discharge Concerns:  Safety  Estimated LOS:  2-5 days  Other:     Psychological Evaluations: Yes   Treatment Plan Summary: Daily contact with patient to assess and evaluate symptoms and progress in treatment and Medication management  Start Celexa 20 mg QD for depression Start Risperdone 2 mg qHS  Medical Decision Making:  Established Problem, Stable/Improving (1), Review of Psycho-Social Stressors (1), Review or order clinical lab tests (1), Discuss test with performing physician (1), Decision to obtain old records (1),  Review and summation of old records (2), Review of Last Therapy Session (1), Review or order medicine tests (1), Review of Medication Regimen & Side Effects (2) and Review of New Medication or Change in Dosage (2)  I certify that inpatient services furnished can reasonably be expected to improve the patient's condition.   Kerrie Buffalo MAY, AGNP-BC 2/18/20164:31 PM  I have discussed case with NP and have met with patient. Agree with NP Assessment, Plan. Patient is a 24 year old married woman. Speaks little [place] ( she is originally from [place] and was interviewed via Optometrist. She reports depression, auditory hallucinations, and chronic headaches,  which she feels are at least contributing to her depression. At this time has been started on Celexa and Risperidone .

## 2014-12-05 NOTE — Progress Notes (Signed)
Called infection control staff Misty StanleyLisa Dasnoit about the flag for the extended spectrum beta lactamase and she stated " behavioral health is different and if the patient does not have any symptoms, then they do not have to be on contact, if the patient does have symptoms of a urinary tract infection then they could need contact precautions; if the patient was somewhere else then yes she would be on contact precautions". I spoke with her at 0820 hrs. AC Nira Retortina T. RN was notified of the response from infection control at 0822 hrs. Will continue to monitor.

## 2014-12-05 NOTE — Progress Notes (Signed)
D[facility] c/o headache [facility].  Lab work WNL, CT scan obtained in 2015 showed no abnormal findings.  Advised to continue to give Acetaminophen PRN and will monitor for further episodes of what patient describes as "severe headaches".  She has no c/o at the present time.

## 2014-12-05 NOTE — Clinical Social Work Note (Addendum)
CSW has requested [place] interpreter for patient through interpreting department. Per Fritzi MandesKirsten at [employer] [phone]), interpreter should be here tonight or Friday morning.  Samuella BruinKristin Merlon Alcorta, MSW, [employer]CSWA Clinical Social Worker [facility] [phone]

## 2014-12-05 NOTE — [facility] Group Notes (Signed)
[facility] LCSW Group Therapy 12/05/2014  1:15 pm  Type of Therapy: Group Therapy Participation Level: Minimal  Participation Quality: Minimal  Affect: Depressed and Flat  Cognitive: Alert and Oriented  Insight: Developing/Improving and Engaged  Engagement in Therapy: Developing/Improving and Engaged  Modes of Intervention: Clarification, Confrontation, Discussion, Education, Exploration, Limit-setting, Orientation, Problem-solving, Rapport Building, Dance movement psychotherapisteality Testing, Socialization and Support  Summary of Progress/Problems: The topic for group was balance in life. Today's group focused on defining balance in one's own words, identifying things that can knock one off balance, and exploring healthy ways to maintain balance in life. Group members were asked to provide an example of a time when they felt off balance, describe how they handled that situation,and process healthier ways to regain balance in the future. Group members were asked to share the most important tool for maintaining balance that [facility] and how they plan to apply this method after discharge. Patient attended group but was called out shortly after beginning of discussion to speak with MD and did not return.  Samuella BruinKristin Ascher Schroepfer, MSW, [employer]CSWA Clinical Social Worker [facility] [phone]

## 2014-12-05 NOTE — Progress Notes (Addendum)
D: Patient denies SI/HI and visual hallucinations; patient reports that she did not sleep well because she had a headache and that she still had a headache this morning; patient also reported that her energy level was hyper and her appetite was good; patient rates depression 6/10; hopelessness 10/10; anxiety 4/10; patient reports headaches and dizziness with the headaches; Dr. Jama Flavorsobos and NP Dominga FerryAgustin are aware; patient denies auditory hallucinations today but   A: Monitored q 15 minutes; patient encouraged to attend groups; patient educated about medications; patient given medications per physician orders; patient encouraged to express feelings and/or concerns; order to increase ibuprofen  R: Patient is laughing and giggling during group when asked question but answering; patient is guarded and forwards some information; patient is cooperative; patient reports some relief of the headaches and dizziness

## 2014-12-05 NOTE — [facility] Suicide Risk Assessment (Signed)
[facility]   Nursing information obtained from:  Patient Demographic factors:  Adolescent or young adult Current Mental Status:  NA Loss Factors:  NA Historical Factors:  NA Risk Reduction Factors:  Living with another person, especially a relative Total Time spent with patient: 45 minutes Principal Problem: Severe major depression without psychotic features Diagnosis:   Patient Active Problem List   Diagnosis Date Noted  . 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 Suicidal ideation [R45.851] 12/04/2014  . Severe major depression without psychotic features [F32.2] 12/04/2014  . Homicidal ideation [R45.850]   . Headache [R51] 07/16/2014  . Hypersalivation [K11.7] 07/16/2014  . Psychosis [F29] 04/08/2014  . Postpartum depression [F53] 04/07/2014  . Severe preeclampsia [O14.10] 12/18/2011  . Normal delivery [O80, Z37.9] 12/18/2011  . Urinary tract infection during pregnancy [O23.40] 05/24/2011     Continued Clinical Symptoms:  Alcohol Use Disorder Identification Test Final Score (AUDIT): 0 The "Alcohol Use Disorders Identification Test", Guidelines for Use in Primary Care, Second Edition.  World Science writerHealth Organization [facility](WHO). Score between 0-7:  no or low risk or alcohol related problems. Score between 8-15:  moderate risk of alcohol related problems. Score between 16-19:  high risk of alcohol related problems. Score 20 or above:  warrants further diagnostic evaluation for alcohol dependence and treatment.   CLINICAL FACTORS:  Of note, patient speaks little [place], and was interviewed via Administrator, [employer]ausa translator.  Patient is a 24 year old female, originally from [place]. Has been in country for 4 years. She is married and lives with husband and two young children ( currently being taken care of by father) . Presents with depression and auditory hallucinations. Reportedly had a similar episode of decompensation one year ago ( post  partum) when she was psychotic. Does not endorse mania history and does not present with manic or mixed symptoms at this time. No drug or alcohol abuse .   Musculoskeletal: Strength & Muscle Tone: within normal limits Gait & Station: normal Patient leans: N/A  Psychiatric Specialty Exam: Physical Exam  Review of Systems  Constitutional: Negative for fever and chills.  Respiratory: Negative for cough and shortness of breath.   Cardiovascular: Negative for chest pain.  Gastrointestinal: Negative for vomiting.  Genitourinary: Negative for dysuria, urgency and frequency.  Skin: Negative for rash.  Psychiatric/Behavioral: Positive for depression and hallucinations.    Blood pressure 104/69, pulse 88, temperature 97.9 F (36.6 C), temperature source Oral, resp. rate 16, height 5' 4.25" (1.632 m), weight 152 lb (68.947 kg), not currently breastfeeding.Body mass index is 25.89 kg/(m^2).  General Appearance: Fairly Groomed  Patent attorneyye Contact::  Minimal  Speech:  Slow  Volume:  Decreased  Mood:  Depressed  Affect:  Constricted  Thought Process:  Linear  Orientation:  Other:  fully alert and attentive, does not appear delirious   Thought Content:  describes auditory hallucinations telling her to do things, but denies any hallucinations telliing her to hurt herself or anyone else . No delusions are expressed   Suicidal Thoughts:  No at this time denies any thoughts of hurting self or anyone else .  Homicidal Thoughts:  No  Memory:   Recent and remote fair   Judgement:  Fair  Insight:  Lacking  Psychomotor Activity:  Decreased  Concentration:  Good  Recall:  Fair  Fund of Knowledge:Good  Language: Fair- does not speak English fluently  Akathisia:  No  Handed:  Right  AIMS (if indicated):     Assets:  Desire for Improvement Physical Health Resilience  Sleep:  Number of Hours: 5.75  Cognition: WNL  ADL's:  Impaired     COGNITIVE FEATURES THAT CONTRIBUTE TO RISK:  Closed-mindedness  and Loss of executive function    SUICIDE RISK:   Moderate:  Frequent suicidal ideation with limited intensity, and duration, some specificity in terms of plans, no associated intent, good self-control, limited dysphoria/symptomatology, some risk factors present, and identifiable protective factors, including available and accessible social support.  PLAN OF CARE: Patient will be admitted to inpatient psychiatric unit for stabilization and safety. Will provide and encourage milieu participation. Provide medication management and maked adjustments as needed.  Will follow daily.    Medical Decision Making:  Review of Psycho-Social Stressors (1), Review or order clinical lab tests (1), Established Problem, Worsening (2) and Review of New Medication or Change in Dosage (2)  I certify that inpatient services furnished can reasonably be expected to improve the patient's condition.   COBOS, Madaline Guthrie 12/05/2014, 5:47 PM

## 2014-12-06 MED ORDER — CITALOPRAM HYDROBROMIDE 20 MG PO TABS
20.0000 mg | ORAL_TABLET | Freq: Every day | ORAL | Status: DC
  Administered 2014-12-07: 20 mg via ORAL
  Filled 2014-12-06 (×2): qty 1

## 2014-12-06 MED ORDER — SUMATRIPTAN SUCCINATE 25 MG PO TABS
25.0000 mg | ORAL_TABLET | Freq: Once | ORAL | Status: AC
  Administered 2014-12-06: 25 mg via ORAL
  Filled 2014-12-06: qty 1

## 2014-12-06 MED ORDER — SUMATRIPTAN SUCCINATE 6 MG/0.5ML ~~LOC~~ SOLN
6.0000 mg | Freq: Once | SUBCUTANEOUS | Status: AC
  Administered 2014-12-06: 6 mg via SUBCUTANEOUS
  Filled 2014-12-06 (×2): qty 0.5

## 2014-12-06 NOTE — Tx Team (Signed)
Interdisciplinary Treatment Plan Update (Adult) Date: 12/06/2014   Time Reviewed: 9:30 AM  Progress in Treatment: Attending groups: Yes Participating in groups: Minimally Taking medication as prescribed: Yes Tolerating medication: Yes Family/Significant other contact made: No, CSW assessing for appropriate contacts Patient understands diagnosis: Yes Discussing patient identified problems/goals with staff: Yes Medical problems stabilized or resolved: Yes Denies suicidal/homicidal ideation: Yes Issues/concerns per patient self-inventory: Yes Other:  New problem(s) identified: N/A  Discharge Plan or Barriers:   2/19: Patient plans to return home to continue services with ACTT team at discharge.  Reason for Continuation of Hospitalization:  Depression Anxiety Medication Stabilization   Comments: N/A  Estimated length of stay: 3-5 days  For review of initial/current patient goals, please see plan of care.  Patient is a 24 year old African female admitted with increasing depression and headaches. [place] and 2 children. She currently receives services with an ACTT team. Patient will benefit from crisis stabilization, medication evaluation, group therapy, and psycho education in addition to case management for discharge planning. Patient and CSW reviewed pt's identified goals and treatment plan. Pt verbalized understanding and agreed to treatment plan.   Attendees:  Patient:    Signature: Sallyanne HaversF. Cobos, MD 12/06/2014 9:30 AM   Signature: Geoffery LyonsIrving Lugo, MD 12/06/2014 9:30 AM   Signature:Chris Sharee PimpleJudge, RN; Shelda JakesPatty Duke, RN 12/06/2014 9:30 AM   Signature: 12/06/2014 9:30 AM   Signature: Herbert SetaHeather Smart, LCSWA 12/06/2014 9:30 AM   Signature: Juline PatchQuylle Hodnett, LCSW 12/06/2014 9:30 AM   Signature: Belenda CruiseKristin Royale Swamy, LCSWA 12/06/2014 9:30 AM   Signature: Leisa LenzValerie Enoch,  Care Coordinator [facility] 12/06/2014 9:30 AM   Signature: Onnie BoerJennifer Clark, CM   Signature:                          Scribe for Treatment Team:  Samuella BruinKristin Zacherie Honeyman, MSW, LCSWA [phone]

## 2014-12-06 NOTE — [facility] Group Notes (Signed)
   [facility]  12/06/2014  8:45 AM   Participation Quality: Alert, Appropriate and Oriented  Mood/Affect: Depressed and Flat  Depression Rating: 6  Anxiety Rating: 4  Thoughts of Suicide: Pt denies SI/HI  Will you contract for safety? Yes  Current AVH: Pt denies  Plan for Discharge/Comments: Pt attended discharge planning group and actively participated in group. CSW provided pt with today's workbook. Patient reports feeling "good but still having headaches". Patient plans to return home to resume services with her ACTT team.  Transportation Means: Pt reports access to transportation  Supports: No supports mentioned at this time  Samuella BruinKristin Samayah Novinger, MSW, [employer]CSWA Clinical Social Worker [facility] [phone]

## 2014-12-06 NOTE — Progress Notes (Signed)
[facility] Group Notes:  (Nursing/MHT/Case Management/Adjunct)  Date:  12/06/2014  Time:  11:57 PM  Type of Therapy:  Psychoeducational Skills  Participation Level:  Active  Participation Quality:  Attentive  Affect:  Flat  Cognitive:  Appropriate  Insight:  Improving  Engagement in Group:  Developing/Improving  Modes of Intervention:  Education  Summary of Progress/Problems: The patient attended this evening'Gomez Wrap-Up group with her interpreter. According to the patient, she did not have a very good day since she dealt with headaches throughout much of the day. The patient also verbalized that she didn't receive relief from her headaches until she received an injection which she states was very painful. The patient'Gomez affect brightened when a few of her peers shared that they were concerned about her since they could hear her crying at times. In terms of the theme for the day, her relapse prevention will involve cooking and watching television.   Catherine Gomez, Catherine Gomez 12/06/2014, 11:57 PM

## 2014-12-06 NOTE — Progress Notes (Signed)
[facility] MD Progress Note  12/06/2014 1:24 PM Catherine Gomez  MRN:  [id number] Subjective:  Patient states she feels "OK". At this time states she is depressed, but not worse than when she was admitted. She emphasizes chronic headache as a major issue. Objective : Patient interviewed via Nurse, learning disability. I have discussed case with treatment team. Patient remains depressed and constricted in affect , but presents improved compared to admission, with less soft speech, improved posture, improved eye contact, and less severely constricted affect. Patient also denies any current auditory hallucinations, and does not seem internally preoccupied . Behavior on unit in good control. Visible in day room. No agitated or disruptive behaviors. At this time patient is denying medication  Side effects . Patient's major complaint is chronic headaches. These are not new, and states they have been going on " for about a year ". She describes headaches as chronic , intermittent, and seems to associate them with some visual issues as well. At times they are associated with nausea.  Describes loss of visual acuity, and states she has been given prescription lenses in the past and does not use them. She does have difficulty identifying how many fingers I am holding up even at 7-8 feet away.  With patient's express consent and at her request , I spoke with her husband  Mr. Clarene Reamer [phone]) . He states headaches have been chronic but seem to be worsening. He states she had some work up done at [facility], but he is unsure if a Head CT or MRI were done. He states he thinks they did an EEG , but is unsure.       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 Suicidal ideation [R45.851] 12/04/2014  . Severe major depression without psychotic features [F32.2]  12/04/2014  . Homicidal ideation [R45.850]   . Headache [R51] 07/16/2014  . Hypersalivation [K11.7] 07/16/2014  . Psychosis [F29] 04/08/2014  . Postpartum depression [F53] 04/07/2014  . Severe preeclampsia [O14.10] 12/18/2011  . Normal delivery [O80, Z37.9] 12/18/2011  . Urinary tract infection during pregnancy [O23.40] 05/24/2011   Total Time spent with patient: 30 minutes   Past Medical History:  Past Medical History  Diagnosis Date  . Extended spectrum beta lactamase (ESBL) resistance   . Headache(784.0)   . Depression     Past Surgical History  Procedure Laterality Date  . No past surgeries     Family History:  Family History  Problem Relation Age of Onset  . Alcohol abuse Neg Hx   . Arthritis Neg Hx   . Asthma Neg Hx   . Birth defects Neg Hx   . Cancer Neg Hx   . COPD Neg Hx   . Depression Neg Hx   . Diabetes Neg Hx   . Drug abuse Neg Hx   . Early death Neg Hx   . Hearing loss Neg Hx   . Heart disease Neg Hx   . Hyperlipidemia Neg Hx   . Hypertension Neg Hx   . Kidney disease Neg Hx   . Learning disabilities Neg Hx   . Mental illness Neg Hx   . Mental retardation Neg Hx   . Miscarriages / Stillbirths Neg Hx   . Stroke Neg Hx   . Vision loss Neg Hx    Social History:  History  Alcohol Use No  History  Drug Use No    History   Social History  . Marital Status: Married    Spouse Name: N/A  . Number of Children: N/A  . Years of Education: N/A   Social History Main Topics  . Smoking status: Never Smoker   . Smokeless tobacco: Never Used  . Alcohol Use: No  . Drug Use: No  . Sexual Activity: Yes   Other Topics Concern  . None   Social History Narrative   Additional History:    Sleep: Fair  Appetite:  Good   Assessment:   Musculoskeletal: Strength & Muscle Tone: within normal limits Gait & Station: normal Patient leans: N/A   Psychiatric Specialty Exam: Physical Exam  Review of Systems  Constitutional: Negative for fever  and chills.  Eyes: Negative.        Loss of visual acuity  Respiratory: Negative for cough and shortness of breath.   Cardiovascular: Negative for chest pain.  Genitourinary: Negative for dysuria.  Skin: Negative for rash.  Neurological: Positive for headaches. Negative for seizures and loss of consciousness.  Psychiatric/Behavioral: Positive for depression and hallucinations.    Blood pressure 96/62, pulse 109, temperature 97.4 F (36.3 C), temperature source Oral, resp. rate 16, height 5' 4.25" (1.632 m), weight 152 lb (68.947 kg), not currently breastfeeding.Body mass index is 25.89 kg/(m^2).  General Appearance: Fairly Groomed  Patent attorney::  improved   Speech:  Slow  Volume:  improved, less soft  Mood:  Depressed  Affect:  less constricted, does smile at times appropriately  Thought Process:  Goal Directed and Linear  Orientation:  Other:  fully alert and attentive  Thought Content:  no hallucinations today, no delusions expressed   Suicidal Thoughts:  No At this time denies any suicidal plan or intent and contracts for safety on the unit  Homicidal Thoughts:  No  Memory:  Recent and remote fair   Judgement:  Fair  Insight:  Fair  Psychomotor Activity:  Normal  Concentration:  Good  Recall:  Good  Fund of Knowledge:Good  Language: Good  Akathisia:  Negative  Handed:  Right  AIMS (if indicated):     Assets:  Desire for Improvement Resilience Social Support  ADL's:  Fair   Cognition:  Alert and attentive   Sleep:  Number of Hours: 4.75     Current Medications: Current Facility-Administered Medications  Medication Dose Route Frequency Provider Last Rate Last Dose  . alum & mag hydroxide-simeth (MAALOX/MYLANTA) 200-200-20 MG/5ML suspension 30 mL  30 mL Oral Q4H PRN Nanine Means, NP      . buPROPion [facility] SR) 12 hr tablet 150 mg  150 mg Oral Daily Nanine Means, NP   150 mg at 12/06/14 0805  . citalopram (CELEXA) tablet 10 mg  10 mg Oral Daily Craige Cotta, MD    10 mg at 12/06/14 0805  . ibuprofen (ADVIL,MOTRIN) tablet 800 mg  800 mg Oral Q8H PRN Lindwood Qua, NP   800 mg at 12/06/14 1310  . LORazepam (ATIVAN) tablet 1 mg  1 mg Oral Q8H PRN Nanine Means, NP   1 mg at 12/05/14 2120  . magnesium hydroxide (MILK OF MAGNESIA) suspension 30 mL  30 mL Oral Daily PRN Nanine Means, NP      . ondansetron [facility]) tablet 4 mg  4 mg Oral Q8H PRN Nanine Means, NP      . risperiDONE (RISPERDAL) tablet 2 mg  2 mg Oral QHS Lindwood Qua, NP  2 mg at 12/05/14 2320  . SUMAtriptan (IMITREX) tablet 25 mg  25 mg Oral Once Sheila May Agustin, NP      . zolpidem (AMBIEN) tablet 5 mg  5 mg Oral QHS PRN Nanine MeansJamison Lord, NP        Lab Results: No results found for this or any previous visit (from the past 48 hour(s)).  Physical Findings: AIMS: Facial and Oral Movements Muscles of Facial Expression: None, normal Lips and Perioral Area: None, normal Jaw: None, normal Tongue: None, normal,Extremity Movements Upper (arms, wrists, hands, fingers): None, normal Lower (legs, knees, ankles, toes): None, normal, Trunk Movements Neck, shoulders, hips: None, normal, Overall Severity Severity of abnormal movements (highest score from questions above): None, normal Incapacitation due to abnormal movements: None, normal Patient's awareness of abnormal movements (rate only patient's report): No Awareness, Dental Status Current problems with teeth and/or dentures?: No Does patient usually wear dentures?: No  CIWA:    COWS:      Assessment- at this time patient presents with some improvement compared to admission- improved eye contact, improved rate of speech,  Slightly more reactive affect. Denies any current hallucinations and does not appear internally preoccupied. Her major complaint at this time is chronic headache- husband states this has been worsening. He knows that there has been some work up done previously but is not sure of results or exact exams done.     Treatment Plan Summary: Daily contact with patient to assess and evaluate symptoms and progress in treatment, Medication management, Plan continue inpatient treatment and continue medication management as below  Wellbutrin SR 150 mgrs QAM Risperidone 2 mgrs QHS Increase Celexa to  20 mgrs QDAY Have contacted Neurology  department to request consultation regarding patient's headaches .    Medical Decision Making:  Review of Psycho-Social Stressors (1), Review or order clinical lab tests (1), Review of Medication Regimen & Side Effects (2) and Review of New Medication or Change in Dosage (2)     Devansh Riese 12/06/2014, 1:24 PM

## 2014-12-06 NOTE — Progress Notes (Signed)
D: Pt denies SI/HI/AVH. Pt is pleasant and cooperative. Pt complained of HA again. Pt wanted to take her Risperdal before she went to bed, because she stated it makes her sleepy.   A: Pt was offered support and encouragement. Pt was given scheduled medications. Pt was encourage to attend groups. Q 15 minute checks were done for safety.    R:Pt attends groups and interacts well with peers and staff. Pt is taking medication. Pt has no complaints.Pt receptive to treatment and safety maintained on unit.

## 2014-12-06 NOTE — [facility] Group Notes (Signed)
[facility] LCSW Group Therapy 12/06/2014 1:15 PM Type of Therapy: Group Therapy Participation Level: Minimal  Participation Quality: Minimal  Affect: Depressed and Flat  Cognitive: Alert and Oriented  Insight: Developing/Improving and Engaged  Engagement in Therapy: Developing/Improving and Engaged  Modes of Intervention: Clarification, Confrontation, Discussion, Education, Exploration, Limit-setting, Orientation, Problem-solving, Rapport Building, Dance movement psychotherapisteality Testing, Socialization and Support  Summary of Progress/Problems: The topic for today was feelings about relapse. Pt discussed what relapse prevention is to them and identified triggers that they are on the path to relapse. Pt processed their feeling towards relapse and was able to relate to peers. Pt discussed coping skills that can be used for relapse prevention. Patient attended group but did not participate in discussion, interpreter not available for group session. Patient was observed with her eyes closed and humming.    Samuella BruinKristin Vallie Teters, MSW, [employer]CSWA Clinical Social Worker [facility] [phone]

## 2014-12-06 NOTE — [facility] Counselor (Signed)
Adult Comprehensive Assessment  Patient ID: Catherine Gomez, female   DOB: November 03, 1990, 24 y.o.   MRN: [id number]  Information Source: Information source: Patient  Current Stressors:  Educational / Learning stressors: N/A Employment / Job issues: N/A Family Relationships: Stressful caring for children due to headaches Financial / Lack of resources (include bankruptcy): N/A Housing / Lack of housing: Lives in an apartment with husband and 2 children Physical health (include injuries & life threatening diseases): headaches, eye issues  Social relationships: N/A Substance abuse: N/A Bereavement / Loss: father died 13 years ago  Living/Environment/Situation:  Living Arrangements: Spouse/significant other Living conditions (as described by patient or guardian): Lives in an apartment with husband and 2 children How long has patient lived in current situation?: 1 year  What is atmosphere in current home: Comfortable  Family History:  Marital status: Married Number of Years Married: 7 What types of issues is patient dealing with in the relationship?: Identifies husband as supportive Does patient have children?: Yes How many children?: 2 How is patient's relationship with their children?: Stressful at times due to having headaches and caring for small children  Childhood History:  By whom was/is the patient raised?: Both parents Description of patient's relationship with caregiver when they were a child: Got along with parents Patient's description of current relationship with people who raised him/her: Speaks with mother over telephone who lives in [place] Does patient have siblings?: Yes Number of Siblings: 10 Description of patient's current relationship with siblings: brothers and sisters in [place] Did patient suffer any verbal/emotional/physical/sexual abuse as a child?: No Did patient suffer from severe childhood neglect?: No Patient description of severe childhood neglect:  N/A Has patient ever been sexually abused/assaulted/raped as an adolescent or adult?: No Was the patient ever a victim of a crime or a disaster?: No Witnessed domestic violence?: No Has patient been effected by domestic violence as an adult?: No  Education:  Highest grade of school patient has completed: Middle school Currently a Consulting civil engineer?: No Learning disability?:  (unknown)  Employment/Work Situation:   Employment situation: Unemployed Patient's job has been impacted by current illness: No What is the longest time patient has a held a job?: Patient has never been employed Where was the patient employed at that time?: N/A Has patient ever been in the [employer]?: No Has patient ever served in Buyer, retail?: No  Financial Resources:   Surveyor, quantity resources: Income from spouse Does patient have a representative payee or guardian?: No  Alcohol/Substance Abuse:   What has been your use of drugs/alcohol within the last 12 months?: Denies If attempted suicide, did drugs/alcohol play a role in this?: No Alcohol/Substance Abuse Treatment Hx: Denies past history Has alcohol/substance abuse ever caused legal problems?: No  Social Support System:   Conservation officer, nature Support System: Fair Museum/gallery exhibitions officer System: husband, mother Type of faith/religion: Muslim How does patient's faith help to cope with current illness?: Helps some but still experiencing symptoms  Leisure/Recreation:   Leisure and Hobbies: Patient could not think of any current interests  Strengths/Needs:   What things does the patient do well?: Good mother In what areas does patient struggle / problems for patient: "nothing"  Discharge Plan:   Does patient have access to transportation?: Yes Will patient be returning to same living situation after discharge?: Yes Currently receiving community mental health services: Yes (From Whom) (ACTT team but unable to remember name of agency) If no, would patient like referral  for services when discharged?: No Does patient have financial barriers  related to discharge medications?: No  Summary/Recommendations:     Patient is a 24 year old African female admitted for increasing depression and headaches. Assessment was completed with use of interpreter Hilda LiasMarie through [employer].  [place] and 2 children. She plans to return home to continue services with her ACTT team at discharge. Patient will benefit from crisis stabilization, medication evaluation, group therapy, and psycho education in addition to case management for discharge planning. Patient and CSW reviewed pt's identified goals and treatment plan. Pt verbalized understanding and agreed to treatment plan.   Idris Edmundson, West CarboKristin L. 12/06/2014

## 2014-12-06 NOTE — Progress Notes (Signed)
D) Pt has had an interpreter much of the shift and has attended some of the groups. Pt states that she is having a headache that she rates a 9. States that the lights bother her a lot. Pt rates her depression at a 0, her hopelessness at a 0 and her anxiety at a 0. Denies SI and HI. Affect is flat and mood depressed. A) Given support and interaction. Pt given Motrin and then given Imitrex. Provided with a brief 1:1 R) Pt denies SI and HI.

## 2014-12-07 DIAGNOSIS — F322 Major depressive disorder, single episode, severe without psychotic features: Principal | ICD-10-CM

## 2014-12-07 MED ORDER — CELECOXIB 200 MG PO CAPS
200.0000 mg | ORAL_CAPSULE | Freq: Two times a day (BID) | ORAL | Status: DC
  Administered 2014-12-08: 200 mg via ORAL
  Filled 2014-12-07: qty 1
  Filled 2014-12-07: qty 2
  Filled 2014-12-07 (×2): qty 1

## 2014-12-07 MED ORDER — PROMETHAZINE HCL 25 MG/ML IJ SOLN
25.0000 mg | Freq: Once | INTRAMUSCULAR | Status: DC
  Filled 2014-12-07 (×2): qty 1

## 2014-12-07 MED ORDER — PROMETHAZINE HCL 25 MG PO TABS
25.0000 mg | ORAL_TABLET | Freq: Once | ORAL | Status: AC
  Administered 2014-12-07: 25 mg via ORAL
  Filled 2014-12-07 (×2): qty 1

## 2014-12-07 MED ORDER — CITALOPRAM HYDROBROMIDE 10 MG PO TABS
10.0000 mg | ORAL_TABLET | Freq: Every day | ORAL | Status: DC
  Administered 2014-12-08: 10 mg via ORAL
  Filled 2014-12-07 (×2): qty 1

## 2014-12-07 MED ORDER — CELECOXIB 200 MG PO CAPS
200.0000 mg | ORAL_CAPSULE | Freq: Once | ORAL | Status: AC
  Administered 2014-12-07: 200 mg via ORAL
  Filled 2014-12-07: qty 1
  Filled 2014-12-07: qty 2

## 2014-12-07 MED ORDER — METHOCARBAMOL 750 MG PO TABS
750.0000 mg | ORAL_TABLET | Freq: Once | ORAL | Status: AC
  Administered 2014-12-07: 750 mg via ORAL
  Filled 2014-12-07 (×2): qty 1

## 2014-12-07 MED ORDER — METHOCARBAMOL 750 MG PO TABS
750.0000 mg | ORAL_TABLET | Freq: Three times a day (TID) | ORAL | Status: DC
  Administered 2014-12-07 – 2014-12-10 (×9): 750 mg via ORAL
  Filled 2014-12-07 (×16): qty 1

## 2014-12-07 MED ORDER — AMITRIPTYLINE HCL 25 MG PO TABS
25.0000 mg | ORAL_TABLET | Freq: Every day | ORAL | Status: DC
  Administered 2014-12-07: 25 mg via ORAL
  Filled 2014-12-07 (×3): qty 1

## 2014-12-07 MED ORDER — NAPROXEN 500 MG PO TABS
500.0000 mg | ORAL_TABLET | Freq: Two times a day (BID) | ORAL | Status: DC
Start: 2014-12-07 — End: 2014-12-07
  Administered 2014-12-07: 500 mg via ORAL
  Filled 2014-12-07 (×3): qty 1

## 2014-12-07 NOTE — Progress Notes (Signed)
[facility] obtained in chart.  Notes paint a picture of the [facility] presentation [facility]  MRI Brain obtained is normal.  [facility] records are in the chart.

## 2014-12-07 NOTE — Progress Notes (Signed)
Patient ID: Catherine Gomez, female   DOB: 01-08-1991, 24 y.o.   MRN: [id number] [facility] MD Progress Note  12/07/2014 12:41 PM [name]  MRN:  [id number] Subjective:  With help of interpreter present, patient states that she is still having headaches and is making her not feel well.    Objective : Patient interviewed via Nurse, learning disability. I have discussed case with treatment team. Patient appears depressed mood and attributes it to headache.  She is able to speak and be understood by the interpreter. She states that the headaches get so severe that it makes her anxious and not feel like herself. She is denying SI/HI but does hear distant voices from time to time.  The voices are not saying anything specific to her.  They "just call my name" Behavior on unit in good control. Visible in day room. No agitated or disruptive behaviors. Patient states that the Imitirex improved the headache but is still lingering.   Previous note:  Describes loss of visual acuity, and states she has been given prescription lenses in the past and does not use them. She does have difficulty identifying how many fingers I am holding up even at 7-8 feet away.  With patient's express consent and at her request , I spoke with her husband  Catherine Gomez ( [phone] . He states headaches have been chronic but seem to be worsening. He states she had some work up done at [facility], but he is unsure if a Head CT or MRI were done. He states he thinks they did an EEG , but is unsure.  WILL OBTAIN HEALTH RECORDS FROM [facility].  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 Suicidal ideation [R45.851] 12/04/2014  . Severe major depression without psychotic features [F32.2] 12/04/2014  . Homicidal ideation [R45.850]   . Headache [R51] 07/16/2014  . Hypersalivation  [K11.7] 07/16/2014  . Psychosis [F29] 04/08/2014  . Postpartum depression [F53] 04/07/2014  . Severe preeclampsia [O14.10] 12/18/2011  . Normal delivery [O80, Z37.9] 12/18/2011  . Urinary tract infection during pregnancy [O23.40] 05/24/2011   Total Time spent with patient: 30 minutes   Past Medical History:  Past Medical History  Diagnosis Date  . Extended spectrum beta lactamase (ESBL) resistance   . Headache(784.0)   . Depression     Past Surgical History  Procedure Laterality Date  . No past surgeries     Family History:  Family History  Problem Relation Age of Onset  . Alcohol abuse Neg Hx   . Arthritis Neg Hx   . Asthma Neg Hx   . Birth defects Neg Hx   . Cancer Neg Hx   . COPD Neg Hx   . Depression Neg Hx   . Diabetes Neg Hx   . Drug abuse Neg Hx   . Early death Neg Hx   . Hearing loss Neg Hx   . Heart disease Neg Hx   . Hyperlipidemia Neg Hx   . Hypertension Neg Hx   . Kidney disease Neg Hx   . Learning disabilities Neg Hx   . Mental illness Neg Hx   . Mental retardation Neg Hx   . Miscarriages / Stillbirths Neg Hx   . Stroke Neg Hx   . Vision loss Neg Hx    Social History:  History  Alcohol Use No     History  Drug Use No    History   Social History  . Marital Status: Married    Spouse Name: N/A  . Number of Children: N/A  . Years of Education: N/A   Social History Main Topics  . Smoking status: Never Smoker   . Smokeless tobacco: Never Used  . Alcohol Use: No  . Drug Use: No  . Sexual Activity: Yes   Other Topics Concern  . None   Social History Narrative   Additional History:    Sleep: Fair  Appetite:  Good   Assessment:   Musculoskeletal: Strength & Muscle Tone: within normal limits Gait & Station: normal Patient leans: N/A   Psychiatric Specialty Exam: Physical Exam  Vitals reviewed.   Review of Systems  Constitutional: Negative.   HENT: Negative.   Eyes: Negative.   Respiratory: Negative.   Cardiovascular:  Palpitations: mild from taking fioricet.  Gastrointestinal: Negative.   Genitourinary: Negative.   Musculoskeletal: Negative.   Skin: Negative.   Neurological: Negative.   Endo/Heme/Allergies: Negative.   Psychiatric/Behavioral: Negative.     Blood pressure 118/65, pulse 144, temperature 97.6 F (36.4 C), temperature source Oral, resp. rate 18, height 5' 4.25" (1.632 m), weight 68.947 kg (152 lb), not currently breastfeeding.Body mass index is 25.89 kg/(m^2).  General Appearance: Fairly Groomed  Patent attorneyye Contact::  improved   Speech:  Slow  Volume:  improved, less soft  Mood:  Depressed  Affect:  less constricted, does smile at times appropriately  Thought Process:  Goal Directed and Linear  Orientation:  Other:  fully alert and attentive  Thought Content:  no hallucinations today, no delusions expressed   Suicidal Thoughts:  No At this time denies any suicidal plan or intent and contracts for safety on the unit  Homicidal Thoughts:  No  Memory:  Recent and remote fair   Judgement:  Fair  Insight:  Fair  Psychomotor Activity:  Normal  Concentration:  Good  Recall:  Good  Fund of Knowledge:Good  Language: Good  Akathisia:  Negative  Handed:  Right  AIMS (if indicated):     Assets:  Desire for Improvement Resilience Social Support  ADL's:  Fair   Cognition:  Alert and attentive   Sleep:  Number of Hours: 4.5     Current Medications: Current Facility-Administered Medications  Medication Dose Route Frequency Provider Last Rate Last Dose  . alum & mag hydroxide-simeth (MAALOX/MYLANTA) 200-200-20 MG/5ML suspension 30 mL  30 mL Oral Q4H PRN Nanine MeansJamison Lord, NP      . amitriptyline (ELAVIL) tablet 25 mg  25 mg Oral QHS Saramma Eappen, MD      . buPROPion (WELLBUTRIN SR) 12 hr tablet 150 mg  150 mg Oral Daily Nanine MeansJamison Lord, NP   150 mg at 12/07/14 [id number]  . [START ON 12/08/2014] citalopram (CELEXA) tablet 10 mg  10 mg Oral Daily Saramma Eappen, MD      . ibuprofen (ADVIL,MOTRIN) tablet  800 mg  800 mg Oral Q8H PRN Lindwood QuaSheila May Steven Veazie, NP   800 mg at 12/06/14 2049  . LORazepam (ATIVAN) tablet 1 mg  1 mg Oral Q8H PRN Nanine MeansJamison Lord, NP   1 mg at 12/06/14 2234  . magnesium hydroxide (MILK OF MAGNESIA) suspension 30 mL  30 mL Oral Daily PRN Nanine MeansJamison Lord, NP      . methocarbamol (ROBAXIN) tablet 750 mg  750 mg Oral TID Velna HatchetSheila May Marijayne Rauth, NP      . naproxen (NAPROSYN) tablet 500 mg  500 mg Oral BID WC  Velna Hatchet May Tayon Parekh, NP      . ondansetron [facility]) tablet 4 mg  4 mg Oral Q8H PRN Nanine Means, NP      . risperiDONE (RISPERDAL) tablet 2 mg  2 mg Oral QHS Karstyn Birkey May Delois Silvester, NP   2 mg at 12/06/14 2329    Lab Results: No results found for this or any previous visit (from the past 48 hour(s)).  Physical Findings: AIMS: Facial and Oral Movements Muscles of Facial Expression: None, normal Lips and Perioral Area: None, normal Jaw: None, normal Tongue: None, normal,Extremity Movements Upper (arms, wrists, hands, fingers): None, normal Lower (legs, knees, ankles, toes): None, normal, Trunk Movements Neck, shoulders, hips: None, normal, Overall Severity Severity of abnormal movements (highest score from questions above): None, normal Incapacitation due to abnormal movements: None, normal Patient's awareness of abnormal movements (rate only patient's report): No Awareness, Dental Status Current problems with teeth and/or dentures?: No Does patient usually wear dentures?: No  CIWA:    COWS:      Assessment- at this time patient presents with some improvement compared to admission- improved eye contact, improved rate of speech,  Slightly more reactive affect. Denies any current hallucinations and does not appear internally preoccupied. Her major complaint at this time is chronic headache- husband states this has been worsening. He knows that there has been some work up done previously but is not sure of results or exact exams done.   Treatment Plan Summary: Daily contact with patient  to assess and evaluate symptoms and progress in treatment, Medication management, Plan continue inpatient treatment and continue medication management as below  Wellbutrin SR 150 mgrs QAM Risperidone 2 mgrs QHS Decreased Celexa to  10 mgrs QDAY.  Elavil 25 mg QHS to help with mood sx and residual headaches Robaxin and Naproxyn TID for residual headache maintenance (discussed with Lutricia Feil D)  Have contacted Neurology  department to request consultation regarding patient's headaches .    Medical Decision Making:  Review of Psycho-Social Stressors (1), Review or order clinical lab tests (1), Review of Medication Regimen & Side Effects (2) and Review of New Medication or Change in Dosage (2)  Wafa Martes MAY AGNP-BC 12/07/2014, 12:41 PM

## 2014-12-07 NOTE — [facility] Group Notes (Signed)
[facility] Group Notes:  (Clinical Social Work)  12/07/2014     10-11AM  Summary of Progress/Problems:   The main focus of today's process group was to learn how to use a decisional balance exercise to move forward in the Stages of Change, which were described and discussed.  Motivational Interviewing and a worksheet were utilized to help patients explore in depth the perceived benefits and costs of a self-sabotaging behavior, as well as the  benefits and costs of replacing that with a healthy coping mechanism.   The patient expressed little during group, and had long conversations with the interpreter when asked a question, rather than responding.  Type of Therapy:  Group Therapy - Process   Participation Level:  Minimal  Participation Quality:  Attentive  Affect:  Blunted  Cognitive:  Alert  Insight:  Limited  Engagement in Therapy:  Limited  Modes of Intervention:  Education, Motivational Interviewing  Ambrose MantleMareida Grossman-Orr, LCSW 12/07/2014, 12:32 PM

## 2014-12-07 NOTE — Progress Notes (Signed)
D: Pt denies SI/HI/AVH. Pt is pleasant and cooperative. Pt continues to endorse HA. Pt stated she would try to close her eyes but hear her voice talking. Pt appears to be experiencing some type of Hallucinations- Auditory or visual, but pt has a hard time explaining what she is experiencing.   A: Pt was offered support and encouragement. Pt was given scheduled medications. Pt was encourage to attend groups. Q 15 minute checks were done for safety.   R:Pt attends groups and interacts well with peers and staff. Pt is taking medication .Pt receptive to treatment and safety maintained on unit.

## 2014-12-07 NOTE — Progress Notes (Signed)
D: Pt observed in dayroom with peers and interpreter during scheduled groups. Pt presents with depressed mood and flat affect. Gait is steady but slow.  A: Emotional support, encouragement and availability offered. Medications administered as per MD's orders, including new order for Robaxin and Naprosyn for c/o headache unrelieved by Motrin on 12/06/14. Q 15 minutes checks maintained for suicide without gestures / event of self injurious behavior to note at present.  R: Pt reports her depression 8/10, hopelessness 0/10 when assessed. Verbalized her needs appropriately via interpreter. Paranoid about her confidentiality r/t to conversations with interpreter (assessments) reassurance (HIPPA) offered. Safety maintained. No adverse drug reactions noted thus far this shift. POC continues, pt compliant with some verbal encouragement.

## 2014-12-07 NOTE — Progress Notes (Signed)
Goals  Group Note  Date:  12/07/14 Time: 0930  Group Topic/Focus: : Goals Group : The focus of the group is to help patients identify daily goals they want to accomplish and strategies they can use to accomplish their goals.   Participation Level:  Active  Participation Quality: good  Affect: flat  Cognitive:  Intact  Insight:  Unable to deternmine  Engagement in Group: engaged  Additional Comments: patient's interpreter was present and interpreted from group facilitator and patient responded.  PD [facility]

## 2014-12-07 NOTE — Progress Notes (Signed)
Psychoeducational Group Note  Date:  12/07/2014 Time:   1500  Group Topic/Focus:  Identifying Needs:   The focus of this group is to help patients identify their personal needs that have been historically problematic and identify healthy behaviors to address their needs.  Participation Level:  Minimal  Participation Quality:  Attentive  Affect:  Depressed  Cognitive:  Lacking  Insight:  Resistant  Engagement in Group:  None  Additional Comments:    12/07/2014,6:44 PM Clorene Nerio, Joie BimlerPatricia Lynn

## 2014-12-07 NOTE — Plan of Care (Signed)
Problem: Alteration in mood Goal: STG-Patient reports thoughts of self-harm to staff Outcome: Progressing Pt denied SI when assessed. Verbally contracted for safety. Q 15 minutes checks maintained without gestures / event of self injurious behavior to note at this time.

## 2014-12-07 NOTE — Progress Notes (Signed)
D: Pt denies SI/HI/AVh.  Pt observably bothered by HA earlier in the shift. Per PA on call pt was given 1x dose of  25 mg Phenergan, and was to repeat if pt not appear better in 1 hour. PT not given 2nd dose. Pt was given 1x dose 750 robaxin and celebrex at 2300. Per PA the naprosyn was Dc'd and Celebrex Bid was added in it's place. Pt stated she did not want to take the Ibuprofen anymore, per pt husband he stated that has caused her heart to beat fast in the past. Pt did complain of being dizzy, so medications were brought to pt as requested. Pt jumped out of bed very fast and stood up and walked out of the room to take her medications. PT did not present like she was dizzy even though she claimed to have dizziness.    A: Pt was offered support and encouragement. Pt was given scheduled medications. Pt was encourage to attend groups. Q 15 minute checks were done for safety.   R:Pt attends groups and interacts well with peers and staff. Pt is taking medication.  safety maintained on unit.

## 2014-12-08 DIAGNOSIS — F322 Major depressive disorder, single episode, severe without psychotic features: Secondary | ICD-10-CM

## 2014-12-08 MED ORDER — SUMATRIPTAN SUCCINATE 50 MG PO TABS: 100.0000 mg | ORAL_TABLET | ORAL | Status: DC | PRN

## 2014-12-08 MED ORDER — KETOROLAC TROMETHAMINE 60 MG/2ML IM SOLN
30.0000 mg | Freq: Once | INTRAMUSCULAR | Status: DC
  Filled 2014-12-08: qty 2

## 2014-12-08 MED ORDER — SUMATRIPTAN SUCCINATE 100 MG PO TABS: 100.0000 mg | ORAL_TABLET | Freq: Once | ORAL | Status: DC

## 2014-12-08 MED ORDER — PROPRANOLOL HCL 10 MG PO TABS
10.0000 mg | ORAL_TABLET | Freq: Two times a day (BID) | ORAL | Status: DC
  Administered 2014-12-08 – 2014-12-16 (×15): 10 mg via ORAL
  Filled 2014-12-08 (×23): qty 1

## 2014-12-08 MED ORDER — NAPROXEN 500 MG PO TABS
500.0000 mg | ORAL_TABLET | Freq: Two times a day (BID) | ORAL | Status: DC
  Administered 2014-12-08 – 2014-12-09 (×2): 500 mg via ORAL
  Filled 2014-12-08 (×6): qty 1

## 2014-12-08 NOTE — Plan of Care (Signed)
Problem: Ineffective individual coping Goal: STG: Patient will remain free from self harm Outcome: Progressing Pt remains safe on the unit     

## 2014-12-08 NOTE — Progress Notes (Signed)
Patient ID: Jalan Bodi, female   DOB: 05/24/1991, 24 y.o.   MRN: [id number] Patient ID: Candid Bovey, female   DOB: 17-Apr-1991, 24 y.o.   MRN: [id number] [facility] MD Progress Note  12/08/2014 11:34 AM Debany Scannell  MRN:  [id number] Subjective:  Motioning that she still has headache.  No interpreter present.  Patient able to verbalize that she did not think medications were working.  Husband also called via telephone stating same.     Objective: I have discussed case with treatment team. Patient still c/o headache.  She verbalized that she did not think meds were working and was confirmed via phone to husband that is able to speak [place].  Explained to patient that it takes times for medications to work.   Spoke in length with husband who states that wife had been on several psychotropic meds (Wellbutrin, Risperdal, Zyprexa and Prozac).  None of these meds have been effective and he felt that it made his wife worse.  He did confirm that the ACT Team [facility] was managing meds but she does not feel like having them at her house at times.  Husband also stated that he may bring patient back to [facility] for further evaluation.   [employer] records obtained and in chart.  Brain MRI was negative in Jan 2015  Today, patient would not elaborate on voices or depression.  Her primary concern was the headache.  Neurology called and discussed in length.  They are going to consult tomorrow.  Toradol IM was recommended but patient refused.    Behavior on unit in good control. Visible in day room. No agitated or disruptive behaviors.  Previous note:  Describes loss of visual acuity, and states she has been given prescription lenses in the past and does not use them. She does have difficulty identifying how many fingers I am holding up even at 7-8 feet away.   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 Suicidal ideation [R45.851] 12/04/2014  . Severe major depression without psychotic features [F32.2] 12/04/2014  . Homicidal ideation [R45.850]   . Headache [R51] 07/16/2014  . Hypersalivation [K11.7] 07/16/2014  . Psychosis [F29] 04/08/2014  . Postpartum depression [F53] 04/07/2014  . Severe preeclampsia [O14.10] 12/18/2011  . Normal delivery [O80, Z37.9] 12/18/2011  . Urinary tract infection during pregnancy [O23.40] 05/24/2011   Total Time spent with patient: 30 minutes   Past Medical History:  Past Medical History  Diagnosis Date  . Extended spectrum beta lactamase (ESBL) resistance   . Headache(784.0)   . Depression     Past Surgical History  Procedure Laterality Date  . No past surgeries     Family History:  Family History  Problem Relation Age of Onset  . Alcohol abuse Neg Hx   . Arthritis Neg Hx   . Asthma Neg Hx   . Birth defects Neg Hx   . Cancer Neg Hx   . COPD Neg Hx   . Depression Neg Hx   . Diabetes Neg Hx   . Drug abuse Neg Hx   . Early death Neg Hx   . Hearing loss Neg Hx   . Heart disease Neg Hx   . Hyperlipidemia Neg Hx   . Hypertension Neg Hx   . Kidney disease Neg Hx   . Learning disabilities Neg Hx   . Mental illness Neg Hx   .  Mental retardation Neg Hx   . Miscarriages / Stillbirths Neg Hx   . Stroke Neg Hx   . Vision loss Neg Hx    Social History:  History  Alcohol Use No     History  Drug Use No    History   Social History  . Marital Status: Married    Spouse Name: N/A  . Number of Children: N/A  . Years of Education: N/A   Social History Main Topics  . Smoking status: Never Smoker   . Smokeless tobacco: Never Used  . Alcohol Use: No  . Drug Use: No  . Sexual Activity: Yes   Other Topics Concern  . None   Social History Narrative   Additional History:    Sleep: Fair  Appetite:  Good   Assessment:   Musculoskeletal: Strength & Muscle Tone:  within normal limits Gait & Station: normal Patient leans: N/A   Psychiatric Specialty Exam: Physical Exam  Vitals reviewed.   Review of Systems  Constitutional: Negative.   HENT: Negative.   Eyes: Negative.   Respiratory: Negative.   Cardiovascular: Negative.   Gastrointestinal: Negative.   Genitourinary: Negative.   Musculoskeletal: Negative.   Skin: Negative.   Neurological: Negative.   Endo/Heme/Allergies: Negative.   Psychiatric/Behavioral: Positive for hallucinations (auditory). The patient is nervous/anxious.     Blood pressure 127/82, pulse 120, temperature 98.6 F (37 C), temperature source Oral, resp. rate 18, height 5' 4.25" (1.632 m), weight 68.947 kg (152 lb), not currently breastfeeding.Body mass index is 25.89 kg/(m^2).  General Appearance: Fairly Groomed  Patent attorney::  improved   Speech:  Slow  Volume:  improved, less soft  Mood:  Depressed  Affect:  less constricted, does smile at times appropriately  Thought Process:  Goal Directed and Linear  Orientation:  Other:  fully alert and attentive  Thought Content:  no hallucinations today, no delusions expressed   Suicidal Thoughts:  No At this time denies any suicidal plan or intent and contracts for safety on the unit  Homicidal Thoughts:  No  Memory:  Recent and remote fair   Judgement:  Fair  Insight:  Fair  Psychomotor Activity:  Normal  Concentration:  Good  Recall:  Good  Fund of Knowledge:Good  Language: Good  Akathisia:  Negative  Handed:  Right  AIMS (if indicated):     Assets:  Desire for Improvement Resilience Social Support  ADL's:  Fair   Cognition:  Alert and attentive   Sleep:  Number of Hours: 6     Current Medications: Current Facility-Administered Medications  Medication Dose Route Frequency Provider Last Rate Last Dose  . alum & mag hydroxide-simeth (MAALOX/MYLANTA) 200-200-20 MG/5ML suspension 30 mL  30 mL Oral Q4H PRN Nanine Means, NP      . amitriptyline (ELAVIL) tablet  25 mg  25 mg Oral QHS Jomarie Longs, MD   25 mg at 12/07/14 2114  . buPROPion (WELLBUTRIN SR) 12 hr tablet 150 mg  150 mg Oral Daily Nanine Means, NP   150 mg at 12/08/14 0756  . LORazepam (ATIVAN) tablet 1 mg  1 mg Oral Q8H PRN Nanine Means, NP   1 mg at 12/06/14 2234  . magnesium hydroxide (MILK OF MAGNESIA) suspension 30 mL  30 mL Oral Daily PRN Nanine Means, NP      . methocarbamol (ROBAXIN) tablet 750 mg  750 mg Oral TID Velna Hatchet May Jb Dulworth, NP   750 mg at 12/08/14 0757  . naproxen (NAPROSYN) tablet 500 mg  500 mg Oral BID WC Rockey SituFernando A Cobos, MD      . ondansetron (ZOFRAN) tablet 4 mg  4 mg Oral Q8H PRN Nanine MeansJamison Lord, NP      . propranolol (INDERAL) tablet 10 mg  10 mg Oral BID Velna HatchetSheila May Rahman Ferrall, NP   10 mg at 12/08/14 1024  . risperiDONE (RISPERDAL) tablet 2 mg  2 mg Oral QHS Lindwood QuaSheila May Kong Packett, NP   2 mg at 12/07/14 2114  . SUMAtriptan (IMITREX) tablet 100 mg  100 mg Oral Q2H PRN Craige CottaFernando A Cobos, MD        Lab Results: No results found for this or any previous visit (from the past 48 hour(s)).  Physical Findings: AIMS: Facial and Oral Movements Muscles of Facial Expression: None, normal Lips and Perioral Area: None, normal Jaw: None, normal Tongue: None, normal,Extremity Movements Upper (arms, wrists, hands, fingers): None, normal Lower (legs, knees, ankles, toes): None, normal, Trunk Movements Neck, shoulders, hips: None, normal, Overall Severity Severity of abnormal movements (highest score from questions above): None, normal Incapacitation due to abnormal movements: None, normal Patient's awareness of abnormal movements (rate only patient's report): No Awareness, Dental Status Current problems with teeth and/or dentures?: No Does patient usually wear dentures?: No  CIWA:    COWS:      Assessment- at this time patient presents with some improvement compared to admission- improved eye contact, improved rate of speech,  Slightly more reactive affect. Denies any current  hallucinations and does not appear internally preoccupied. Her major complaint at this time is chronic headache- husband states this has been worsening. He knows that there has been some work up done previously but is not sure of results or exact exams done.   Treatment Plan Summary: Daily contact with patient to assess and evaluate symptoms and progress in treatment, Medication management, Plan continue inpatient treatment and continue medication management as below  Wellbutrin SR 150 mgrs QAM Risperidone 2 mgrs QHS Decreased Celexa to  10 mgrs QDAY.   D/C Elavil 25 mg QHS to help with mood sx and residual headaches Robaxin and Celebrex for residual headache maintenance Have contacted Neurology  department to request consultation regarding patient's headaches -  They will consult in the morning.  Spoke with D Rinehuls PA-C  Medical Decision Making:  Review of Psycho-Social Stressors (1), Review or order clinical lab tests (1), Review of Medication Regimen & Side Effects (2) and Review of New Medication or Change in Dosage (2)  Liana Camerer MAY AGNP-BC 12/08/2014, 11:34 AM

## 2014-12-08 NOTE — Plan of Care (Signed)
Problem: Diagnosis: Increased Risk For Suicide Attempt Goal: STG-Patient Will Report Suicidal Feelings to Staff Outcome: Progressing Pt denied SI, verbally contracts for safety. No gestures or event of self injurious behavior to note at this time.

## 2014-12-08 NOTE — Progress Notes (Signed)
Discharge Planning Group Note  Date:  12/08/2014 Time:   0930  Group Topic/Focus:    Discharge Planning Group: The focus of the group is to help patients identify what their discharge plan looks like and what parts of the plan need to be completed, in order for their discharge to take place.  Participation Level:  None  Participation Quality:  Poor  Affect:  Flat  Cognitive:  Unknown  Insight:  Unknown  Engagement in Group:  Unknown  Additional Comments:    Rich Braveuke, Olyvia Gopal Lynn 10:24 AM. 12/08/2014

## 2014-12-08 NOTE — Progress Notes (Addendum)
D: Pt alert, in dayroom with interpreter conversing at present. Observed in dayroom with peers for scheduled unit groups. Attended groups as scheduled.   A: Medication education done with pt ordered meds (Inderal and Imitrex). Vitals done and rechecked post administration of inderal and documented. Emotional support, availability and encouragement offered to pt to assist her comply with ordered medications for persistent headache. 1:1 meeting was held with pt and interpreter to disucss ordered pain regimen and compliance issue as well as records / tests obtained [facility]/14 from pt's last admission there.    R: Pt continues to report headaches not relieved by ordered medications (Robaxin, Naprosyn etc). Requested to be d/c, wants husband to pick her up and [facility] for [facility] treatment. Denied SI/ HI, AVH and poor appetite when assessed. Remains safe on unit. Q 15 minutes checks effective as ordered.

## 2014-12-08 NOTE — [facility] Group Notes (Signed)
[facility] Group Notes:  (Clinical Social Work)  12/08/2014  10:00-11:00AM  Summary of Progress/Problems:   The main focus of today's process group was to   1)  discuss the importance of adding supports  2)  define health supports versus unhealthy supports  3)  identify the patient's current unhealthy supports and plan how to handle them  4)  Identify the patient's current healthy supports and plan what to add.  An emphasis was placed on using counselor, doctor, therapy groups, 12-step groups, and problem-specific support groups to expand supports.    The patient expressed full comprehension of the concepts presented, and agreed that there is a need to add more supports.  The patient stated the medications, specifically an injection, are her healthy supports, and she has no unhealthy supports.  Type of Therapy:  Process Group with Motivational Interviewing  Participation Level:  Minimal  Participation Quality:  Attentive  Affect:  Blunted  Cognitive:  Alert  Insight:  Limited  Engagement in Therapy:  Limited  Modes of Intervention:   Education, Support and Processing, Activity  Catherine MantleMareida Grossman-Orr, LCSW 12/08/2014, 12:15pm

## 2014-12-08 NOTE — Progress Notes (Signed)
[facility] Group Notes:  (Nursing/MHT/Case Management/Adjunct)  Date:  12/08/2014  Time:  4:23 AM  Type of Therapy:  Psychoeducational Skills  Participation Level:  Minimal  Participation Quality:  Inattentive  Affect:  Depressed and Flat  Cognitive:  Lacking  Insight:  None  Engagement in Group:  Limited  Modes of Intervention:  Education  Summary of Progress/Problems: The patient presented herself last evening the same as she did the two previous evenings for group. The patient mentioned that she had headaches throughout the day and therefore had a bad day. No other details were provided about her day. As a theme for the day, her support system will be her husband.   Hazle CocaGOODMAN, Zacchaeus Halm S 12/08/2014, 4:23 AM

## 2014-12-08 NOTE — Plan of Care (Signed)
Problem: Alteration in mood Goal: STG-Patient is able to discuss feelings and issues (Patient is able to discuss feelings and issues leading to depression)  Outcome: Progressing Pt is able to verbalize her needs / concerns r/t pain well via interpreter.

## 2014-12-09 DIAGNOSIS — R51 Headache: Secondary | ICD-10-CM

## 2014-12-09 MED ORDER — NAPROXEN 250 MG PO TABS
250.0000 mg | ORAL_TABLET | Freq: Three times a day (TID) | ORAL | Status: DC
  Administered 2014-12-09 – 2014-12-10 (×2): 250 mg via ORAL
  Filled 2014-12-09 (×9): qty 1

## 2014-12-09 MED ORDER — TOPIRAMATE 25 MG PO TABS
25.0000 mg | ORAL_TABLET | Freq: Every day | ORAL | Status: DC
  Administered 2014-12-09 – 2014-12-11 (×3): 25 mg via ORAL
  Filled 2014-12-09 (×7): qty 1

## 2014-12-09 MED ORDER — RISPERIDONE 3 MG PO TABS
3.0000 mg | ORAL_TABLET | Freq: Every day | ORAL | Status: DC
  Administered 2014-12-09: 3 mg via ORAL
  Filled 2014-12-09 (×3): qty 1

## 2014-12-09 MED ORDER — BUPROPION HCL ER (XL) 300 MG PO TB24: 300.0000 mg | ORAL_TABLET | Freq: Every day | ORAL | Status: DC

## 2014-12-09 MED ORDER — CITALOPRAM HYDROBROMIDE 10 MG PO TABS
10.0000 mg | ORAL_TABLET | Freq: Every day | ORAL | Status: DC
  Administered 2014-12-10: 10 mg via ORAL
  Filled 2014-12-09 (×3): qty 1

## 2014-12-09 MED ORDER — BUPROPION HCL ER (XL) 300 MG PO TB24
300.0000 mg | ORAL_TABLET | Freq: Every day | ORAL | Status: DC
  Administered 2014-12-10: 300 mg via ORAL
  Filled 2014-12-09 (×3): qty 1

## 2014-12-09 MED ORDER — CITALOPRAM HYDROBROMIDE 20 MG PO TABS: 20.0000 mg | ORAL_TABLET | Freq: Every day | ORAL | Status: DC

## 2014-12-09 NOTE — Progress Notes (Signed)
Pt was observed by another RN standing in the hallway by the phone with her eyes closed asleep.  Pt refuses to go to her room to lie down.

## 2014-12-09 NOTE — Progress Notes (Signed)
The patient has been awake for nearly the entire evening except for sleeping on her floor for close to an hour. She continues to walk out into the hallway with her arms crossed and her eyes half shut. She is refusing to go back to her room. When asked if she has any needs, the patient offers no response.

## 2014-12-09 NOTE — [facility] Group Notes (Signed)
[facility] LCSW Group Therapy 12/09/2014  1:15 pm  Type of Therapy: Group Therapy Participation Level: Active  Participation Quality: Attentive, Sharing and Supportive  Affect: Depressed and Flat  Cognitive: Alert and Oriented  Insight: Developing/Improving and Engaged  Engagement in Therapy: Developing/Improving and Engaged  Modes of Intervention: Clarification, Confrontation, Discussion, Education, Exploration,  Limit-setting, Orientation, Problem-solving, Rapport Building, Dance movement psychotherapisteality Testing, Socialization and Support  Summary of Progress/Problems: Pt identified obstacles faced currently and processed barriers involved in overcoming these obstacles. Pt identified steps necessary for overcoming these obstacles and explored motivation (internal and external) for facing these difficulties head on. Pt further identified one area of concern in their lives and chose a goal to focus on for today. Patient attended group and participated with the assistance of interpreter Hilda LiasMarie from [employer]. Patient identified her goal as to feel better and get rid of her headaches in order to better care for her children. CSW and other group members provided patient with emotional support and encouragement.  Catherine Gomez, Catherine Gomez, Catherine Gomez Clinical Social Worker [facility] [phone]

## 2014-12-09 NOTE — Progress Notes (Signed)
The focus of this group is to help patients review their daily goal of treatment and discuss progress on daily workbooks. Pt reported to day room for group with interpreter. Pt stayed through introductions, but left group before her turn to shared about how her day went.

## 2014-12-09 NOTE — [facility] Group Notes (Signed)
   [facility]  12/09/2014  8:45 AM   Participation Quality: Alert, Appropriate and Oriented  Mood/Affect: Depressed and Flat  Depression Rating: Patient endorses feeling some depression today  Anxiety Rating: Patient endorses feeling some anxiety today  Thoughts of Suicide: Pt denies SI/HI  Will you contract for safety? Yes  Current AVH: Pt denies  Plan for Discharge/Comments: Pt attended discharge planning group and actively participated in group. CSW provided pt with today's workbook. Patient reports that she is not feeling well today due to experiencing headaches and feeling "dizzy". She reports feeling worse today. She plans to return home to follow up with her ACTT team for outpatient services.  Transportation Means: Pt reports access to transportation  Supports: No supports mentioned at this time  Samuella BruinKristin Shalona Harbour, MSW, [employer]CSWA Clinical Social Worker [facility] [phone]

## 2014-12-09 NOTE — Progress Notes (Signed)
Psychoeducational Group Note  Date:  12/09/2014 Time:  0051  Group Topic/Focus:  Wrap-Up Group:   The focus of this group is to help patients review their daily goal of treatment and discuss progress on daily workbooks.  Participation Level: Did Not Attend  Participation Quality:  Not Applicable  Affect:  Not Applicable  Cognitive:  Not Applicable  Insight:  Not Applicable  Engagement in Group: Not Applicable  Additional Comments:  The patient did not attend last evening's group despite this author's encouragement. The patient elected to remain in the hallway with her interpreter.   Hazle CocaGOODMAN, Notnamed Croucher S 12/09/2014, 12:51 AM

## 2014-12-09 NOTE — Consult Note (Signed)
Consult Reason for Consult:headache Referring Physician: Dr Jama Flavorsobos  CC: headache  HPI: Catherine Gomez is an 24 y.o. [facility]. She has a history of chronic headaches for which she has been evaluated in the ED multiple times. History obtained via Nurse, learning disabilitytranslator. Currently reporting a persistent generalized headache this is consistent with her other headaches. She had a head CT completed 06/2014 which was unremarkable. (imaging reviewed). Headache started around 1 year ago. Described as a generalized pressure type headache, occuring multiple times per week. Worse when she is stressed or anxious. +Nausea and photophobia with the headache. No visual changes. Reports trying multiple medications with no benefit. (unsure of name) Per review of old records there is a MRI brain with and without contrast [facility] on [facility]. Unable to review images but report is unremarkable.   Past Medical History  Diagnosis Date  . Extended spectrum beta lactamase (ESBL) resistance   . Headache(784.0)   . Depression     Past Surgical History  Procedure Laterality Date  . No past surgeries      Family History  Problem Relation Age of Onset  . Alcohol abuse Neg Hx   . Arthritis Neg Hx   . Asthma Neg Hx   . Birth defects Neg Hx   . Cancer Neg Hx   . COPD Neg Hx   . Depression Neg Hx   . Diabetes Neg Hx   . Drug abuse Neg Hx   . Early death Neg Hx   . Hearing loss Neg Hx   . Heart disease Neg Hx   . Hyperlipidemia Neg Hx   . Hypertension Neg Hx   . Kidney disease Neg Hx   . Learning disabilities Neg Hx   . Mental illness Neg Hx   . Mental retardation Neg Hx   . Miscarriages / Stillbirths Neg Hx   . Stroke Neg Hx   . Vision loss Neg Hx     Social History:  reports that she has never smoked. She has never used smokeless tobacco. She reports that she does not drink alcohol or use illicit drugs.  Allergies  Allergen Reactions  . Other Hives and Itching     Depression med (name unknown)    Medications:  Scheduled: . buPROPion  150 mg Oral Daily  . methocarbamol  750 mg Oral TID  . naproxen  500 mg Oral BID WC  . propranolol  10 mg Oral BID  . risperiDONE  2 mg Oral QHS    ROS: Out of a complete 14 system review, the patient complains of only the following symptoms, and all other reviewed systems are negative.  Physical Examination: Filed Vitals:   12/09/14 0818  BP: 103/62  Pulse: 83  Temp:   Resp:    Physical Exam  Constitutional: He appears well-developed and well-nourished.  Psych: Affect appropriate to situation Eyes: No scleral injection HENT: No OP obstrucion Head: Normocephalic.  Cardiovascular: Normal rate and regular rhythm.  Respiratory: Effort normal and breath sounds normal.  GI: Soft. Bowel sounds are normal. No distension. There is no tenderness.  Skin: WDI  Neurologic Examination (limited due to language) Mental Status: Alert, oriented, thought content appropriate.   Cranial Nerves: II: unable to visualize fundi due to pupil size, visual fields grossly normal, pupils equal, round, reactive to light and accommodation III,IV, VI: ptosis not present, extra-ocular motions intact bilaterally V,VII: smile symmetric, facial light touch sensation normal bilaterally VIII: hearing normal bilaterally IX,X: cough reflex present  XI: trapezius strength/neck flexion strength normal bilaterally XII: tongue strength normal  Motor: Moves all extremities symmetrically and against light resistance Tone and bulk:normal tone throughout; no atrophy noted Sensory: Pinprick and light touch intact throughout, bilaterally Deep Tendon Reflexes: 2+ and symmetric throughout Plantars: Right: downgoing   Left: downgoing Cerebellar: normal finger-to-nose, normal rapid alternating movements and normal heel-to-shin test Gait: walks without assistance  Laboratory Studies:   Basic Metabolic Panel:  Recent Labs Lab  12/03/14 1423  NA 139  K 3.5  CL 111  CO2 22  GLUCOSE 88  BUN 13  CREATININE 0.65  CALCIUM 9.2    Liver Function Tests:  Recent Labs Lab 12/03/14 1423  AST 17  ALT 15  ALKPHOS 83  BILITOT 0.8  PROT 8.7*  ALBUMIN 4.7   No results for input(s): LIPASE, AMYLASE in the last 168 hours. No results for input(s): AMMONIA in the last 168 hours.  CBC:  Recent Labs Lab 12/03/14 1423  WBC 7.9  HGB 11.9*  HCT 38.3  MCV 83.8  PLT 219    Cardiac Enzymes: No results for input(s): CKTOTAL, CKMB, CKMBINDEX, TROPONINI in the last 168 hours.  BNP: Invalid input(s): POCBNP  CBG: No results for input(s): GLUCAP in the last 168 hours.  Microbiology: Results for orders placed or performed during the hospital encounter of 04/07/14  Urine culture     Status: None   Collection Time: 04/07/14 12:48 PM  Result Value Ref Range Status   Specimen Description URINE, CLEAN CATCH  Final   Special Requests NONE  Final   Culture  Setup Time   Final    04/08/2014 04:51 Performed at [employer] Count   Final    30,000 COLONIES/ML Performed at [facility]   Culture   Final    ESCHERICHIA COLI Performed at [facility]   Report Status 04/10/2014 FINAL  Final   Organism ID, Bacteria ESCHERICHIA COLI  Final      Susceptibility   Escherichia coli - MIC*    AMPICILLIN >=32 RESISTANT Resistant     CEFAZOLIN <=4 SENSITIVE Sensitive     CEFTRIAXONE <=1 SENSITIVE Sensitive     CIPROFLOXACIN <=0.25 SENSITIVE Sensitive     GENTAMICIN <=1 SENSITIVE Sensitive     LEVOFLOXACIN <=0.12 SENSITIVE Sensitive     NITROFURANTOIN 32 SENSITIVE Sensitive     TOBRAMYCIN <=1 SENSITIVE Sensitive     TRIMETH/SULFA <=20 SENSITIVE Sensitive     PIP/TAZO <=4 SENSITIVE Sensitive     * ESCHERICHIA COLI    Coagulation Studies: No results for input(s): LABPROT, INR in the last 72 hours.  Urinalysis: No results for input(s): COLORURINE, LABSPEC, PHURINE, GLUCOSEU, HGBUR,  BILIRUBINUR, KETONESUR, PROTEINUR, UROBILINOGEN, NITRITE, LEUKOCYTESUR in the last 168 hours.  Invalid input(s): APPERANCEUR  Lipid Panel:  No results found for: CHOL, TRIG, HDL, CHOLHDL, VLDL, LDLCALC  HgbA1C: No results found for: HGBA1C  Urine Drug Screen:     Component Value Date/Time   LABOPIA NONE DETECTED 12/03/2014 1429   COCAINSCRNUR NONE DETECTED 12/03/2014 1429   LABBENZ NONE DETECTED 12/03/2014 1429   AMPHETMU NONE DETECTED 12/03/2014 1429   THCU NONE DETECTED 12/03/2014 1429   LABBARB NONE DETECTED 12/03/2014 1429    Alcohol Level:  Recent Labs Lab 12/03/14 1421  ETH <5     Imaging: No results found.   Assessment/Plan:  23y/o woman with history of depression and chronic headache presenting to [facility] ED with suicidal ideation. Admitted to [facility] for further evaluation  and treatment. Neurology consulted for history of chronic headache. Exam is non-focal. Had MRI brain from 04/2014 which is unremarkable.   -can start topamax  qhs for preventive therapy -MRA and MRV of head. (these can be done as outpatient) -will need outpatient neurology follow up upon discharge from [facility], DO [facility] [phone]  If 7pm- 7am, please page neurology on call as listed in AMION. 12/09/2014, 10:13 AM

## 2014-12-09 NOTE — Progress Notes (Addendum)
Patient ID: Catherine Gomez, female   DOB: 07/15/1991, 24 y.o.   MRN: [id number] She has been up and to parts of groups will interact with peers and staff when she is spoken to. Interpreter  At her side today. She has c/o having a having a headache today and was seen by hospitalist  And topomax was ordered to help with the h/a.  She was given ativan prn at 11:58 and it did help with her depression. Her affect was brighter spoke in a louder voice and said she felt better.  Self inventory this AM: depression 8, hopelessness 10 anxiety.;

## 2014-12-09 NOTE — Progress Notes (Addendum)
Patient ID: Samra Pesch, female   DOB: [birth year], 24 y.o.   MRN: [id number] [facility] MD Progress Note  12/09/2014 1:15 PM Laural Fake  MRN:  [id number] Subjective:  Patient states she is not doing well. She states she is experiencing chronic headache and is frustrated it is not being treated as aggressively as she would want to . She states " I don't feel anyone is helping me really".   Objective: I have discussed case with treatment team. I have also met with patient , with interpreter. Patient has remained depressed, sad, and at times irritable . Today her eye contact is fair, her speech is soft, and she becomes tearful when talking.  She denies any SI , and at this time denies active hallucinations. States she does hear voices at times, but that she cannot make out what they say. She has been visible on unit, sitting in day room , going to some groups. Today, as above, expresses a sense of frustration and reports she feels she is not being helped, and complains of ongoing and severe headaches. I appreciate Neurology consult and recommendations. Patient submitted a 72 hour letter requesting discharge earlier today. She does express a desire to try to arrange for a family meeting with her husband before she is discharged. Patient denies medication side effects, but today states she feels medications are helping " only a little", and also expresses concern that medications themselves have contributed to headache. She responds fairly well to support , empathy, and review of concerns. No disruptive or agitated behaviors .    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 Major depressive disorder, single episode, severe without psychotic features [F32.2]   . 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 Suicidal ideation [R45.851] 12/04/2014  . Severe major depression without  psychotic features [F32.2] 12/04/2014  . Homicidal ideation [R45.850]   . Headache [R51] 07/16/2014  . Hypersalivation [K11.7] 07/16/2014  . Psychosis [F29] 04/08/2014  . Postpartum depression [F53] 04/07/2014  . Severe preeclampsia [O14.10] 12/18/2011  . Normal delivery [O80, Z37.9] 12/18/2011  . Urinary tract infection during pregnancy [O23.40] 05/24/2011   Total Time spent with patient: 25 minutes    Past Medical History:  Past Medical History  Diagnosis Date  . Extended spectrum beta lactamase (ESBL) resistance   . Headache(784.0)   . Depression     Past Surgical History  Procedure Laterality Date  . No past surgeries     Family History:  Family History  Problem Relation Age of Onset  . Alcohol abuse Neg Hx   . Arthritis Neg Hx   . Asthma Neg Hx   . Birth defects Neg Hx   . Cancer Neg Hx   . COPD Neg Hx   . Depression Neg Hx   . Diabetes Neg Hx   . Drug abuse Neg Hx   . Early death Neg Hx   . Hearing loss Neg Hx   . Heart disease Neg Hx   . Hyperlipidemia Neg Hx   . Hypertension Neg Hx   . Kidney disease Neg Hx   . Learning disabilities Neg Hx   . Mental illness Neg Hx   . Mental retardation Neg Hx   . Miscarriages / Stillbirths Neg Hx   . Stroke Neg Hx   . Vision loss Neg Hx    Social History:  History  Alcohol Use No  History  Drug Use No    History   Social History  . Marital Status: Married    Spouse Name: N/A  . Number of Children: N/A  . Years of Education: N/A   Social History Main Topics  . Smoking status: Never Smoker   . Smokeless tobacco: Never Used  . Alcohol Use: No  . Drug Use: No  . Sexual Activity: Yes   Other Topics Concern  . None   Social History Narrative   Additional History:    Sleep: poor , which she attributes to headache .  Appetite:  Good   Assessment:   Musculoskeletal: Strength & Muscle Tone: within normal limits Gait & Station: normal Patient leans: N/A   Psychiatric Specialty  Exam: Physical Exam  Vitals reviewed.   Review of Systems  Constitutional: Negative for fever and chills.  HENT: Positive for congestion.   Eyes: Positive for photophobia.  Respiratory: Negative for cough and shortness of breath.   Cardiovascular: Negative for chest pain.  Gastrointestinal: Positive for nausea. Negative for vomiting.  Skin: Negative for rash.  Neurological: Positive for headaches.  Psychiatric/Behavioral: Positive for depression.    Blood pressure 103/62, pulse 83, temperature 97.9 F (36.6 C), temperature source Oral, resp. rate 18, height 5' 4.25" (1.632 m), weight 152 lb (68.947 kg), not currently breastfeeding.Body mass index is 25.89 kg/(m^2).  General Appearance: Fairly Groomed  Engineer, water::  Fair  Speech:  Normal Rate  Volume:  Decreased  Mood:  Depressed  Affect:  constricted and irritable   Thought Process:  Goal Directed and Linear  Orientation:  Other:  fully alert and attentive  Thought Content:  describes recent auditory hallucinations ( most recently yesterday) , but does not appear internally preoccupied at present, no delusions are expressed   Suicidal Thoughts:  No At this time denies any suicidal plan or intent and contracts for safety on the unit  Homicidal Thoughts:  No  Memory:  Recent and remote fair   Judgement:  Fair  Insight:  Fair  Psychomotor Activity:  Normal  Concentration:  Good  Recall:  Good  Fund of Knowledge:Good  Language: Good  Akathisia:  Negative  Handed:  Right  AIMS (if indicated):     Assets:  Desire for Improvement Resilience Social Support  ADL's:  Fair   Cognition:  Alert and attentive   Sleep:  Number of Hours: 1     Current Medications: Current Facility-Administered Medications  Medication Dose Route Frequency Provider Last Rate Last Dose  . alum & mag hydroxide-simeth (MAALOX/MYLANTA) 200-200-20 MG/5ML suspension 30 mL  30 mL Oral Q4H PRN Waylan Boga, NP      . buPROPion [facility] SR) 12 hr tablet  150 mg  150 mg Oral Daily Waylan Boga, NP   150 mg at 12/09/14 0818  . LORazepam (ATIVAN) tablet 1 mg  1 mg Oral Q8H PRN Waylan Boga, NP   1 mg at 12/09/14 1158  . magnesium hydroxide (MILK OF MAGNESIA) suspension 30 mL  30 mL Oral Daily PRN Waylan Boga, NP      . methocarbamol (ROBAXIN) tablet 750 mg  750 mg Oral TID Freda Munro May Agustin, NP   750 mg at 12/09/14 1158  . naproxen (NAPROSYN) tablet 500 mg  500 mg Oral BID WC Jenne Campus, MD   500 mg at 12/09/14 0818  . ondansetron (ZOFRAN) tablet 4 mg  4 mg Oral Q8H PRN Waylan Boga, NP   4 mg at 12/08/14 2130  . propranolol (  INDERAL) tablet 10 mg  10 mg Oral BID Janett Labella, NP   10 mg at 12/09/14 2527  . risperiDONE (RISPERDAL) tablet 2 mg  2 mg Oral QHS Sheila May Agustin, NP   2 mg at 12/08/14 2130    Lab Results: No results found for this or any previous visit (from the past 48 hour(s)).  Physical Findings: AIMS: Facial and Oral Movements Muscles of Facial Expression: None, normal Lips and Perioral Area: None, normal Jaw: None, normal Tongue: None, normal,Extremity Movements Upper (arms, wrists, hands, fingers): None, normal Lower (legs, knees, ankles, toes): None, normal, Trunk Movements Neck, shoulders, hips: None, normal, Overall Severity Severity of abnormal movements (highest score from questions above): None, normal Incapacitation due to abnormal movements: None, normal Patient's awareness of abnormal movements (rate only patient's report): No Awareness, Dental Status Current problems with teeth and/or dentures?: No Does patient usually wear dentures?: No  CIWA:    COWS:      Assessment-  Patient more dysphoric, depressed, and irritable today. Attributes this at least partly to increased headache. Seen by Neurology consult. Has signed 72 hour letter requesting discharge. Describes recent auditory halls ( unintelligible) but not at present, and denies SI at present .    Treatment Plan Summary: Daily contact  with patient to assess and evaluate symptoms and progress in treatment, Medication management, Plan continue inpatient treatment and continue medication management as below  Increase to Wellbutrin XL   300n mgrs QAM (  to address ongoing depression ) Increase Risperidone  To 3  mgrs QHS Restart  Celexa   10  mgrs QDAY ( patient seemed to be doing better insofar as mood on this SSRI)   Adjust Naprosyn  To 250 mgrs TID  for  headache  treatment Start  Topamax for headache prophylaxis.  ( Start at [facility])   Will try to contact husband to arrange for a family meeting tomorrow- patient aware and in agreement.  Medical Decision Making:  Review of Psycho-Social Stressors (1), Review or order clinical lab tests (1), Review of Medication Regimen & Side Effects (2) and Review of New Medication or Change in Dosage (2)  COBOS, FERNANDO  12/09/2014, 1:15 PM

## 2014-12-09 NOTE — Progress Notes (Signed)
Pt's interpreter was here until after 2130.  Pt did not go to group.  She spent the early part of the shift sitting in a chair next to the hall phone.  Pt reports her feet are hurting and requests socks.  She is presently wearing thin soled flip flops.  Pt is also c/o a headache which she says is ongoing.  She has an appt tomorrow to go for CT or MRI as she has been treated since her admission for the same headache without relief.  Pt is on scheduled meds.  PRN medications along with ice packs have been given this evening without relief.  Pt has been encouraged to stay in her room with the lights off to decrease tension and stimulation.  Through the interpreter, pt denies SI/HI.  She is hearing mumbling voices, but not at this time.  Pt's husband visited this evening and wanted a call from the RN.  When husband was called, he said that the pt wanted the staff to contact her MD(DR. Wandra ArthursMary Campbell-[phone]), and that she wanted something done to relieve her headache.  Writer explained that pt was to go for testing tomorrow and that the MD info would be given to day staff.  Meds were given while interpreter was on the unit.  Support and encouragement offered.  Safety maintained with q15 minute checks.

## 2014-12-09 NOTE — Progress Notes (Signed)
The patient has been awake for the past few 15 minute checks, sitting on the floor in her room. She emerged from her room a few minutes with her eyes half closed. This author asked her if she needed anything or if she would like to have some sleep medication. Patient refused.

## 2014-12-09 NOTE — Clinical Social Work Note (Signed)
CSW left voicemail for husband Ibounou Maiga [phone], awaiting return call.  Samuella BruinKristin Viktoriya Glaspy, MSW, [employer] Clinical Social Worker [facility] [phone]

## 2014-12-10 DIAGNOSIS — F333 Major depressive disorder, recurrent, severe with psychotic symptoms: Secondary | ICD-10-CM

## 2014-12-10 MED ORDER — CITALOPRAM HYDROBROMIDE 20 MG PO TABS
20.0000 mg | ORAL_TABLET | Freq: Every day | ORAL | Status: DC
  Administered 2014-12-11 – 2014-12-16 (×6): 20 mg via ORAL
  Filled 2014-12-10 (×8): qty 1

## 2014-12-10 MED ORDER — OXYCODONE-ACETAMINOPHEN 5-325 MG PO TABS
1.0000 | ORAL_TABLET | Freq: Four times a day (QID) | ORAL | Status: DC | PRN
  Administered 2014-12-10 – 2014-12-15 (×2): 1 via ORAL
  Filled 2014-12-10 (×2): qty 1

## 2014-12-10 MED ORDER — RISPERIDONE 2 MG PO TABS
2.0000 mg | ORAL_TABLET | Freq: Two times a day (BID) | ORAL | Status: DC
  Administered 2014-12-11: 2 mg via ORAL
  Filled 2014-12-10 (×7): qty 1

## 2014-12-10 NOTE — Progress Notes (Signed)
Patient ID: Catherine Gomez, female   DOB: 10/06/1991, 24 y.o.   MRN: [id number] She has been up standing in hall way or sitting in dayroom.   C/o headache and depression . Medication for headache given. Self inventory:  Poor sleep and appetite,energy is normal, depression 4,hopelessless0 and anxiety 5. She denies SI thoughts. Pain 9/10 headache chronic. Goal is to talk to doctor about headache and depression.  Interpretor with her.

## 2014-12-10 NOTE — Progress Notes (Addendum)
Patient ID: Catherine Gomez, female   DOB: 01/28/1991, 24 y.o.   MRN: [id number] [facility] Catherine Gomez Progress Note  12/10/2014 3:21 PM Catherine Gomez  MRN:  [id number] Subjective:  Patient reports feeling worse. She states she has a severe headache. She reports " I want to leave", " I am feeling terrible".   Objective: I have discussed case with treatment team. I have met with patient and we have had a family meeting ( duration 45 minutes) with her husband, who is fluent in [place], patient, Catherine Gomez, and SW. As discussed with staff, patient has bee refusing medications , and her PO intake has been poor over the last day in particular.  As noted, patient reports increased symptoms of headache over recent days, which she attributes either to medications or to unit environment. Reports she has been refusing medications because she is upset her headache is not being worked up with head imaging. States " I know I had a normal test ( CT Scan) recently but states " it is worse now, maybe something is there now that was not there before".  She is not endorsing medication side effects other than concern they might be worsening her headache. She presents tearful, sad, and cries intermittently.   During the family session She made a  statement that she was thinking of killing self if she did not discharge soon,  And that she  wants to die, but later stated she was not suicidal: denied any plan of hurting self. Patient also describes ongoing auditory hallucinations, hears " screams", but denies any command hallucinations. No overt delusions expressed, but patient did allude to not wanting to sleep in her bed due to fear that " the illness " of the prior patient who used that bed might be transmissible. Husband reported that she is far from her baseline. He provided collateral information- patient had no significant medical or psychiatric history until her youngest child was born about a year ago. 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  Patient stated to husband  that she wanted to go home, but husband expressed concern about this and felt she was not safe to leave at this time.  Both patient and husband are wanting to see if she can be transferred to [facility] inpatient psychiatric unit, where she has been hospitalized before and where she seemed to improve significantly during the admission. Social Worker has initiated communication with [employer] to determine transfer possibility. One issue that patient and husband think would likely help is to allow patient to have a room to herself , as she states it is very uncomfortable for her to practice her prayers with someone in her room.   Staff encouraged patient to comply with medications , and pointed out she seemed to be doing better when she was compliant with them.   Principal Problem: MDD, Severe, with psychotic features  Diagnosis:   Patient Active Problem List   Diagnosis Date Noted  . Major depressive disorder, single episode, severe without psychotic features [F32.2]   . 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 Suicidal ideation [R45.851] 12/04/2014  . Severe major depression without psychotic features [F32.2] 12/04/2014  . Homicidal ideation [R45.850]   . Headache [R51] 07/16/2014  . Hypersalivation [K11.7] 07/16/2014  . Psychosis [F29] 04/08/2014  . Postpartum depression [F53] 04/07/2014  .  Severe preeclampsia [O14.10] 12/18/2011  . Normal delivery [O80, Z37.9] 12/18/2011  . Urinary tract infection during pregnancy [O23.40] 05/24/2011   Total Time spent with patient: 45 minutes   Past Medical History:  Past Medical History  Diagnosis Date  . Extended spectrum beta lactamase (ESBL) resistance   . Headache(784.0)   . Depression     Past Surgical  History  Procedure Laterality Date  . No past surgeries     Family History:  Family History  Problem Relation Age of Onset  . Alcohol abuse Neg Hx   . Arthritis Neg Hx   . Asthma Neg Hx   . Birth defects Neg Hx   . Cancer Neg Hx   . COPD Neg Hx   . Depression Neg Hx   . Diabetes Neg Hx   . Drug abuse Neg Hx   . Early death Neg Hx   . Hearing loss Neg Hx   . Heart disease Neg Hx   . Hyperlipidemia Neg Hx   . Hypertension Neg Hx   . Kidney disease Neg Hx   . Learning disabilities Neg Hx   . Mental illness Neg Hx   . Mental retardation Neg Hx   . Miscarriages / Stillbirths Neg Hx   . Stroke Neg Hx   . Vision loss Neg Hx    Social History:  History  Alcohol Use No     History  Drug Use No    History   Social History  . Marital Status: Married    Spouse Name: N/A  . Number of Children: N/A  . Years of Education: N/A   Social History Main Topics  . Smoking status: Never Smoker   . Smokeless tobacco: Never Used  . Alcohol Use: No  . Drug Use: No  . Sexual Activity: Yes   Other Topics Concern  . None   Social History Narrative   Additional History:    Sleep:  Poor   Appetite:  Good   Assessment:   Musculoskeletal: Strength & Muscle Tone: within normal limits Gait & Station: normal Patient leans: N/A   Psychiatric Specialty Exam: Physical Exam  Vitals reviewed.   Review of Systems  Constitutional: Negative for fever and chills.  Respiratory: Negative for cough and shortness of breath.   Cardiovascular: Negative for chest pain.  Gastrointestinal: Negative for vomiting.  Skin: Negative for rash.  Neurological: Positive for headaches. Negative for seizures and loss of consciousness.  Psychiatric/Behavioral: Positive for depression, suicidal ideas and hallucinations.    Blood pressure 93/61, pulse 97, temperature 97.8 F (36.6 C), temperature source Oral, resp. rate 16, height 5' 4.25" (1.632 m), weight 152 lb (68.947 kg), not currently  breastfeeding.Body mass index is 25.89 kg/(m^2).  General Appearance: Fairly Groomed  Engineer, water::  Fair  Speech:  Normal Rate  Volume:  Decreased  Mood:  Depressed  Affect:  constricted and irritable - tearful at times today  Thought Process:  Goal Directed and Linear  Orientation:  Other:  fully alert and attentive  Thought Content:  ruminative about headaches, and reporting auditory hallucinations ( hearing screams).   Suicidal Thoughts:  Yes.  without intent/plan - denies any plan to hurt self  Homicidal Thoughts:  No  Memory:  Recent and remote fair   Judgement:  Impaired  Insight:  Fair  Psychomotor Activity:  Normal  Concentration:  Good  Recall:  Good  Fund of Knowledge:Good  Language: Good  Akathisia:  Negative  Handed:  Right  AIMS (if  indicated):     Assets:  Desire for Improvement Resilience Social Support  ADL's:  Fair   Cognition:  Alert and attentive   Sleep:  Number of Hours: 0     Current Medications: Current Facility-Administered Medications  Medication Dose Route Frequency Provider Last Rate Last Dose  . alum & mag hydroxide-simeth (MAALOX/MYLANTA) 200-200-20 MG/5ML suspension 30 mL  30 mL Oral Q4H PRN Catherine Boga, Catherine Gomez      . buPROPion (WELLBUTRIN XL) 24 hr tablet 300 mg  300 mg Oral Daily Catherine Campus, Catherine Gomez   300 mg at 12/10/14 0819  . citalopram (CELEXA) tablet 10 mg  10 mg Oral Daily Catherine Campus, Catherine Gomez   10 mg at 12/10/14 0820  . LORazepam (ATIVAN) tablet 1 mg  1 mg Oral Q8H PRN Catherine Boga, Catherine Gomez   1 mg at 12/10/14 0231  . magnesium hydroxide (MILK OF MAGNESIA) suspension 30 mL  30 mL Oral Daily PRN Catherine Boga, Catherine Gomez      . methocarbamol (ROBAXIN) tablet 750 mg  750 mg Oral TID Catherine Munro May Agustin, Catherine Gomez   750 mg at 12/10/14 1583  . naproxen (NAPROSYN) tablet 250 mg  250 mg Oral TID WC Catherine Campus, Catherine Gomez   250 mg at 12/10/14 0610  . ondansetron (ZOFRAN) tablet 4 mg  4 mg Oral Q8H PRN Catherine Boga, Catherine Gomez   4 mg at 12/08/14 2130  . propranolol (INDERAL)  tablet 10 mg  10 mg Oral BID Catherine Labella, Catherine Gomez   10 mg at 12/10/14 0940  . risperiDONE (RISPERDAL) tablet 3 mg  3 mg Oral QHS Catherine Campus, Catherine Gomez   3 mg at 12/09/14 2121  . topiramate (TOPAMAX) tablet 25 mg  25 mg Oral Daily Catherine Campus, Catherine Gomez   25 mg at 12/10/14 0820    Lab Results: No results found for this or any previous visit (from the past 48 hour(s)).  Physical Findings: AIMS: Facial and Oral Movements Muscles of Facial Expression: None, normal Lips and Perioral Area: None, normal Jaw: None, normal Tongue: None, normal,Extremity Movements Upper (arms, wrists, hands, fingers): None, normal Lower (legs, knees, ankles, toes): None, normal, Trunk Movements Neck, shoulders, hips: None, normal, Overall Severity Severity of abnormal movements (highest score from questions above): None, normal Incapacitation due to abnormal movements: None, normal Patient's awareness of abnormal movements (rate only patient's report): No Awareness, Dental Status Current problems with teeth and/or dentures?: No Does patient usually wear dentures?: No  CIWA:    COWS:      Assessment-    Patient has worsened over the last day or two. She is now more depressed, tearful, reporting increased hallucinations, and more ruminative about her headaches. She has been eating very little since yesterday and is sleeping very poorly, possibly due to delusion that by using her bed she might get " infected" with disease from prior patient who used bed. Husband confirms that patient is far from baseline. Family meeting was helpful in obtaining further collateral information, and patient did agree to take medications and to resume PO intake, but both patient and husband are wanting  Repeat Head MRI .    Treatment Plan Summary: Daily contact with patient to assess and evaluate symptoms and progress in treatment, Medication management, Plan continue inpatient treatment and continue medication management as below    Increase Risperidone to 2 mgrs BID D/C Wellubtrin XL , which may be contributing to insomnia and anxiety Celexa 20 mgrs QDAY  Topamax 25 mgrs QDAY  Will initiate commitment process ( patient is currently voluntary status, and has submitted 72 hour letter , but is in need of ongoing inpatient psychiatric treatment for safety)   Will order 1: 1 observation at this time Will request follow up Head MRI, due to worsening headaches and patient's /family's increasing concern about some progression of CNS issue causing headaches . Percocet 5/325 mgr Q 6 hours PRN severe headache. Treatment team/ SW looking into possible transfer to inpatient [facility] at [facility], as per patient's request .   Medical Decision Making:  Review of Psycho-Social Stressors (1), Review or order clinical lab tests (1), Review of Medication Regimen & Side Effects (2) and Review of New Medication or Change in Dosage (2)  Zhana Jeangilles  12/10/2014, 3:21 PM

## 2014-12-10 NOTE — Clinical Social Work Note (Signed)
CSW left voicemail for Kerry DoryLaurie Gardner with [facility] regarding bed availability and making a referral. CSW awaiting return call.  Samuella BruinKristin Norrin Shreffler, MSW, [employer] Clinical Social Worker [facility] [phone]

## 2014-12-10 NOTE — Progress Notes (Signed)
[facility] Group Notes:  (Nursing/MHT/Case Management/Adjunct)  Date:  12/10/2014  Time:  10:21 AM  Type of Therapy:  Nurse Education  Participation Level:  Minimal  Participation Quality:  Drowsy  Affect:  Blunted  Cognitive:  Lacking  Insight:  Improving  Engagement in Group:  Lacking  Modes of Intervention:  Clarification and Discussion  Summary of Progress/Problems:The purpose of this group is to explore signs and symptoms of anxiety/depression and coping skills.  Beatrix ShipperWright, Brylee Berk Martin 12/10/2014, 10:21 AM

## 2014-12-10 NOTE — Progress Notes (Signed)
Patient ID: Catherine Gomez, female   DOB: 07/01/1991, 24 y.o.   MRN: [id number]  [facility] at  11 am.

## 2014-12-10 NOTE — Progress Notes (Signed)
Early in the shift, pt reported she was doing some better and that her headache pain had decreased.  Conversation was with the interpreter present.  Pt denies SI/HI/AV.  Pt states [facility].  Even though her headache is better, she feels that it has not been addressed as it should, even though neurology was consulted today and Topamax was added to her medications.  Pt was reminded that it takes a little time for some medications to work and Clinical research associatewriter encouraged her to sit down or lie down to relax.  She has been up all shift, mostly on her feet.  Pt also reminded that she did not sleep much last night or today and that could cause her head to hurt.  Support and encouragement offered.  Pt has been going to groups with the aid of an interpreter present.  Pt voices no other needs or concerns.  Safety maintained with q15 minute checks.

## 2014-12-10 NOTE — Tx Team (Signed)
Interdisciplinary Treatment Plan Update (Adult) Date: 12/10/2014   Time Reviewed: 9:30 AM  Progress in Treatment: Attending groups: Yes Participating in groups: Minimally Taking medication as prescribed: Yes Tolerating medication: Yes Family/Significant other contact made: Yes, CSW has spoken with patient's husband Patient understands diagnosis: Yes Discussing patient identified problems/goals with staff: Yes Medical problems stabilized or resolved: Yes Denies suicidal/homicidal ideation: Yes Issues/concerns per patient self-inventory: Yes Other:  New problem(s) identified: N/A  Discharge Plan or Barriers:   2/19: Patient plans to return home to continue services with ACTT team at discharge.  2/23: Patient reports lack of improvement in symptoms with worsening headache. CSW spoke with husband who is agreeable to family meeting today at 2:30. Patient and husband are [facility].   Reason for Continuation of Hospitalization:  Depression Anxiety Medication Stabilization   Comments: N/A  Estimated length of stay: 1-2 days  For review of initial/current patient goals, please see plan of care.  Patient is a 24 year old African female admitted with increasing depression and headaches. [place] her husband and 2 children. She currently receives services with an ACTT team. Patient will benefit from crisis stabilization, medication evaluation, group therapy, and psycho education in addition to case management for discharge planning. Patient and CSW reviewed pt's identified goals and treatment plan. Pt verbalized understanding and agreed to treatment plan.   Attendees: Patient:    Family:    Physician: Dr. Jama Flavorsobos; Dr. Dub MikesLugo 12/10/2014 9:30 AM  Nursing: Joslyn Devonaroline Beaudry, [place] Minersonna Shimp, RN 12/10/2014 9:30 AM  Clinical Social Worker: Samuella BruinKristin Sakib Noguez,  LCSWA 12/10/2014 9:30 AM  Other: Juline PatchQuylle Hodnett, LCSW  12/10/2014 9:30 AM  Other: Leisa LenzValerie Enoch, Vesta MixerMonarch Liaison 12/10/2014 9:30 AM  Other: Onnie BoerJennifer Clark, Case Manager 12/10/2014 9:30 AM  Other: Mosetta AnisAggie Nwoko, Laura Davis  NP 12/10/2014 9:30 AM  Other:    Other:                          Scribe for Treatment Team:  Samuella BruinKristin Nygel Prokop, MSW, [employer]CSWA [phone]

## 2014-12-10 NOTE — Clinical Social Work Note (Signed)
Family meeting with husband scheduled at 2:30 pm. MD aware. CSW has spoken with Fritzi MandesKirsten from language resources regarding need for interpreter at that time.  Samuella BruinKristin Porcha Deblanc, MSW, [employer]CS[employer] Clinical Social Worker [facility] [phone]

## 2014-12-10 NOTE — Progress Notes (Signed)
Pt attended spiritual care group on grief and loss facilitated by chaplain Burnis KingfisherMatthew Zyere Jiminez and Counseling intern [place] Austin. Group opened with brief discussion and psycho-social ed around grief and loss in relationships and in relation to self - identifying life patterns, circumstances, changes that cause losses. Established group norm of speaking from own life experience. Group goal of establishing open and affirming space for members to share loss and experience with grief, normalize grief experience and provide psycho social education and grief support.  Group drew on narrative and Alderian therapeutic modalities.    Bellarose was present at group introductions.  She left group and returned at end.  While present in group, Jermiyah appeared to have suppressed, flat affect and seemed tired with eyes closed.    Belva CromeStalnaker, Donevin Sainsbury Wayne MDiv

## 2014-12-10 NOTE — Clinical Social Work Note (Signed)
[facility]'s [facility], referral was sent to Kerry DoryLaurie Gardner.  Samuella BruinKristin Manu Rubey, MSW, [employer] Clinical Social Worker [facility] [phone]

## 2014-12-10 NOTE — [facility] Group Notes (Signed)
[facility] LCSW Group Therapy  12/10/2014   1:15 PM   Type of Therapy:  Group Therapy  Participation Level:  Minimal  Participation Quality:  Attentive  Affect:  Depressed and Flat  Cognitive:  Alert and Oriented  Insight:  Developing/Improving and Engaged  Engagement in Therapy:  Developing/Improving and Engaged  Modes of Intervention:  Clarification, Confrontation, Discussion, Education, Exploration, Limit-setting, Orientation, Problem-solving, Rapport Building, Dance movement psychotherapisteality Testing, Socialization and Support  Summary of Progress/Problems: The topic for group therapy was feelings about diagnosis.  Pt actively participated in group discussion on their past and current diagnosis and how they feel towards this.  Pt also identified how society and family members judge them, based on their diagnosis as well as stereotypes and stigmas.  Patient states that she continues to feel worse during hospitalization because of continuing headaches. She states that she is unsure if she should go home but does not want to remain in the hospital. CSW provided emotional support to patient.   Samuella BruinKristin Marico Buckle, MSW, [employer] Clinical Social Worker [facility] [phone]

## 2014-12-10 NOTE — Progress Notes (Signed)
Patient ID: Catherine Gomez, female   DOB: 01/30/1991, 24 y.o.   MRN: [id number] 1:1 notes  12/10/2014 @ 2100  D: Patient sitting in dayroom with sitter and interpreter. Pt refuse to go to her room because she afraid of "the spirits in her room". Pt is reports been cold but refuse to use hospital blanket. Pt report pain as 10 as of 0-10 scale. Pt served with IVC papers by sheriff.    A: 1:1 observation for safety. IVC papers explained to pt. Medication administered as prescribed. Emotional support given to try to rest.  R: Patient is safe in the dayroom. Sitter at bedside. 1:1 continues. Pt called husband to bring blanket from home. Pt denies SI/HI. Pt endorses AH seeing spirits but denies command hallucination.

## 2014-12-10 NOTE — Progress Notes (Signed)
Nursing note 1-1: She has been in day room after family meeting did eat a few bights of food. C/O headache and then when she came too medication window she refused saying later. Said she did not want medication if it made her sleepy.

## 2014-12-10 NOTE — Clinical Social Work Note (Signed)
Per Jack[facility]aur[facility] at [facility], referral has been received and will be reviewed by physician tomorrow. Bed availability unknown at this time. CSW will continue to follow.  Samuella BruinKristin Caitlen Worth, MSW, [employer] Clinical Social Worker [facility] [phone]

## 2014-12-10 NOTE — [facility] Group Notes (Signed)
The focus of this group is to educate the patient on the purpose and policies of crisis stabilization and provide a format to answer questions about their admission.  The group details unit policies and expectations of patients while admitted.  Patient attended 0900 nurse education orientation group this morning.  Patient actively participated, appropriate affect, alert, appropriate insight and engagement.  Today patient will work on 3 goals for discharge.  

## 2014-12-11 ENCOUNTER — Ambulatory Visit (HOSPITAL_COMMUNITY)
Admit: 2014-12-11 | Discharge: 2014-12-11 | Disposition: A | Discharge status: Home / Self Care | Payer: Medicaid Other | Source: Ambulatory Visit | Attending: Psychiatry | Admitting: Psychiatry

## 2014-12-11 DIAGNOSIS — R51 Headache: Secondary | ICD-10-CM | POA: Insufficient documentation

## 2014-12-11 DIAGNOSIS — R4182 Altered mental status, unspecified: Secondary | ICD-10-CM | POA: Insufficient documentation

## 2014-12-11 DIAGNOSIS — R410 Disorientation, unspecified: Secondary | ICD-10-CM | POA: Insufficient documentation

## 2014-12-11 MED ORDER — RISPERIDONE 3 MG PO TABS
3.0000 mg | ORAL_TABLET | Freq: Every day | ORAL | Status: DC
  Administered 2014-12-11 – 2014-12-12 (×2): 3 mg via ORAL
  Filled 2014-12-11 (×4): qty 1

## 2014-12-11 MED ORDER — RISPERIDONE 2 MG PO TABS
2.0000 mg | ORAL_TABLET | Freq: Every day | ORAL | Status: DC
  Administered 2014-12-12 – 2014-12-13 (×2): 2 mg via ORAL
  Filled 2014-12-11 (×4): qty 1

## 2014-12-11 MED ORDER — TOPIRAMATE 25 MG PO TABS
25.0000 mg | ORAL_TABLET | Freq: Two times a day (BID) | ORAL | Status: DC
  Administered 2014-12-12 – 2014-12-16 (×9): 25 mg via ORAL
  Filled 2014-12-11 (×12): qty 1

## 2014-12-11 NOTE — Progress Notes (Signed)
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 I explained that if she comes and is claustro we would have [facility]. RN agreed.    10:00am    bhj

## 2014-12-11 NOTE — Progress Notes (Addendum)
Patient has been sitting in the dayroom with eyes closed. Nodding off at intervals however refuses to lie down. Will not indicate why she won't. BP obtained and pulse remains elevated upon standing. She continues to be resistant to taking meds however took ativan prn. She would not take her 1700 meds even with much encouragement. "I don't want to be sleepy." Denying headache at one point however with prompting admits it is hurting. Does not wish to have anything for pain and indicates it "only hurts a little bit." Patient remains on 1:1 for safety and she is safe. Lawrence MarseillesFriedman, Chavie Kolinski Eakes

## 2014-12-11 NOTE — Progress Notes (Addendum)
Patient ID: Catherine Gomez, female   DOB: 02-08-1991, 24 y.o.   MRN: [id number] [facility] MD Progress Note  12/11/2014 2:18 PM Catherine Gomez  MRN:  [id number] Subjective:   Patient reports some improvement. She states her headache is better, although today reports new onset neck discomfort. She states she is not having any side effects from medications at present.   Objective: I have discussed case with treatment team. I met with patient with assistance from translator.  As discussed with staff, patient has reported some improvement and has started to eat , although PO intake still fair ( had stopped eating for 1-2 days prior). She has also been more compliant with medications, although with encouragement from staff. Staff reports some disorganized behavior and decreased ADLs, hygiene, but is becoming more redirectable by staff. As per nursing staff in very early AM she was noted to be praying loudly in hallway.  Today patient states she hears some " voices" but cannot make out what they say. She does not appear internally preoccupied at the present time. She states she is no longer hearing the screams she had reported yesterday. Overall , patient presents with some improvement - today presents with better eye contact, smiles at times appropriately and seems better related overall, and is reporting decreased hallucinations. As noted, patient  And husband had expressed concern that headache has worsened even over recent weeks- for this reason she had a follow up Head MRI today, which is reported as negative . Sleep remains poor, although report from staff is that she did sleep better after her husband brought her her own blanket from home and by not having a roommate . Transcripts from prior admission at [facility] have arrived and indicate a similar presentation at the time. Neuro work up done at the time reported as negative. Patient and husband had expressed desire to transfer her care to [facility] inpatient psychiatric unit yesterday. Today patient seems less concerned about this and states " I don't care if I go there, I just want to go home soon".  SW is looking into transfer possibility.   Principal Problem: MDD, Severe, with psychotic features  Diagnosis:   Patient Active Problem List   Diagnosis Date Noted  . Major depressive disorder, single episode, severe without psychotic features [F32.2]   . 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 Suicidal ideation [R45.851] 12/04/2014  . Severe major depression without psychotic features [F32.2] 12/04/2014  . Homicidal ideation [R45.850]   . Headache [R51] 07/16/2014  . Hypersalivation [K11.7] 07/16/2014  . Psychosis [F29] 04/08/2014  . Postpartum depression [F53] 04/07/2014  . Severe preeclampsia [O14.10] 12/18/2011  . Normal delivery [O80, Z37.9] 12/18/2011  . Urinary tract infection during pregnancy [O23.40] 05/24/2011   Total Time spent with patient: 25 minutes    Past Medical History:  Past Medical History  Diagnosis Date  . Extended spectrum beta lactamase (ESBL) resistance   . Headache(784.0)   . Depression     Past Surgical History  Procedure Laterality Date  . No past surgeries     Family History:  Family History  Problem Relation Age of Onset  . Alcohol abuse Neg Hx   . Arthritis Neg Hx   . Asthma Neg Hx   . Birth defects Neg Hx   . Cancer Neg Hx   . COPD Neg Hx   . Depression Neg Hx   . Diabetes Neg Hx   . Drug abuse Neg Hx   . Early  death Neg Hx   . Hearing loss Neg Hx   . Heart disease Neg Hx   . Hyperlipidemia Neg Hx   . Hypertension Neg Hx   . Kidney disease Neg Hx   . Learning disabilities Neg Hx   . Mental illness Neg Hx   . Mental retardation Neg Hx   . Miscarriages / Stillbirths Neg Hx   . Stroke Neg Hx   . Vision loss Neg Hx    Social History:  History  Alcohol Use No     History  Drug Use No    History   Social History  . Marital Status: Married     Spouse Name: N/A  . Number of Children: N/A  . Years of Education: N/A   Social History Main Topics  . Smoking status: Never Smoker   . Smokeless tobacco: Never Used  . Alcohol Use: No  . Drug Use: No  . Sexual Activity: Yes   Other Topics Concern  . None   Social History Narrative   Additional History:    Sleep:  Poor   Appetite:  Good   Assessment:   Musculoskeletal: Strength & Muscle Tone: within normal limits Gait & Station: normal Patient leans: N/A   Psychiatric Specialty Exam: Physical Exam  Vitals reviewed.   ROS  Blood pressure 119/87, pulse 146, temperature 97.6 F (36.4 C), temperature source Oral, resp. rate 20, height 5' 4.25" (1.632 m), weight 152 lb (68.947 kg), not currently breastfeeding.Body mass index is 25.89 kg/(m^2).  General Appearance: Fairly Groomed  Engineer, water::  improving   Speech:  Normal Rate  Volume:  Decreased  Mood:  less depressed today  Affect:  still constricted but more reactive , smiles briefly at times, appropriately-  Thought Process:  Goal Directed and Linear  Orientation:  Other:  fully alert and attentive  Thought Content:  less ruminative about headaches, but still somatically focused. (+) hallucinations described as above, but does not currently appear internally preoccupied  Suicidal Thoughts:  No - denies any plan to hurt self at this time  Homicidal Thoughts:  No  Memory:  Recent and remote fair   Judgement:  Fair  Insight:  Fair  Psychomotor Activity:  Normal- no psychomotor agitation at this time  Concentration:  Good  Recall:  Good  Fund of Knowledge:Good  Language: Good  Akathisia:  Negative  Handed:  Right  AIMS (if indicated):     Assets:  Desire for Improvement Resilience Social Support  ADL's:  Fair   Cognition:  Alert and attentive   Sleep:  Number of Hours: 1.5     Current Medications: Current Facility-Administered Medications  Medication Dose Route Frequency Provider Last Rate Last  Dose  . alum & mag hydroxide-simeth (MAALOX/MYLANTA) 200-200-20 MG/5ML suspension 30 mL  30 mL Oral Q4H PRN Waylan Boga, NP      . citalopram (CELEXA) tablet 20 mg  20 mg Oral Daily Jenne Campus, MD   20 mg at 12/11/14 0850  . LORazepam (ATIVAN) tablet 1 mg  1 mg Oral Q8H PRN Waylan Boga, NP   1 mg at 12/11/14 9417  . magnesium hydroxide (MILK OF MAGNESIA) suspension 30 mL  30 mL Oral Daily PRN Waylan Boga, NP      . ondansetron [facility]) tablet 4 mg  4 mg Oral Q8H PRN Waylan Boga, NP   4 mg at 12/08/14 2130  . oxyCODONE-acetaminophen (PERCOCET/ROXICET) 5-325 MG per tablet 1 tablet  1 tablet Oral Q6H PRN Felicita Gage  A Kelbie Moro, MD   1 tablet at 12/10/14 2007  . propranolol (INDERAL) tablet 10 mg  10 mg Oral BID Janett Labella, NP   10 mg at 12/11/14 0850  . risperiDONE (RISPERDAL) tablet 2 mg  2 mg Oral BID Jenne Campus, MD   2 mg at 12/11/14 0850  . topiramate (TOPAMAX) tablet 25 mg  25 mg Oral Daily Jenne Campus, MD   25 mg at 12/11/14 3662    Lab Results: No results found for this or any previous visit (from the past 48 hour(s)).  Physical Findings: AIMS: Facial and Oral Movements Muscles of Facial Expression: None, normal Lips and Perioral Area: None, normal Jaw: None, normal Tongue: None, normal,Extremity Movements Upper (arms, wrists, hands, fingers): None, normal Lower (legs, knees, ankles, toes): None, normal, Trunk Movements Neck, shoulders, hips: None, normal, Overall Severity Severity of abnormal movements (highest score from questions above): None, normal Incapacitation due to abnormal movements: None, normal Patient's awareness of abnormal movements (rate only patient's report): No Awareness, Dental Status Current problems with teeth and/or dentures?: No Does patient usually wear dentures?: No  CIWA:    COWS:      Assessment-   Patient starting to improve compared to yesterday, and presenting with slightly improved eye contact, range of affect, and is less  focused non headache, which she states is better today. She does remain disorganized and psychotic, although auditory hallucinations are reported as less severe than yesterday. Tolerating medications well.   Treatment Plan Summary: Daily contact with patient to assess and evaluate symptoms and progress in treatment, Medication management, Plan continue inpatient treatment and continue medication management as below   Increase Risperidone to 2 mgrs  QAM and 3 mgrs QHS Celexa 20 mgrs QDAY  Increase Topamax to  25 mgrs  BID to help with headache prophylaxis Ativan 1 mgr Q 8 hours for severe anxiety or agitation Percocet 5/325 mgr Q 6 hours PRN severe headache. ( I think opiate management is warranted at this time due to the severity of her headaches)  Treatment team/ SW looking into possible transfer to inpatient unit at [facility], as per patient's request .  Due to ongoing severity of symptoms and risk of self harm , would continue 1: 1 observation at this present time.  Medical Decision Making:  Review of Psycho-Social Stressors (1), Review or order clinical lab tests (1), Review of Medication Regimen & Side Effects (2) and Review of New Medication or Change in Dosage (2)  Catherine Gomez  12/11/2014, 2:18 PM

## 2014-12-11 NOTE — Progress Notes (Signed)
Patient ID: Catherine Gomez, female   DOB: 11/16/1990, 24 y.o.   MRN: [id number] 1:1 notes  12/11/2014 @ 0100  D: Patient in bed sleeping. Respiration regular and unlabored. No sign of distress noted at this time  A: 1:1 observation for safety  R: Patient remains asleep. Sitter at bedside. 1:1 continues

## 2014-12-11 NOTE — Progress Notes (Signed)
1:1 Observation note: Pt observed sitting in the dayroom with sitter present. Pt interpreter not present at this time. Pt stated that the medications are making her sleepy. Writer encouraged pt to go to her room and rest. Pt refused, stating that she wanted to stay in dayroom. Pt safety maintained at this time and pt remains on 1:1 observation until d/c'd.

## 2014-12-11 NOTE — [facility] Group Notes (Signed)
   [facility]  12/11/2014  8:45 AM   Participation Quality: Alert, Appropriate and Oriented  Mood/Affect: Depressed and Flat  Depression Rating: Unable to rate  Anxiety Rating: Unable to rate  Thoughts of Suicide: Pt denies SI/HI  Will you contract for safety? Yes  Current AVH: Pt denies  Plan for Discharge/Comments: Pt attended discharge planning group and actively participated in group. CSW provided pt with today's workbook. Patient reports that her hear is beating fast and that she is feeling a little better today but continues to experience headaches. Denies SI. She has no further questions at this time.  Transportation Means: Pt reports access to transportation  Supports: husband has been identified as supportive  Samuella BruinKristin Kenneth Cuaresma, MSW, [employer]CSWA Clinical Social Worker [facility] [phone]

## 2014-12-11 NOTE — Progress Notes (Signed)
Patient ID: Catherine Gomez, female   DOB: 06/04/1991, 24 y.o.   MRN: [id number] 1:1 notes  12/11/2014 @ 2100  D: Patient in dayroom sleeping with sitter and interpreter at her side. Pt refuse to go to her room and rest stating it is too early to sleep. Pt denies headache. No sign of distress noted at this time  A: 1:1 observation for safety. Pt encouraged to rest and not fight sleep. Scheduled medication administered as prescribed.  R: Patient is asleep. Sitter at bedside. 1:1 continues

## 2014-12-11 NOTE — Progress Notes (Signed)
1:1 Observation note: Pt observed sitting in the group room with sitter and interpreter present. Pt appears calm and cooperative at this time. Pt do not appear to be in distress. During medication administration this morning at 0850, Pt was resistant to taking medications and required prompting by writer to take meds. Pt stated that the meds do not appear to be working via Nurse, learning disabilitytranslator. After explaining to pt what her scheduled meds were, pt agreed to take all meds. Writer then explained to pt [facility]. Writer asked pt if she is afraid to be in closed or tight spaces. Pt then asked via translator, is she is going to die. Writer explained to pt that the procedure is done to rule out any abnormalities that could be causing her to have headaches. Pt then agreed to be okay enough to have procedure done. Pt will remain on 1:1 until d/c'd for safety.

## 2014-12-11 NOTE — Progress Notes (Signed)
Patient ID: Catherine Gomez, female   DOB: 07/08/1991, 24 y.o.   MRN: [id number] 1:1 notes  12/11/2014 @ 0500  D: Patient up praying. singing, crying loudly waking up other pt on the hall. Pt encouraged to keep voice low. Pt started menstrual cycle, used rolled up toilet tissue, used it and threw it on the floor. Pt refuse to shower or use sanitary pad. Pt walked to bathroom many time but will not shower. Pt refused PRN medication for anxiety. Pt is ritualistic will not shower but using paper towel to clean herself. No sign of distress noted at this time  A: 1:1 observation for safety. Pt needs constant redirection. Pt encouraged to shower and put clean clothing on.  R: Patient remains asleep. Sitter at bedside. 1:1 continues

## 2014-12-12 MED ORDER — SALINE SPRAY 0.65 % NA SOLN
1.0000 | NASAL | Status: DC | PRN
  Administered 2014-12-14 – 2014-12-15 (×2): 1 via NASAL
  Filled 2014-12-12: qty 44

## 2014-12-12 NOTE — Progress Notes (Signed)
Patient ID: Catherine Gomez, female   DOB: 02/23/1991, 24 y.o.   MRN: [id number] 1:1 notes  12/12/2014 @ 0500  D: Patient up praying and singing. Pt denies pain or needs. A: 1:1 observation for safety R: Patient is awake praying. Sitter at bedside. 1:1 continues

## 2014-12-12 NOTE — Progress Notes (Signed)
D)  Has been in bed resting this evening.  Had c/o anxiety after husband left and was medicated with ativan, slept . Has been lying flat on her bed, wrapped in a soft gray cover from home. Lies very still, very little verbal response.  Has voiced no other c/o's this evening, seems to understand some English, affect is flat, sad, poor eye contact.   A)  Remains on 1:1 obs for safety with mht at bedside.   R)  Safety maintained.

## 2014-12-12 NOTE — Progress Notes (Signed)
Patient ID: Nicolasa DuckingRahinatou Austria, female   DOB: 05/23/1991, 24 y.o.   MRN: [id number] Nursing note  1-1 : she has been in room sitting on bed, she will answer questions when asked in a soft low voice little eye contact. She has been encouraged to eat  Am meal, says that she will but has not. She did take her medications then spit then out in the restroom. When she was asked about it she denied that she had spit her medication out.  She will not let you touch her, went to turn ID band to scan and she pulled arm away. 1-1 continues for safety.

## 2014-12-12 NOTE — Progress Notes (Signed)
Adult Psychoeducational Group Note  Date:  12/12/2014 Time:  1:43 PM  Group Topic/Focus:  Overcoming Stress:   The focus of this group is to define stress and help patients assess their triggers.  Participation Level:  Active  Participation Quality:  Appropriate and Attentive  Affect:  Appropriate  Cognitive:  Appropriate  Insight: Appropriate  Engagement in Group:  Engaged  Modes of Intervention:  Discussion  Additional Comments:  Pt attended the group and remained appropriate and engaged throughout the duration of the group. The patient appeared to be vested and interested throughout the course of the group. Pt shared that something that relaxes her was watching tv. Pt was quiet throughout the group and only shared once.  Sheran Lawlesseese, Zahria Ding O 12/12/2014, 1:43 PM

## 2014-12-12 NOTE — Progress Notes (Signed)
Recreation Therapy Notes  Animal-Assisted Activity/Therapy (AAA/T) Program Checklist/Progress Notes Patient Eligibility Criteria Checklist & Daily Group note for Rec Tx Intervention  Date:  02.25.2016 Time: 2:45pm Location: [address] Dayroom    AAA/T Program Assumption of Risk Form signed by Patient/ or Parent Legal Guardian yes  Patient is free of allergies or sever asthma yes  Patient reports no fear of animals yes  Patient reports no history of cruelty to animals yes  Patient understands his/her participation is voluntary yes  Behavioral Response: Did not attend.   Tadeusz Stahl L Pallavi Clifton, LRT/CTRS  Christalyn Goertz L 12/12/2014 4:25 PM 

## 2014-12-12 NOTE — Progress Notes (Signed)
Stayed in room on 1 to 1

## 2014-12-12 NOTE — Progress Notes (Signed)
Patient ID: Catherine Gomez, female   DOB: 08/29/1991, 24 y.o.   MRN: [id number] 1:1 notes  12/12/2014 @ 0100  D: Patient in bed sleeping. Respiration regular and unlabored. No sign of distress noted at this time A: 1:1 observation for safety R: Patient is asleep. Sitter at bedside. 1:1 continues

## 2014-12-12 NOTE — Progress Notes (Signed)
Patient ID: Catherine Gomez, female   DOB: 04/13/1991, 24 y.o.   MRN: [id number] Nursing 1-1 note: She has been in and out of bed when in bed she has been asleep. Affect continues to be flat sad, speech low will answer questions. Liquids have been encouraged has refused liquids and has only eat less then 1/2 of meals.   1-1 continues.

## 2014-12-12 NOTE — B[facility] Group Notes (Signed)
[facility] LCSW Group Therapy  [facility] 1:15 - 2:30 PM  12/12/2014   Type of Therapy:  Group Therapy  Participation Level: Minimal  Participation Quality:  Minimal  Affect:  Flat, Depressed  Cognitive:  Appropriate  Insight:  Developing/Improving   Engagement in Therapy:  Developing/Improving   Modes of Intervention:  Discussion, Education, Exploration, Problem-Solving, Rapport Building, Support   Summary of Progress/Problems:   Patient was called out of session prior to group ending.  Catherine Gomez, Catherine Gomez 12/12/2014

## 2014-12-12 NOTE — Progress Notes (Signed)
Patient ID: Catherine Gomez, female   DOB: 04/06/1991, 24 y.o.   MRN: [id number] Self inventory this AM: Depression 8 , hopelessness 7, anxiety 5,depression 5. Denies Si thoughts physical pain head 9 will request prn then say her h/a is gone. Goal "oh I am going or tranfering her to another hospital. I want to talk to the doctor. By taking my medications.

## 2014-12-12 NOTE — B[facility] Group Notes (Signed)
LATE ENTRY FROM 2/24:  [facility] LCSW Group Therapy 12/11/14  1:15 PM Type of Therapy: Group Therapy Participation Level: Minimal  Participation Quality: Minimal  Affect: Depressed and Flat  Cognitive: Alert and Oriented  Insight: Developing/Improving and Engaged  Engagement in Therapy: Developing/Improving and Engaged  Modes of Intervention: Clarification, Confrontation, Discussion, Education, Exploration, Limit-setting, Orientation, Problem-solving, Rapport Building, Dance movement psychotherapisteality Testing, Socialization and Support  Summary of Progress/Problems: The topic for group today was emotional regulation. This group focused on both positive and negative emotion identification and allowed group members to process ways to identify feelings, regulate negative emotions, and find healthy ways to manage internal/external emotions. Group members were asked to reflect on a time when their reaction to an emotion led to a negative outcome and explored how alternative responses using emotion regulation would have benefited them. Group members were also asked to discuss a time when emotion regulation was utilized when a negative emotion was experienced. Patient actively listened during group but did not participate in discussion.  Samuella BruinKristin [employer], MSW, [employer]CSWA Clinical Social Worker [facility] [phone]

## 2014-12-12 NOTE — Progress Notes (Addendum)
Patient ID: Catherine Gomez, female   DOB: 1991/04/17, 24 y.o.   MRN: [id number] [facility] MD Progress Note  12/12/2014 5:44 PM Catherine Gomez  MRN:  [id number] Subjective:  Patient states that she still wants to try to transfer to [facility] . She states she is " about the same". She continues to report headache, which she states is better than it had been a few days ago, but worse than when she came in to hospital. She also reports a series of other somatic concerns, to include neck pain, ankle feeling " hot", " trembling all the time".   Objective: I have discussed case with treatment team. I met with patient with assistance from translator.  Modest but noticeable improvement compared to yesterday.  Her eye contact, her overall relatedness, her speech has improved, and is now more verbal, as compared to prior one word or monosyllable answers. Denies medication side effects- Nursing staff reports that patient has  spit out medications at times , but patient adamantly denies and states she resents this " because I am not spitting out anything". Nursing reports she seems guarded, and refuses at times to present her wrist /ID wrist band to staff. She continues to report high depression and a sense of hopelessness. At this time denies active SI, and denies hallucinations.  Sleep is erratic, and she is up from bed often. She continues to be somatically focused  And as noted, today reports headache as well as other physical symptoms. No visible ankle edema or discoloration, and no noticeable tremors or restlessness. As discussed with SW, [facility] has acknowledged receipt of patient's information and will inform SW of decision based on bed availability.  With patient's express consent I have again spoken with husband ( Mr. Magda Bernheim)- he states she is " about the same". He confirms he is taking care of their young children and that children are " doing well". Of note, he confirms  that patient received about 7 ECT treatments at [facility] several weeks ago, and states " for a little while after that she was completely normal, back to herself, good with the children, smiling".  I have reviewed this with patient and with husband and they both agree to another course of ECT if possible- they are aware that ECT not offered here at [facility] but may be done at [facility] or at [place] if [facility] referral falls through. Patient and husband aware and agree.   Principal Problem: MDD, Severe, with psychotic features  Diagnosis:   Patient Active Problem List   Diagnosis Date Noted  . Major depressive disorder, single episode, severe without psychotic features [F32.2]   . 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 Suicidal ideation [R45.851] 12/04/2014  . Severe major depression without psychotic features [F32.2] 12/04/2014  . Homicidal ideation [R45.850]   . Headache [R51] 07/16/2014  . Hypersalivation [K11.7] 07/16/2014  . Psychosis [F29] 04/08/2014  . Postpartum depression [F53] 04/07/2014  . Severe preeclampsia [O14.10] 12/18/2011  . Normal delivery [O80, Z37.9] 12/18/2011  . Urinary tract infection during pregnancy [O23.40] 05/24/2011   Total Time spent with patient: 30 minutes   Past Medical History:  Past Medical History  Diagnosis Date  . Extended spectrum beta lactamase (ESBL) resistance   . Headache(784.0)   . Depression     Past Surgical History  Procedure Laterality Date  . No past surgeries     Family History:  Family History  Problem Relation Age of Onset  .  Alcohol abuse Neg Hx   . Arthritis Neg Hx   . Asthma Neg Hx   . Birth defects Neg Hx   . Cancer Neg Hx   . COPD Neg Hx   . Depression Neg Hx   . Diabetes Neg Hx   . Drug abuse Neg Hx   . Early death Neg Hx   . Hearing loss Neg Hx   . Heart disease Neg Hx   . Hyperlipidemia Neg Hx   . Hypertension Neg Hx   . Kidney disease Neg Hx   . Learning disabilities Neg  Hx   . Mental illness Neg Hx   . Mental retardation Neg Hx   . Miscarriages / Stillbirths Neg Hx   . Stroke Neg Hx   . Vision loss Neg Hx    Social History:  History  Alcohol Use No     History  Drug Use No    History   Social History  . Marital Status: Married    Spouse Name: N/A  . Number of Children: N/A  . Years of Education: N/A   Social History Main Topics  . Smoking status: Never Smoker   . Smokeless tobacco: Never Used  . Alcohol Use: No  . Drug Use: No  . Sexual Activity: Yes   Other Topics Concern  . None   Social History Narrative   Additional History:    Sleep:  Poor   Appetite:  Fair    Assessment:   Musculoskeletal: Strength & Muscle Tone: within normal limits Gait & Station: normal Patient leans: N/A   Psychiatric Specialty Exam: Physical Exam  Vitals reviewed.   Review of Systems  Constitutional: Negative for fever and chills.  Gastrointestinal: Negative for vomiting.  Musculoskeletal: Positive for neck pain.       Describes ankle pain bilaterally as well   Skin: Negative for rash.  Neurological: Positive for headaches.  Psychiatric/Behavioral: Positive for depression.    Blood pressure 119/78, pulse 134, temperature 97.6 F (36.4 C), temperature source Oral, resp. rate 20, height 5' 4.25" (1.632 m), weight 152 lb (68.947 kg), not currently breastfeeding.Body mass index is 25.89 kg/(m^2).  General Appearance: Fairly Groomed  Engineer, water::  improving - better eye contact today  Speech:  improved rate of speech  Volume:  Decreased  Mood:  Depressed and constricted and slightly irritable in affect   Affect:  still constricted but more reactive , smiles briefly at times, appropriately-  Thought Process:  Goal Directed and Linear  Orientation:  Other:  fully alert and attentive  Thought Content:  denies hallucinations, not overtly internally preoccupied at this time, as per nursing reports guarded, suspicious at times, somatically  focused   Suicidal Thoughts:  No - denies any plan to hurt self at this time  Homicidal Thoughts:  No  Memory:  Recent and remote fair   Judgement:  Fair  Insight:  Lacking  Psychomotor Activity:  Normal- at this time, but intermittently restless   Concentration:  Good  Recall:  Good  Fund of Knowledge:Good  Language: Good  Akathisia:  Negative  Handed:  Right  AIMS (if indicated):     Assets:  Desire for Improvement Resilience Social Support  ADL's:  Fair   Cognition:  Alert and attentive   Sleep:  Number of Hours: 1.5     Current Medications: Current Facility-Administered Medications  Medication Dose Route Frequency Provider Last Rate Last Dose  . alum & mag hydroxide-simeth (MAALOX/MYLANTA) 200-200-20 MG/5ML suspension 30 mL  30 mL Oral Q4H PRN Waylan Boga, NP      . citalopram (CELEXA) tablet 20 mg  20 mg Oral Daily Jenne Campus, MD   20 mg at 12/12/14 0845  . LORazepam (ATIVAN) tablet 1 mg  1 mg Oral Q8H PRN Waylan Boga, NP   1 mg at 12/11/14 1449  . magnesium hydroxide (MILK OF MAGNESIA) suspension 30 mL  30 mL Oral Daily PRN Waylan Boga, NP      . ondansetron [facility]) tablet 4 mg  4 mg Oral Q8H PRN Waylan Boga, NP   4 mg at 12/08/14 2130  . oxyCODONE-acetaminophen (PERCOCET/ROXICET) 5-325 MG per tablet 1 tablet  1 tablet Oral Q6H PRN Jenne Campus, MD   1 tablet at 12/10/14 2007  . propranolol (INDERAL) tablet 10 mg  10 mg Oral BID Janett Labella, NP   10 mg at 12/12/14 2355  . risperiDONE (RISPERDAL) tablet 2 mg  2 mg Oral Daily Jenne Campus, MD   2 mg at 12/12/14 0844  . risperiDONE (RISPERDAL) tablet 3 mg  3 mg Oral QHS Jenne Campus, MD   3 mg at 12/11/14 2042  . sodium chloride (OCEAN) 0.65 % nasal spray 1 spray  1 spray Each Nare PRN Myer Peer Cobos, MD      . topiramate (TOPAMAX) tablet 25 mg  25 mg Oral BID Jenne Campus, MD   25 mg at 12/12/14 7322    Lab Results: No results found for this or any previous visit (from the past 48  hour(s)).  Physical Findings: AIMS: Facial and Oral Movements Muscles of Facial Expression: None, normal Lips and Perioral Area: None, normal Jaw: None, normal Tongue: None, normal,Extremity Movements Upper (arms, wrists, hands, fingers): None, normal Lower (legs, knees, ankles, toes): None, normal, Trunk Movements Neck, shoulders, hips: None, normal, Overall Severity Severity of abnormal movements (highest score from questions above): None, normal Incapacitation due to abnormal movements: None, normal Patient's awareness of abnormal movements (rate only patient's report): No Awareness, Dental Status Current problems with teeth and/or dentures?: No Does patient usually wear dentures?: No  CIWA:    COWS:      Assessment-  Partial improvement, but still depressed, ruminative, somatic, and functioning poorly compared to premorbid baseline as indicated by husband. No side effects. She denies cheeking or spitting out medications. As per husband, she did very well with ECT and returned to normal functioning for a period of time. ECT was well tolerated .    Treatment Plan Summary: Daily contact with patient to assess and evaluate symptoms and progress in treatment, Medication management, Plan continue inpatient treatment and continue medication management as below   Risperidone  2 mgrs  QAM and 3 mgrs QHS Celexa 20 mgrs QDAY  Topamax to  25 mgrs  BID to help with headache prophylaxis Ativan 1 mgr Q 8 hours for severe anxiety or agitation Percocet 5/325 mgr Q 6 hours PRN severe headache. Due to ongoing severity of symptoms and risk of self harm , would continue 1: 1 observation at this present time. Transfer application to [facility] as per patient's request( and otherwise to  [facility]) is being worked on by treatment team, SW. Patient should likely get another ECT course trial based on prior good response . Will order routine Lipid Panel and HgbA1C, and repeat a BMP as  well.  Medical Decision Making:  Review of Psycho-Social Stressors (1), Review or order clinical lab tests (1), Review of Medication Regimen &  Side Effects (2) and Review of New Medication or Change in Dosage (2)  COBOS, FERNANDO  12/12/2014, 5:44 PM

## 2014-12-13 DIAGNOSIS — F323 Major depressive disorder, single episode, severe with psychotic features: Secondary | ICD-10-CM

## 2014-12-13 LAB — LIPID PANEL
CHOL/HDL RATIO: 4.1 ratio
CHOLESTEROL: 159 mg/dL (ref 0–200)
HDL: 39 mg/dL — ABNORMAL LOW (ref >39)
LDL Cholesterol: 112 mg/dL — ABNORMAL HIGH (ref 0–99)
Triglycerides: 38 mg/dL (ref <150)
VLDL: 8 mg/dL (ref 0–40)

## 2014-12-13 LAB — BASIC METABOLIC PANEL
Anion gap: 5 (ref 5–15)
BUN: 21 mg/dL (ref 6–23)
CHLORIDE: 113 mmol/L — AB (ref 96–112)
CO2: 24 mmol/L (ref 19–32)
CREATININE: 0.8 mg/dL (ref 0.50–1.10)
Calcium: 8.9 mg/dL (ref 8.4–10.5)
GFR calc Af Amer: >90 mL/min (ref >90)
GFR calc non Af Amer: >90 mL/min (ref >90)
Glucose, Bld: 92 mg/dL (ref 70–99)
Potassium: 3.9 mmol/L (ref 3.5–5.1)
Sodium: 142 mmol/L (ref 135–145)

## 2014-12-13 MED ORDER — RISPERIDONE 2 MG PO TABS
2.0000 mg | ORAL_TABLET | Freq: Every day | ORAL | Status: DC
  Administered 2014-12-13 – 2014-12-15 (×3): 2 mg via ORAL
  Filled 2014-12-13 (×4): qty 1

## 2014-12-13 MED ORDER — ACETAMINOPHEN 325 MG PO TABS
650.0000 mg | ORAL_TABLET | Freq: Four times a day (QID) | ORAL | Status: DC | PRN
  Administered 2014-12-13 – 2014-12-16 (×4): 650 mg via ORAL
  Filled 2014-12-13 (×5): qty 2

## 2014-12-13 MED ORDER — MENTHOL 3 MG MT LOZG
1.0000 | LOZENGE | OROMUCOSAL | Status: DC | PRN
  Administered 2014-12-13 – 2014-12-14 (×2): 3 mg via ORAL

## 2014-12-13 MED ORDER — RISPERIDONE 1 MG PO TABS
1.0000 mg | ORAL_TABLET | Freq: Every day | ORAL | Status: DC
  Administered 2014-12-14 – 2014-12-16 (×3): 1 mg via ORAL
  Filled 2014-12-13 (×4): qty 1

## 2014-12-13 MED ORDER — BENZTROPINE MESYLATE 0.5 MG PO TABS
0.5000 mg | ORAL_TABLET | Freq: Two times a day (BID) | ORAL | Status: DC
Start: 2014-12-13 — End: 2014-12-16
  Administered 2014-12-13 – 2014-12-16 (×6): 0.5 mg via ORAL
  Filled 2014-12-13 (×8): qty 1

## 2014-12-13 NOTE — Clinical Social Work Note (Signed)
Patient has been accepted for [facility] on Monday for ECT treatments. Husband Ibounou Maiga informed, agreeable. No interpreter available at this time to discuss with patient. MD agreeable to transfer on Monday 2/29. CSW informed Jerilynn SomCalvin at [facility] [phone], who reports that he put patient on list for admission on Monday.  Samuella BruinKristin Tamaiya Bump, MSW, [employer] Clinical Social Worker [facility] [phone]

## 2014-12-13 NOTE — Progress Notes (Signed)
Patient ID: Catherine Gomez, female   DOB: [birth year], 24 y.o.   MRN: [id number] [facility] MD Progress Note  12/13/2014 12:20 PM Meeka Swallows  MRN:  [id number] Subjective:  Patient  Has less somatic complaints, concerns today. States headache is " still there" but not as severe. Does not describe other aches and pains she was ruminative about yesterday. She reports sedation, some sialorrhea .   Objective: I have discussed case with treatment team. I met with patient with assistance from translator.  Patient presents sedated, slightly drowsy today.  She has had orthostatic blood pressure/pulse changes, and does feel " dizzy if I get up too quickly".  Most recent vitals 105/60, pulse 104.  As noted above, she also reports some sialorrhea, although no drooling or speech difficulty is noted. Gait is normal, but does have some mild cog wheeling. She continues to be in agreement of an inpatient ECT course , based on prior good response. As discussed with staff, patient may be accepted to [facility], but there is no current bed availability, and referral process has been initiated to [facility], in order to obtain ECT consult and treatment if indicated there. Some group participation , but interactions with peers and staff are limited. No grossly disruptive behaviors. As discussed with staff , patient is accepting PO fluids. Labs reviewed- BMP unremarkable. Patient states she still has hallucinations, but now they are described as " noises", like a " repetitive su-su-su noise" , and not actual voices or screams any longer . She does not seem internally preoccupied at this time.    Principal Problem: MDD, Severe, with psychotic features  Diagnosis:   Patient Active Problem List   Diagnosis Date Noted  . Major depressive disorder, single episode, severe without psychotic features [F32.2]   . 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 Suicidal ideation [R45.851]  12/04/2014  . Severe major depression without psychotic features [F32.2] 12/04/2014  . Homicidal ideation [R45.850]   . Headache [R51] 07/16/2014  . Hypersalivation [K11.7] 07/16/2014  . Psychosis [F29] 04/08/2014  . Postpartum depression [F53] 04/07/2014  . Severe preeclampsia [O14.10] 12/18/2011  . Normal delivery [O80, Z37.9] 12/18/2011  . Urinary tract infection during pregnancy [O23.40] 05/24/2011   Total Time spent with patient: 25 minutes    Past Medical History:  Past Medical History  Diagnosis Date  . Extended spectrum beta lactamase (ESBL) resistance   . Headache(784.0)   . Depression     Past Surgical History  Procedure Laterality Date  . No past surgeries     Family History:  Family History  Problem Relation Age of Onset  . Alcohol abuse Neg Hx   . Arthritis Neg Hx   . Asthma Neg Hx   . Birth defects Neg Hx   . Cancer Neg Hx   . COPD Neg Hx   . Depression Neg Hx   . Diabetes Neg Hx   . Drug abuse Neg Hx   . Early death Neg Hx   . Hearing loss Neg Hx   . Heart disease Neg Hx   . Hyperlipidemia Neg Hx   . Hypertension Neg Hx   . Kidney disease Neg Hx   . Learning disabilities Neg Hx   . Mental illness Neg Hx   . Mental retardation Neg Hx   . Miscarriages / Stillbirths Neg Hx   . Stroke Neg Hx   . Vision loss Neg Hx    Social History:  History  Alcohol Use No  History  Drug Use No    History   Social History  . Marital Status: Married    Spouse Name: N/A  . Number of Children: N/A  . Years of Education: N/A   Social History Main Topics  . Smoking status: Never Smoker   . Smokeless tobacco: Never Used  . Alcohol Use: No  . Drug Use: No  . Sexual Activity: Yes   Other Topics Concern  . None   Social History Narrative   Additional History:    Sleep:  Poor   Appetite:  Fair    Assessment:   Musculoskeletal: Strength & Muscle Tone: within normal limits- some cogwheeling noted  Gait & Station: normal Patient leans:  N/A   Psychiatric Specialty Exam: Physical Exam  Vitals reviewed.   Review of Systems  Constitutional: Negative for fever and chills.  HENT:       Sialorrhea  Respiratory: Negative for cough and shortness of breath.   Cardiovascular: Negative for chest pain.  Skin: Negative for rash.  Neurological: Positive for headaches.  Psychiatric/Behavioral: Positive for depression and hallucinations.    Blood pressure 105/60, pulse 104, temperature 98.4 F (36.9 C), temperature source Oral, resp. rate 20, height 5' 4.25" (1.632 m), weight 152 lb (68.947 kg), not currently breastfeeding.Body mass index is 25.89 kg/(m^2).  General Appearance: Fairly Groomed  Engineer, water::  improving - better eye contact today  Speech:  improved rate of speech  Volume:  Decreased  Mood:  Depressed and constricted and slightly irritable in affect   Affect:  still constricted but more reactive , smiles briefly at times, appropriately-  Thought Process:  Goal Directed and Linear  Orientation:  Other:  fully alert and attentive  Thought Content:  denies hallucinations, not overtly internally preoccupied at this time, as per nursing reports guarded, suspicious at times, somatically focused   Suicidal Thoughts:  No - denies any plan to hurt self at this time  Homicidal Thoughts:  No  Memory:  Recent and remote fair   Judgement:  Fair  Insight:  Lacking  Psychomotor Activity:  Normal- at this time, but intermittently restless   Concentration:  Good  Recall:  Good  Fund of Knowledge:Good  Language: Good  Akathisia:  Negative  Handed:  Right  AIMS (if indicated):     Assets:  Desire for Improvement Resilience Social Support  ADL's:  Fair   Cognition:  Alert and attentive   Sleep:  Number of Hours: 1.5     Current Medications: Current Facility-Administered Medications  Medication Dose Route Frequency Provider Last Rate Last Dose  . alum & mag hydroxide-simeth (MAALOX/MYLANTA) 200-200-20 MG/5ML suspension  30 mL  30 mL Oral Q4H PRN Waylan Boga, NP      . citalopram (CELEXA) tablet 20 mg  20 mg Oral Daily Jenne Campus, MD   20 mg at 12/13/14 [phone]  . LORazepam (ATIVAN) tablet 1 mg  1 mg Oral Q8H PRN Waylan Boga, NP   1 mg at 12/12/14 2011  . magnesium hydroxide (MILK OF MAGNESIA) suspension 30 mL  30 mL Oral Daily PRN Waylan Boga, NP      . ondansetron [facility]) tablet 4 mg  4 mg Oral Q8H PRN Waylan Boga, NP   4 mg at 12/08/14 2130  . oxyCODONE-acetaminophen (PERCOCET/ROXICET) 5-325 MG per tablet 1 tablet  1 tablet Oral Q6H PRN Jenne Campus, MD   1 tablet at 12/10/14 2007  . propranolol (INDERAL) tablet 10 mg  10 mg Oral BID  Freda Munro May Agustin, NP   10 mg at 12/13/14 4098  . risperiDONE (RISPERDAL) tablet 2 mg  2 mg Oral Daily Jenne Campus, MD   2 mg at 12/13/14 [phone]  . risperiDONE (RISPERDAL) tablet 3 mg  3 mg Oral QHS Jenne Campus, MD   3 mg at 12/12/14 2135  . sodium chloride (OCEAN) 0.65 % nasal spray 1 spray  1 spray Each Nare PRN Jenne Campus, MD      . topiramate (TOPAMAX) tablet 25 mg  25 mg Oral BID Jenne Campus, MD   25 mg at 12/13/14 4782    Lab Results:  Results for orders placed or performed during the hospital encounter of 12/04/14 (from the past 48 hour(s))  Basic metabolic panel     Status: Abnormal   Collection Time: 12/13/14  7:12 AM  Result Value Ref Range   Sodium 142 135 - 145 mmol/L   Potassium 3.9 3.5 - 5.1 mmol/L   Chloride 113 (H) 96 - 112 mmol/L   CO2 24 19 - 32 mmol/L   Glucose, Bld 92 70 - 99 mg/dL   BUN 21 6 - 23 mg/dL   Creatinine, Ser 0.80 0.50 - 1.10 mg/dL   Calcium 8.9 8.4 - 10.5 mg/dL   GFR calc non Af Amer >90 >90 mL/min   GFR calc Af Amer >90 >90 mL/min    Comment: (NOTE) The eGFR has been calculated using the CKD EPI equation. This calculation has not been validated in all clinical situations. eGFR's persistently <90 mL/min signify possible Chronic Kidney Disease.    Anion gap 5 5 - 15    Comment: Performed at [facility]  Lipid panel     Status: Abnormal   Collection Time: 12/13/14  7:12 AM  Result Value Ref Range   Cholesterol 159 0 - 200 mg/dL   Triglycerides 38 <150 mg/dL   HDL 39 (L) >39 mg/dL   Total CHOL/HDL Ratio 4.1 RATIO   VLDL 8 0 - 40 mg/dL   LDL Cholesterol 112 (H) 0 - 99 mg/dL    Comment:        Total Cholesterol/HDL:CHD Risk Coronary Heart Disease Risk Table                     Men   Women  1/2 Average Risk   3.4   3.3  Average Risk       5.0   4.4  2 X Average Risk   9.6   7.1  3 X Average Risk  23.4   11.0        Use the calculated Patient Ratio above and the CHD Risk Table to determine the patient's CHD Risk.        ATP III CLASSIFICATION (LDL):  <100     mg/dL   Optimal  100-129  mg/dL   Near or Above                    Optimal  130-159  mg/dL   Borderline  160-189  mg/dL   High  >190     mg/dL   Very High Performed at [facility]     Physical Findings: AIMS: Facial and Oral Movements Muscles of Facial Expression: None, normal Lips and Perioral Area: None, normal Jaw: None, normal Tongue: None, normal,Extremity Movements Upper (arms, wrists, hands, fingers): None, normal Lower (legs, knees, ankles, toes): None, normal, Trunk Movements Neck, shoulders, hips: None,  normal, Overall Severity Severity of abnormal movements (highest score from questions above): None, normal Incapacitation due to abnormal movements: None, normal Patient's awareness of abnormal movements (rate only patient's report): No Awareness, Dental Status Current problems with teeth and/or dentures?: No Does patient usually wear dentures?: No  CIWA:    COWS:      Assessment-  Patient remains symptomatic, although less agitated, calmer, and severity of hallucinations reported as improved. Has developed some medication side effects, such as sialorrhea, orthostasis, and cog wheeling, most likely from Risperidone trial. We have discussed and she is not interested in changing  antipsychotic regimen- will decrease dose and monitor. Plan at this time is for patient to be transferred to either [facility] - when bed becomes available, or to [facility], for inpatient ECT treatment.    Treatment Plan Summary: Daily contact with patient to assess and evaluate symptoms and progress in treatment, Medication management, Plan continue inpatient treatment and continue medication management as below   Decrease Risperidone   To 1  mgrs  QAM and 2  mgrs QHS Start Cogentin 0.5 mgrs BID  Celexa 20 mgrs QDAY  Topamax to  25 mgrs  BID  Ativan 1 mgr Q 8 hours for severe anxiety or agitation Percocet 5/325 mgr Q 6 hours PRN severe headache. Proceeding with transfer options as above- SW has sent information and we are awaiting acceptance/bed availability. Patient and husband are currently agreeing to ECT .   Medical Decision Making:  Review of Psycho-Social Stressors (1), Review or order clinical lab tests (1), Review of Medication Regimen & Side Effects (2) and Review of New Medication or Change in Dosage (2)  COBOS, FERNANDO  12/13/2014, 12:20 PM

## 2014-12-13 NOTE — Clinical Social Work Note (Signed)
CSW spoke with [facility]Laurie [facility] who reports no bed availability today. CS[facility] to [facility] on Monday 2/29.  Samuella BruinKristin Marcelina Mclaurin, MSW, [employer] Clinical Social Worker [facility] [phone]

## 2014-12-13 NOTE — Progress Notes (Addendum)
Pt remains a 1:1 and is awaiting her interpreter to arrive. Pt is pleasant . MD made aware of pts heart rate ad that pt is c/o bilateral hand pain. MD will consult with a hospitalist concerning pts symptoms. Pt is very quiet and reserved . She appears very blunted and depressed. Pt did ask for apple juice and was given a pitcher and encouraged to drink fluid today. Pt does not appear anxious. Her lips are dry and she does have a slight tilt. No c/o chest pain this am. Heart rate intermittently is irregular.Pt hands are cool to the touch with positive capillary refill. Np made aware. Pt stated her hands do change color to red on occasion and her ankles hurt her too.2pm-Pt remains a 1;1 and remains safe.She c/o headache and was ordered two tylenol to take. Pt also has been encouraged to push po fluids. 5pm-Pt appears brighter and is drinking po fluid. Pt prefers applejuice . Pt still has a slight tilt. Pt did tell the nurse she has two little girls ages 661 and 25 years old. She does smile when speaking about them. 6pm-1:1 continues. Pt remains safe and presently is in the dayroom with the other pts. Sipping on some juice. Her affect appears happier and not as flat and blunted. 6:15pm-Pt c/o sore throat. NP made aware -pt was given a cepocal  lozenger and given more juice to drink. Pt was also given 2 tylenol for a headache.7;20pm- Per NP pt will be seen first thing in the am by the hospitalist.  Pt does appear very groggy.

## 2014-12-13 NOTE — Clinical Social Work Note (Signed)
CSW left voicemail for [facility] [phone] regarding bed availability for today. Awaiting return call.  Samuella BruinKristin Giamarie Bueche, MSW, [employer] Clinical Social Worker [facility] [phone]

## 2014-12-13 NOTE — Progress Notes (Signed)
NURSING 1:1 NOTE:  Patient remains on 1:1 for self safety. Patient was seen lying in bed with eyes closed. Patient responded by opening her eyes. Patient refused to speak/talk to the writer. Her body is warm to touch, dry, shaky hands, lips pale, dry and cracked. Vital signs as follows: T- 98.2, P - 70, Bp - 120/76. Patient accepted her scheduled medication. Patient encouraged to take fluids. Will continue to monitor and observe patient for stability.  NP notified of the patient's condition. Patient remains on 1:1

## 2014-12-13 NOTE — Clinical Social Work Note (Signed)
[facility] LCSW Group Therapy 12/13/2014  1:15 PM   Type of Therapy: Group Therapy  Participation Level: Did Not Attend. Patient invited to participate in group but declined.   Samuella BruinKristin Osbaldo Mark, MSW, [employer]CSWA Clinical Social Worker [facility] [phone]

## 2014-12-13 NOTE — Progress Notes (Signed)
D)  Has been sleeping tonight, eyes closed, snoring respirations, no c/o's voiced. A)   Remains on 1:1 obs for safety, mht at bedside. R)  Safety maintained.

## 2014-12-13 NOTE — [facility] Group Notes (Signed)
[facility]  12/13/2014  8:45 AM  Participation Quality: Did Not Attend. Patient invited to participate but declined.  Samuella BruinKristin Vylette Strubel, MSW, [employer]CSWA Clinical Social Worker [facility] [phone]

## 2014-12-13 NOTE — [facility] Group Notes (Signed)
[facility] Group Notes:  (Nursing/MHT/Case Management/Adjunct)  Date:  12/13/2014  Time:  1000  Type of Therapy:  Psychoeducational Skills  Participation Level:  Did Not Attend  Participation Quality:    Affect:    Cognitive:    Insight:    Engagement in Group:    Modes of Intervention:    Summary of Progress/Problems:  Layla BarterWhite, Cary Lothrop L 12/13/2014, 12:18 PM

## 2014-12-13 NOTE — Clinical Social Work Note (Signed)
CSW spoke with [facility] [phone] ACTT team member Cordelia PocheLawanda Davis regarding patient's progress and possible discharge plans.  Samuella BruinKristin Marylon Verno, MSW, [employer]CSWA Clinical Social Worker [facility] [phone]

## 2014-12-13 NOTE — Clinical Social Work Note (Signed)
CSW spoke with Fritzi MandesKirsten at [employer] [phone] to secure interpreter through Monday 12/16/14.  Samuella BruinKristin Akane Tessier, MSW, [employer]CSWA Clinical Social Worker [facility] [phone]

## 2014-12-13 NOTE — Tx Team (Signed)
Interdisciplinary Treatment Plan Update (Adult) Date: 12/13/2014   Time Reviewed: 9:30 AM  Progress in Treatment: Attending groups: Yes Participating in groups: Minimally Taking medication as prescribed: Yes Tolerating medication: Yes Family/Significant other contact made: Yes, CSW has spoken with patient's husband Patient understands diagnosis: Yes Discussing patient identified problems/goals with staff: Yes Medical problems stabilized or resolved: Yes Denies suicidal/homicidal ideation: Yes Issues/concerns per patient self-inventory: Yes Other:  New problem(s) identified: N/A  Discharge Plan or Barriers:   2/19: Patient plans to return home to continue services with ACTT team at discharge.  2/23: Patient reports lack of improvement in symptoms with worsening headache. CSW spoke with husband who is agreeable to family meeting today at 2:30. Patient and husband [facility].   2/26: Patient continues to complain of headaches and lack of improvement in symptoms. It is unclear if she can contract for safety, husband feels that it would be unsafe for her to return home at this time. [facility] depression program referral pending- awaiting on bed availability. [facility]  Reason for Continuation of Hospitalization:  Depression Anxiety Medication Stabilization   Comments: N/A  Estimated length of stay: 2-3 days  For review of initial/current patient goals, please see plan of care.  Patient is a 24 year old African female admitted with increasing depression and headaches. [place] in [place] with her husband and 2 children. She currently receives services with an ACTT team. Patient will benefit from crisis stabilization, medication evaluation, group therapy, and psycho education in addition to case management for discharge planning. Patient and CSW  reviewed pt's identified goals and treatment plan. Pt verbalized understanding and agreed to treatment plan.  Attendees: Patient:    Family:    Physician: Dr. Jama Flavorsobos; Dr. Dub MikesLugo 12/13/2014 9:30 AM  Nursing: Manuela SchwartzJennifer Pritchett, Kathi SimpersSarah Twyman, Rodman KeyJanet Webb,  RN 12/13/2014 9:30 AM  Clinical Social Worker: Samuella BruinKristin Jariah Tarkowski,  LCSWA 12/13/2014 9:30 AM  Other: Juline PatchQuylle Hodnett, LCSW 12/13/2014 9:30 AM  Other: Leisa LenzValerie Enoch, [facility]esta [employer] Liaison 12/13/2014 9:30 AM  Other: Onnie BoerJennifer Clark, Case Manager 12/13/2014 9:30 AM  Other: Mosetta AnisAggie Nwoko, Laura Davis  NP 12/13/2014 9:30 AM  Other: Tomasita Morrowelora Sutton, P4CC 12/13/2014 9:30 AM  Other:        Scribe for Treatment Team:  Samuella BruinKristin Tobiah Celestine, MSW, [employer]CSWA [phone]

## 2014-12-13 NOTE — Clinical Social Work Note (Signed)
Per MD request, [facility] treatment referral.  Samuella BruinKristin Cedrick Partain, MSW, [facility]WA Clinical Social Worker [facility] [phone]

## 2014-12-14 LAB — HEMOGLOBIN A1C
HEMOGLOBIN A1C: 5.7 % — AB (ref 4.8–5.6)
Mean Plasma Glucose: 117 mg/dL

## 2014-12-14 NOTE — Progress Notes (Signed)
Nursing Note 1:1 Patient remains on 1:1 for safety. Seen using the bathroom. Patient observed having a stuffy nose. Offered and accepted nasal spray, 1 spray on each nose. Encouraged copious fluid intake. Patient still not responding actively. Can only respond to YES or NO answers by nodding. Not in any distress. Will continue to monitor for safety and stability.

## 2014-12-14 NOTE — Plan of Care (Signed)
Problem: Consults Goal: Depression Patient Education See Patient Education Module for education specifics.  Outcome: Completed/Met Date Met:  12/14/14 Nurse discussed depression with pt.

## 2014-12-14 NOTE — [facility] Group Notes (Signed)
The focus of this group is to educate the patient on the purpose and policies of crisis stabilization and provide a format to answer questions about their admission.  The group details unit policies and expectations of patients while admitted.  Patient came to 0900 nurse education orientation group this morning.  Patient listened with interpreter present.  Patient had flat affect, blunted.    Interpreter will talk with patient about today's goal.

## 2014-12-14 NOTE — [facility] Group Notes (Signed)
[facility] Group Notes: (Clinical Social Work)   12/14/2014      Type of Therapy:  Group Therapy   Participation Level:  Did Not Attend - was initially with NP, then refused to join grup    Ambrose MantleMareida Grossman-Orr, LCSW 12/14/2014, 2:50 PM

## 2014-12-14 NOTE — Progress Notes (Signed)
Patient ID: Catherine Gomez, female   DOB: 06-14-1991, 24 y.o.   MRN: [id number] [facility] MD Progress Note  12/14/2014 12:08 PM Catherine Gomez  MRN:  [id number] Subjective:   -long conversation and assessment with the interpretor  -difficult to decipher facts, many contraindicating statements.  She ruminates about her headaches and relates all her mental health issues to the headache.   -I worry that there is a cultural component to recognizing mental illness, she reports that her husband is not happy about her being here      She denies depression/anxiety and SI    She still endorses hearing a repetitive "su-su-su noise"  She continues to report sedation.  VS have been stable   Plan is ECT as initaited by physician,  patient may be accepted to [facility], but there is no current bed availability, and referral process has been initiated to [facility], in order to obtain ECT consult and treatment if indicated there.  No information received today on status  Some group participation, encouraged to utilize interpretor, but interactions with peers and staff are limited.        Principal Problem: MDD, Severe, with psychotic features  Diagnosis:   Patient Active Problem List   Diagnosis Date Noted  . Major depressive disorder, single episode, severe without psychotic features [F32.2]   . 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 Suicidal ideation [R45.851] 12/04/2014  . Severe major depression without psychotic features [F32.2] 12/04/2014  . Homicidal ideation [R45.850]   . Headache [R51] 07/16/2014  . Hypersalivation [K11.7] 07/16/2014  . Psychosis [F29] 04/08/2014  . Postpartum depression [F53] 04/07/2014  . Severe preeclampsia [O14.10] 12/18/2011  . Normal delivery [O80, Z37.9] 12/18/2011  . Urinary tract infection during pregnancy [O23.40] 05/24/2011   Total Time spent with patient: 40 minutes   Past Medical History:  Past Medical History   Diagnosis Date  . Extended spectrum beta lactamase (ESBL) resistance   . Headache(784.0)   . Depression     Past Surgical History  Procedure Laterality Date  . No past surgeries     Family History:  Family History  Problem Relation Age of Onset  . Alcohol abuse Neg Hx   . Arthritis Neg Hx   . Asthma Neg Hx   . Birth defects Neg Hx   . Cancer Neg Hx   . COPD Neg Hx   . Depression Neg Hx   . Diabetes Neg Hx   . Drug abuse Neg Hx   . Early death Neg Hx   . Hearing loss Neg Hx   . Heart disease Neg Hx   . Hyperlipidemia Neg Hx   . Hypertension Neg Hx   . Kidney disease Neg Hx   . Learning disabilities Neg Hx   . Mental illness Neg Hx   . Mental retardation Neg Hx   . Miscarriages / Stillbirths Neg Hx   . Stroke Neg Hx   . Vision loss Neg Hx    Social History:  History  Alcohol Use No     History  Drug Use No    History   Social History  . Marital Status: Married    Spouse Name: N/A  . Number of Children: N/A  . Years of Education: N/A   Social History Main Topics  . Smoking status: Never Smoker   . Smokeless tobacco: Never Used  . Alcohol Use: No  . Drug Use: No  . Sexual Activity: Yes   Other Topics Concern  .  None   Social History Narrative   Additional History:    Sleep:  Poor   Appetite:  Fair    Assessment:   Musculoskeletal: Strength & Muscle Tone: within normal limits- Gait & Station: normal Patient leans: N/A   Psychiatric Specialty Exam: Physical Exam  Vitals reviewed. Constitutional: She is oriented to person, place, and time. She appears well-developed and well-nourished.  HENT:  Head: Normocephalic and atraumatic.  Neck: Normal range of motion. Neck supple.  Neurological: She is alert and oriented to person, place, and time.  Skin: Skin is warm and dry.    ROS  Blood pressure 118/62, pulse 114, temperature 98.6 F (37 C), temperature source Oral, resp. rate 18, height 5' 4.25" (1.632 m), weight 68.947 kg (152 lb), SpO2  100 %, not currently breastfeeding.Body mass index is 25.89 kg/(m^2).  General Appearance: Fairly Groomed  Engineer, water:: poor eye contact today  Speech:  slow  Volume:  Decreased  Mood:  Depressed and constricted and slightly irritable in affect   Affect:  Flat   Thought Process:  Tangential, disinterested  Orientation:  Alert and oriented   Hallucinations: reports hearing constant su su su noice  Suicidal Thoughts:  No - denies any plan to hurt self at this time  Homicidal Thoughts:  No  Memory:  Recent and remote fair   Judgement:  poor  Insight:  Lacking  Psychomotor Activity:  Normal- at this time, but intermittently restless   Concentration:  Fair  Recall:  Good  Fund of Knowledge:Good  Language: Good  Akathisia:  Negative  Handed:  Right  AIMS (if indicated):     Assets:  Desire for Improvement Resilience Social Support  ADL's:  Fair   Cognition:  Alert and attentive   Sleep:  Number of Hours: 1.5     Current Medications: Current Facility-Administered Medications  Medication Dose Route Frequency Provider Last Rate Last Dose  . acetaminophen (TYLENOL) tablet 650 mg  650 mg Oral Q6H PRN Encarnacion Slates, NP   650 mg at 12/13/14 1414  . alum & mag hydroxide-simeth (MAALOX/MYLANTA) 200-200-20 MG/5ML suspension 30 mL  30 mL Oral Q4H PRN Waylan Boga, NP      . benztropine (COGENTIN) tablet 0.5 mg  0.5 mg Oral BID Jenne Campus, MD   0.5 mg at 12/14/14 0802  . citalopram (CELEXA) tablet 20 mg  20 mg Oral Daily Jenne Campus, MD   20 mg at 12/14/14 0802  . LORazepam (ATIVAN) tablet 1 mg  1 mg Oral Q8H PRN Waylan Boga, NP   1 mg at 12/12/14 2011  . magnesium hydroxide (MILK OF MAGNESIA) suspension 30 mL  30 mL Oral Daily PRN Waylan Boga, NP      . menthol-cetylpyridinium (CEPACOL) lozenge 3 mg  1 lozenge Oral PRN Elmarie Shiley, NP   3 mg at 12/14/14 0802  . ondansetron (ZOFRAN) tablet 4 mg  4 mg Oral Q8H PRN Waylan Boga, NP   4 mg at 12/08/14 2130  .  oxyCODONE-acetaminophen (PERCOCET/ROXICET) 5-325 MG per tablet 1 tablet  1 tablet Oral Q6H PRN Jenne Campus, MD   1 tablet at 12/10/14 2007  . propranolol (INDERAL) tablet 10 mg  10 mg Oral BID Janett Labella, NP   10 mg at 12/14/14 0801  . risperiDONE (RISPERDAL) tablet 1 mg  1 mg Oral Daily Jenne Campus, MD   1 mg at 12/14/14 0802  . risperiDONE (RISPERDAL) tablet 2 mg  2 mg Oral QHS Felicita Gage  A Cobos, MD   2 mg at 12/13/14 2147  . sodium chloride (OCEAN) 0.65 % nasal spray 1 spray  1 spray Each Nare PRN Jenne Campus, MD   1 spray at 12/14/14 0207  . topiramate (TOPAMAX) tablet 25 mg  25 mg Oral BID Jenne Campus, MD   25 mg at 12/14/14 0801    Lab Results:  Results for orders placed or performed during the hospital encounter of 12/04/14 (from the past 48 hour(s))  Basic metabolic panel     Status: Abnormal   Collection Time: 12/13/14  7:12 AM  Result Value Ref Range   Sodium 142 135 - 145 mmol/L   Potassium 3.9 3.5 - 5.1 mmol/L   Chloride 113 (H) 96 - 112 mmol/L   CO2 24 19 - 32 mmol/L   Glucose, Bld 92 70 - 99 mg/dL   BUN 21 6 - 23 mg/dL   Creatinine, Ser 0.80 0.50 - 1.10 mg/dL   Calcium 8.9 8.4 - 10.5 mg/dL   GFR calc non Af Amer >90 >90 mL/min   GFR calc Af Amer >90 >90 mL/min    Comment: (NOTE) The eGFR has been calculated using the CKD EPI equation. This calculation has not been validated in all clinical situations. eGFR's persistently <90 mL/min signify possible Chronic Kidney Disease.    Anion gap 5 5 - 15    Comment: Performed at [facility]  Hemoglobin A1c     Status: Abnormal   Collection Time: 12/13/14  7:12 AM  Result Value Ref Range   Hgb A1c MFr Bld 5.7 (H) 4.8 - 5.6 %    Comment: (NOTE)         Pre-diabetes: 5.7 - 6.4         Diabetes: >6.4         Glycemic control for adults with diabetes: <7.0    Mean Plasma Glucose 117 mg/dL    Comment: (NOTE) Performed At: [facility] [place], [place]  [id number] Lindon Romp MD JD:[id number] Performed at [facility]   Lipid panel     Status: Abnormal   Collection Time: 12/13/14  7:12 AM  Result Value Ref Range   Cholesterol 159 0 - 200 mg/dL   Triglycerides 38 <150 mg/dL   HDL 39 (L) >39 mg/dL   Total CHOL/HDL Ratio 4.1 RATIO   VLDL 8 0 - 40 mg/dL   LDL Cholesterol 112 (H) 0 - 99 mg/dL    Comment:        Total Cholesterol/HDL:CHD Risk Coronary Heart Disease Risk Table                     Men   Women  1/2 Average Risk   3.4   3.3  Average Risk       5.0   4.4  2 X Average Risk   9.6   7.1  3 X Average Risk  23.4   11.0        Use the calculated Patient Ratio above and the CHD Risk Table to determine the patient's CHD Risk.        ATP III CLASSIFICATION (LDL):  <100     mg/dL   Optimal  100-129  mg/dL   Near or Above                    Optimal  130-159  mg/dL   Borderline  160-189  mg/dL  High  >190     mg/dL   Very High Performed at [facility]     Physical Findings: AIMS: Facial and Oral Movements Muscles of Facial Expression: None, normal Lips and Perioral Area: None, normal Jaw: None, normal Tongue: None, normal,Extremity Movements Upper (arms, wrists, hands, fingers): None, normal Lower (legs, knees, ankles, toes): None, normal, Trunk Movements Neck, shoulders, hips: None, normal, Overall Severity Severity of abnormal movements (highest score from questions above): None, normal Incapacitation due to abnormal movements: None, normal Patient's awareness of abnormal movements (rate only patient's report): No Awareness, Dental Status Current problems with teeth and/or dentures?: No Does patient usually wear dentures?: No  CIWA:    COWS:      Assessment-   Patient is calm and cooperative. Gait is stable, no orthostasis, ambulating in halls. Reports somnolence, continue current medications and doses and monitor     Plan at this time is for patient to be transferred to either  [facility] - when bed becomes available, or to [facility], for inpatient ECT treatment.    Treatment Plan Summary: Daily contact with patient to assess and evaluate symptoms and progress in treatment, Medication management, Plan continue inpatient treatment and continue medication management as below   Decrease Risperidone   To 1  mgrs  QAM and 2  mgrs QHS Start Cogentin 0.5 mgrs BID  Celexa 20 mgrs QDAY  Topamax to  25 mgrs  BID  Ativan 1 mgr Q 8 hours for severe anxiety or agitation Percocet 5/325 mgr Q 6 hours PRN severe headache. Proceeding with transfer options as above- SW has sent information and we are awaiting acceptance/bed availability. Patient and husband are currently agreeing to ECT .   Medical Decision Making:  Review of Psycho-Social Stressors (1), Review or order clinical lab tests (1), Review of Medication Regimen & Side Effects (2) and Review of New Medication or Change in Dosage (2)  -patient c/o feeling sleepy from medications, educated that this should start to subside  [facility], [place] 12/14/2014, 12:08 PM   I agreed with the findings, treatment and disposition plan of this patient. Berniece Andreas, MD

## 2014-12-14 NOTE — Progress Notes (Signed)
1:1 nsg note-  Patient continues to be quiet and guarded.  She did have a snack this afternoon and has no physical complaints.  Compliant with scheduled medications.  Support offered. Continue 1:1 for safety.  Provider informed to re enter 1:1 orders.

## 2014-12-14 NOTE — Progress Notes (Addendum)
0730  Patient in bed with 1:1 present.  Patient asleep.  Respirations even and unlabored.  No signs/symptoms of pain/distress noted on patient's face/body movements.  Patient safety maintained with 1:1 per MD orders.  1033  Patient sitting in dayroom with 1:1 present for safety.  Patient came to morning group, interpreter present.  No signs/symptoms of pain/distress noted on patient's face or body movements.  Respirations even and unlabored.  Safety maintained with 1:1 present per MD order.  1200  Patient went to group this morning with interpreter.  Has been quiet.  Respirations even and unlabored.  No signs/symptoms of pain/distress noted on patient's face/body movements.  Patient encouraged to drink fluids.  Safety maintained with 1:1 present per MD order.  1300  Patient ate 80% of her lunch.  1:1 continues for safety.  Respirations even and unlabored.  No signs/symptoms of pain/distress noted on patient's face/body movements.  Safety maintained with 1:1 per MD orders.

## 2014-12-14 NOTE — Progress Notes (Signed)
 .  Psychoeducational Group Note    Date: 12/14/2014 Time: 1030    Goal Setting Purpose of Group: To be able to set a goal that is measurable and that can be accomplished in one day Participation Level:  None  Participation Quality:  Inattentive  Affect:  Flat  Cognitive:  Disorganized  Insight:  Lacking  Engagement in Group:  Lacking  Additional Comments:  Pt sat with her eyes closed as her interpreter spoke to her. Paid little attention in the group.  Dione HousekeeperJudge, Tatyana Biber A

## 2014-12-14 NOTE — Progress Notes (Signed)
1:1 Nursing Note: Patient seen on day room watching TV. Cheerful and pleasant though she continues to be guarded, quiet and slow. Compliant with medications. C/O headache 6/10. offered and accepted Acetaminophen 650 mg po PRN for pain. Patient made no new compliant. She stated "I'm good" smiling. Support and encouragement offered to the patient. Will continue to monitor for safety and stability. Remains on 1:1 for safety.

## 2014-12-15 NOTE — Plan of Care (Signed)
Problem: Alteration in mood Goal: LTG-Patient reports reduction in suicidal thoughts (Patient reports reduction in suicidal thoughts and is able to verbalize a safety plan for whenever patient is feeling suicidal)  Outcome: Completed/Met Date Met:  12/15/14 Discussed SI thoughts with patient.

## 2014-12-15 NOTE — [facility] Group Notes (Signed)
[facility] Group Notes:  (Clinical Social Work)  12/15/2014   1:15-2:15PM  Summary of Progress/Problems:  The main focus of today's process group was to   identify the patient's current support system and decide on other supports that can be put in place.  The picture on workbook was used to discuss why additional supports are needed.  An emphasis was placed on using counselor, doctor, therapy groups, 12-step groups, and problem-specific support groups to expand supports.   There was also an extensive discussion about what constitutes a healthy support versus an unhealthy support.  The patient expressed understanding through her interpreter, stated that education and her mother are her healthy supports and she does not have any unhealthy ones.  Type of Therapy:  Process Group  Participation Level:  Minimal  Participation Quality:  Attentive   Affect:  Appropriate  Cognitive:  Appropriate   Insight:  Improving  Engagement in Therapy:  Engaged  Modes of Intervention:  Education,  Support and [employer]  Catherine Sanpedro Grossman-Orr, LCSW 12/15/2014, 4:00pm

## 2014-12-15 NOTE — Progress Notes (Signed)
[facility] Post 1:1 Observation Documentation  For the first (8) hours following discontinuation of 1:1 precautions, a progress note entry by nursing staff should be documented at least every 2 hours, reflecting the patient's behavior, condition, mood, and conversation.  Use the progress notes for additional entries.  Time 1:1 discontinued:  12.01 pm  Patient's Behavior:  Appropriate   Patient's Condition:  Patient complained of headache which she rated 5/10 and stuffy nose. Otherwise, general condition is good. No distress noted.   Patient's Conversation: Patient with a Equities traderVisitor. Patient introduced the visitor as her husband (smiling and shying away). Patient stated "I am happy he's here and I feel better today". Patient denied SI, AH/VH. When asked about depression, patient hesitated then shake her head "no".    Glenice LaineIbekwe, Lyrica Mcclarty B 12/15/2014, 8:18 PM

## 2014-12-15 NOTE — Progress Notes (Signed)
[facility] Post 1:1 Observation Documentation  For the first (8) hours following discontinuation of 1:1 precautions, a progress note entry by nursing staff should be documented at least every 2 hours, reflecting the patient's behavior, condition, mood, and conversation.  Use the progress notes for additional entries.  Time 1:1 discontinued:  1201  Patient's Behavior:  Appropriate  Patient's Condition:  Respirations even and unlabored, no signs/symptoms of pain/distress noted on patient's face/body movements.  Patient's Conversation:  In dining room for lunch.  Denied SI and HI, contracts for safety.  Denied A/V hallucinations.  Denied pain.  Earline MayotteKnight, Kathrynn Backstrom Shephard 12/15/2014, 12:31 PM

## 2014-12-15 NOTE — Progress Notes (Signed)
1:1 Nursing Note: Patient lying in bed asleep. No distress noted. Remains on 1:1 for safety.

## 2014-12-15 NOTE — Progress Notes (Signed)
Patient ID: Catherine Gomez, female   DOB: 04/08/1991, 24 y.o.   [id number] [facility]  12/15/2014 10:58 AM Catherine Gomez  MRN:  [id number] Subjective:   -long conversation and assessment with the interpretor   Dr Lolly MustacheArfeen assessed patient and decision to remove 1:1, patient is educated to notify staff if she has any changes in mood or any thoughts of hurting herself at any time day or night  Depression 5/10 Anxiety 6/10   Denies any auditory hallucinations today.  Reports sleep and appetite are good    Persistantly is asking about discharge in the am, if they don't have a bed for me for ECT "I want to go home"  She focuses on  her headaches and relates all her mental health issues to the headaches.  I worry that there is a cultural component to recognizing mental illness, she reports that her husband is not happy about her being here.  Recommend family meeting with interpretor prior to admission, I believe that the patient does not comprehend her mental health as an illness or understands her treatment plan.  Plan is ECT as initaited by physician,  [facility] but there is no current bed availability, and referral process has been initiated to [facility], in order to obtain ECT consult and treatment if indicated there.  No information received today on status  Some group participation, encouraged to utilize interpretor,  interactions with peers and staff remain limited.        Principal Problem: MDD, Severe, with psychotic features  Diagnosis:   Patient Active Problem List   Diagnosis Date Noted  . Major depressive disorder, single episode, severe without psychotic features [F32.2]   . 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 Suicidal ideation [R45.851] 12/04/2014  . Severe major depression without psychotic features [F32.2] 12/04/2014  . Homicidal ideation [R45.850]   . Headache [R51] 07/16/2014  .  Hypersalivation [K11.7] 07/16/2014  . Psychosis [F29] 04/08/2014  . Postpartum depression [F53] 04/07/2014  . Severe preeclampsia [O14.10] 12/18/2011  . Normal delivery [O80, Z37.9] 12/18/2011  . Urinary tract infection during pregnancy [O23.40] 05/24/2011   Total Time spent with patient: 40 minutes   Past Medical History:  Past Medical History  Diagnosis Date  . Extended spectrum beta lactamase (ESBL) resistance   . Headache(784.0)   . Depression     Past Surgical History  Procedure Laterality Date  . No past surgeries     Family History:  Family History  Problem Relation Age of Onset  . Alcohol abuse Neg Hx   . Arthritis Neg Hx   . Asthma Neg Hx   . Birth defects Neg Hx   . Cancer Neg Hx   . COPD Neg Hx   . Depression Neg Hx   . Diabetes Neg Hx   . Drug abuse Neg Hx   . Early death Neg Hx   . Hearing loss Neg Hx   . Heart disease Neg Hx   . Hyperlipidemia Neg Hx   . Hypertension Neg Hx   . Kidney disease Neg Hx   . Learning disabilities Neg Hx   . Mental illness Neg Hx   . Mental retardation Neg Hx   . Miscarriages / Stillbirths Neg Hx   . Stroke Neg Hx   . Vision loss Neg Hx    Social History:  History  Alcohol Use No     History  Drug Use No  History   Social History  . Marital Status: Married    Spouse Name: N/A  . Number of Children: N/A  . Years of Education: N/A   Social History Main Topics  . Smoking status: Never Smoker   . Smokeless tobacco: Never Used  . Alcohol Use: No  . Drug Use: No  . Sexual Activity: Yes   Other Topics Concern  . None   Social History Narrative   Additional History:    Sleep: good Appetite:  good    Assessment:   Musculoskeletal: Strength & Muscle Tone: within normal limits- Gait & Station: normal Patient leans: N/A   Psychiatric Specialty Exam: Physical Exam  Vitals reviewed. Constitutional: She is oriented to person, place, and time. She appears well-developed and well-nourished.  HENT:   Head: Normocephalic and atraumatic.  Neck: Normal range of motion. Neck supple.  Neurological: She is alert and oriented to person, place, and time.  Skin: Skin is warm and dry.    ROS  Blood pressure 103/70, pulse 112, temperature 97.6 F (36.4 C), temperature source Oral, resp. rate 16, height 5' 4.25" (1.632 m), weight 68.947 kg (152 lb), SpO2 100 %, not currently breastfeeding.Body mass index is 25.89 kg/(m^2).  General Appearance: Fairly Groomed  Patent attorney:: poor eye contact today  Speech:  slow  Volume:  Decreased  Mood:  Depressed and constricted and slightly irritable in affect   Affect:  Flat   Thought Process:  goal orietned  Orientation:  Alert and oriented   Hallucinations: denies  Suicidal Thoughts:  No - denies any plan to hurt self at this time  Homicidal Thoughts:  No  Memory:  Recent and remote fair   Judgement:  poor  Insight:  Lacking  Psychomotor Activity:  Slow   Concentration:  Fair  Recall:  poor  Fund of Knowledge:poor  Language: Good  Akathisia:  Negative  Handed:  Right  AIMS (if indicated):     Assets:  Desire for Improvement Resilience Social Support  ADL's:  Fair   Cognition:  Alert and attentive   Sleep:  Number of Hours: 1.5     Current Medications: Current Facility-Administered Medications  Medication Dose Route Frequency Provider Last Rate Last Dose  . acetaminophen (TYLENOL) tablet 650 mg  650 mg Oral Q6H PRN Sanjuana Kava, NP   650 mg at 12/15/14 0558  . alum & mag hydroxide-simeth (MAALOX/MYLANTA) 200-200-20 MG/5ML suspension 30 mL  30 mL Oral Q4H PRN Nanine Means, NP      . benztropine (COGENTIN) tablet 0.5 mg  0.5 mg Oral BID Craige Cotta, MD   0.5 mg at 12/15/14 0819  . citalopram (CELEXA) tablet 20 mg  20 mg Oral Daily Craige Cotta, MD   20 mg at 12/15/14 0819  . LORazepam (ATIVAN) tablet 1 mg  1 mg Oral Q8H PRN Nanine Means, NP   1 mg at 12/12/14 2011  . magnesium hydroxide (MILK OF MAGNESIA) suspension 30 mL  30 mL  Oral Daily PRN Nanine Means, NP      . menthol-cetylpyridinium (CEPACOL) lozenge 3 mg  1 lozenge Oral PRN Fransisca Kaufmann, NP   3 mg at 12/14/14 0802  . ondansetron (ZOFRAN) tablet 4 mg  4 mg Oral Q8H PRN Nanine Means, NP   4 mg at 12/08/14 2130  . oxyCODONE-acetaminophen (PERCOCET/ROXICET) 5-325 MG per tablet 1 tablet  1 tablet Oral Q6H PRN Craige Cotta, MD   1 tablet at 12/10/14 2007  . propranolol (INDERAL) tablet  10 mg  10 mg Oral BID Lindwood Qua, NP   10 mg at 12/15/14 0818  . risperiDONE (RISPERDAL) tablet 1 mg  1 mg Oral Daily Craige Cotta, MD   1 mg at 12/15/14 0819  . risperiDONE (RISPERDAL) tablet 2 mg  2 mg Oral QHS Craige Cotta, MD   2 mg at 12/14/14 2114  . sodium chloride (OCEAN) 0.65 % nasal spray 1 spray  1 spray Each Nare PRN Craige Cotta, MD   1 spray at 12/15/14 0559  . topiramate (TOPAMAX) tablet 25 mg  25 mg Oral BID Craige Cotta, MD   25 mg at 12/15/14 1610    Lab Results:  No results found for this or any previous visit (from the past 48 hour(s)).  Physical Findings: AIMS: Facial and Oral Movements Muscles of Facial Expression: None, normal Lips and Perioral Area: None, normal Jaw: None, normal Tongue: None, normal,Extremity Movements Upper (arms, wrists, hands, fingers): None, normal Lower (legs, knees, ankles, toes): None, normal, Trunk Movements Neck, shoulders, hips: None, normal, Overall Severity Severity of abnormal movements (highest score from questions above): None, normal Incapacitation due to abnormal movements: None, normal Patient's awareness of abnormal movements (rate only patient's report): No Awareness, Dental Status Current problems with teeth and/or dentures?: No Does patient usually wear dentures?: No  CIWA:  CIWA-Ar Total: 1 COWS:  COWS Total Score: 2   Assessment-   Patient is calm and cooperative. Gait is stable, no orthostasis, ambulating in halls. Reports somnolence, continue current medications and doses and  monitor  Plan at this time is for patient to be transferred to either [facility] - when bed becomes available, or to [facility], for inpatient ECT treatment.    Treatment Plan Summary: Daily contact with patient to assess and evaluate symptoms and progress in treatment, Medication management, Plan continue inpatient treatment and continue medication management as below  Continue  Risperidone   To 1  mgrs  QAM and 2  mgrs QHS  Cogentin 0.5 mgrs BID  Celexa 20 mgrs QDAY  Topamax to  25 mgrs  BID  Ativan 1 mgr Q 8 hours for severe anxiety or agitation Percocet 5/325 mgr Q 6 hours PRN severe headache  Proceeding with transfer options as above- SW has sent information and we are awaiting acceptance/bed availability. Patient and husband are currently agreeing to ECT .   Review of chart, vital signs, medications and notes Daily contact with the patient to assess and evaluate synmptoms and progress in treatment  1. Continue crisis management and stabilization. Estimated length of stay 5-7 days  2.  Medication management to reduce current symptoms to base line and improve patient's overall level of functioning -Medications reviewed with the pateint and no untoward effects - Individual and group therapy encouraged -Coping skills for depression, -discussed finding joy and gratitude as coping skill  3.  Treat health problems as indicated. 4.  Develop treatment plan to decrease risk of relapse upon discharge and the need for readmission 5.  Psych-social education regarding relapse prevention and self care. 6.  Health care follow up as needed for medical problems 7.  Continue home medications where appropriate 8.  Disposition in progress  Medical Decision Making:  Review of Psycho-Social Stressors (1), Review or order clinical lab tests (1), Review of Medication Regimen & Side Effects (2) and Review of New Medication or Change in Dosage (2)  -patient c/o feeling sleepy from  medications, educated that this should start  to subside   [facility], [facility]  PMHNP 12/15/2014, 10:58 AM   I have personally seen the patient and agreed with the findings and involved in the treatment plan. Kathryne Sharper, MD

## 2014-12-15 NOTE — B[facility] Group Notes (Signed)
Adult Psychoeducational Group Note  Date:  12/15/2014 Time:  1000  Group Topic/Focus:  The focus of this group is to help patients identify 2 things they are most grateful for in their lives.  What helps ground them and to center them on their work to recovery.  Participation Level:  Active  Participation Quality:  Appropriate  Affect:  Flat  Cognitive:  Alert  Insight: Improving  Engagement in Group:  Engaged  Modes of Intervention:   Had interpreter.  Additional Comments:    Earline MayotteKnight, Gavriel Holzhauer Shephard 12/15/2014, 3:41 PM

## 2014-12-15 NOTE — Progress Notes (Addendum)
0830  Patient walking in hallway with 1:1 present, has been sitting in dayroom.  Denied SI and HI.  Denied A/V hallucinations.  Patient has been smiling and talking occasionally.  Respirations even and unlabored.  No signs/symptoms of pain/distress noted on patient's face/body movements.  Safety maintained with 1:1 per MD order.  1100  Patient talking to MD at this time.  Patient has been to morning group with interpreter present.  Denied SI and HI.  Denied A/V hallucinations.  Denied pain.  Smiling and talking this morning.  Respirations even and unlabored.  No signs/symptoms of pain/distress noted on patient's face/body movements.  Safety maintained with 1:1 present.

## 2014-12-15 NOTE — Progress Notes (Addendum)
D:  Patient's self inventory sheet, patient slept good, no sleep medication needed.  Fair appetite, normal energy level, good concentration.  Rated depression 5, hopeless 3.  Denied withdrawals.  Denied SI thoughts.  Denied physical problems.  Elbow pain, slept wrong.  No pain medication needed.  Goal is to talk to MD.  Plan is to talk to MD.  Does have discharge plans.  No problems anticipated after discharge. A:  Medications administered per MD orders.  Emotional support and encouragement given patient. R:  Denied SI and HI, contracts for safety.  Denied A/V hallucinations.  Safety maintained with 15 minute checks. 1:1 has been discontinued.  Patient in group with interpreter.  Patient went to dining room for lunch.   1430  Patient's interpreter left.  Charge nurse informed.  Interpreter will return tonight approximately 7:30 p.m.  Interpreter stated she can be reached at phone [phone] any time and will return if needed, Amina Tahirou.  Patient stated she did not need interpreter at this time, since there were no groups.  Patient denied SI and HI, contracts for safety.  Denied A/V hallucinations.  No pain.  Safety maintained with 15 minute checks.

## 2014-12-15 NOTE — Progress Notes (Signed)
[facility] Post 1:1 Observation Documentation  For the first (8) hours following discontinuation of 1:1 precautions, a progress note entry by nursing staff should be documented at least every 2 hours, reflecting the patient's behavior, condition, mood, and conversation.  Use the progress notes for additional entries.  Time 1:1 discontinued:  1201  Patient's Behavior:  appropriate  Patient's Condition:  Appropriate, sitting in group with interpreter  Patient's Conversation:  Patient has denied SI and HI.  Denied A/V hallucinations.  Denied pain.  Feels good that her 1:1 has been discontinued.  Catherine Gomez, Catherine Gomez 12/15/2014, 1:50 PM

## 2014-12-15 NOTE — Progress Notes (Signed)
[facility] Post 1:1 Observation Documentation  For the first (8) hours following discontinuation of 1:1 precautions, a progress note entry by nursing staff should be documented at least every 2 hours, reflecting the patient's behavior, condition, mood, and conversation.  Use the progress notes for additional entries.  Time 1:1 discontinued:  1201  Patient's Behavior:  Appropriate  Patient's Condition:  Respirations even and unlabored.  No signs/symptoms of pain/distress noted on patient's face/body movements.  Patient's Conversation:  Patient asked about drawings on wall of dayroom.  Stated she does not want to color at this time.  Denied SI and HI.  Denied A/V hallucinations.  Denied pain.  Stated she is feeling better today.  Catherine Gomez, Catherine Gomez 12/15/2014, 4:02 PM

## 2014-12-15 NOTE — Progress Notes (Signed)
1:1 Nursing Note: Patient lying in bed asleep.Snoring. Respiration even and unlabored. No distress noted. Will continue to monitor patient for safety. Remains on 1:1 for safety.

## 2014-12-15 NOTE — Progress Notes (Signed)
[facility] Post 1:1 Observation Documentation  For the first (8) hours following discontinuation of 1:1 precautions, a progress note entry by nursing staff should be documented at least every 2 hours, reflecting the patient's behavior, condition, mood, and conversation.  Use the progress notes for additional entries.  Time 1:1 discontinued:  1201  Patient's Behavior:  Appropriate  Patient's Condition:  No signs/symptoms of pain/distress noted on patient's face/body movement.  Respirations even and unlabored.  Patient's Conversation:   Patient went to dining room for dinner.  Stated she was feeling better today.  Denied SI and HI, contracts for safety.  Denied A/V hallucinations.  Denied pain.  Catherine Gomez, Catherine Gomez 12/15/2014, 6:01 PM

## 2014-12-15 NOTE — Progress Notes (Signed)
Adult Psychoeducational Group Note  Date:  12/15/2014 Time:  11:44 PM  Group Topic/Focus:  Wrap-Up Group:   The focus of this group is to help patients review their daily goal of treatment and discuss progress on daily workbooks.  Participation Level:  Minimal  Participation Quality:  Resistant  Affect:  Resistant  Cognitive:  Lacking  Insight: Improving  Engagement in Group:  Limited  Modes of Intervention:  Discussion, Education and Support  Additional Comments:  Pt attended group with her translator. Pt shared that she had a good day because she did not have a headache, but did not want to share anymore information.  Malachy MoanJeffers, Edrees Valent S 12/15/2014, 11:44 PM

## 2014-12-16 ENCOUNTER — Encounter (HOSPITAL_COMMUNITY): Payer: Self-pay | Admitting: Psychiatry

## 2014-12-16 DIAGNOSIS — G43009 Migraine without aura, not intractable, without status migrainosus: Secondary | ICD-10-CM

## 2014-12-16 DIAGNOSIS — G43909 Migraine, unspecified, not intractable, without status migrainosus: Secondary | ICD-10-CM | POA: Insufficient documentation

## 2014-12-16 MED ORDER — PROPRANOLOL HCL 10 MG PO TABS: 10.0000 mg | ORAL_TABLET | Freq: Two times a day (BID) | ORAL | Status: AC

## 2014-12-16 MED ORDER — TOPIRAMATE 25 MG PO TABS: 25.0000 mg | ORAL_TABLET | Freq: Two times a day (BID) | ORAL | Status: DC

## 2014-12-16 MED ORDER — CITALOPRAM HYDROBROMIDE 20 MG PO TABS: 20.0000 mg | ORAL_TABLET | Freq: Every day | ORAL | Status: AC

## 2014-12-16 MED ORDER — ETONOGESTREL 68 MG ~~LOC~~ IMPL: DRUG_IMPLANT | SUBCUTANEOUS | Status: DC

## 2014-12-16 MED ORDER — RISPERIDONE 1 MG PO TABS: 1.0000 mg | ORAL_TABLET | Freq: Two times a day (BID) | ORAL | Status: AC

## 2014-12-16 MED ORDER — BENZTROPINE MESYLATE 0.5 MG PO TABS: 0.5000 mg | ORAL_TABLET | Freq: Two times a day (BID) | ORAL | Status: DC

## 2014-12-16 NOTE — Clinical Social Work Note (Signed)
CSW along with MD met with patient and interpreter. Patient reports that she is feeling better today and reports that her headaches have improved. She rates her depression at a 5 today and denies SI. She reports wanting to discharge home and declines ECT treatment at [facility] at this time as well as declines further treatment at [facility]. CSW spoke with husband who feels that patient is safe to return home to follow up with ACTT team through [facility]. Husband agreeable to pick her up around 2 or 3 pm. MD aware. CSW contacted [facility] to inform them of patient's d/c today and to cancel direct admit scheduled for today.  Tilden Fossa, MSW, [place] Worker [facility] [phone]

## 2014-12-16 NOTE — [facility] Suicide Risk Assessment (Signed)
[facility]   Demographic Factors:  NA  Total Time spent with patient: 30 minutes  Musculoskeletal: Strength & Muscle Tone: within normal limits Gait & Station: normal Patient leans: N/A  Psychiatric Specialty Exam: Physical Exam  Review of Systems  Neurological: Positive for headaches (chronic).  Psychiatric/Behavioral: Positive for depression (stable). Negative for suicidal ideas.    Blood pressure 114/68, pulse 113, temperature 97.6 F (36.4 C), temperature source Oral, resp. rate 16, height 5' 4.25" (1.632 m), weight 68.947 kg (152 lb), SpO2 100 %, not currently breastfeeding.Body mass index is 25.89 kg/(m^2).  General Appearance: Fairly Groomed  Patent attorneyye Contact::  Fair  Speech:  Normal [id number]  Volume:  Normal  Mood:  Depressed  Affect:  Appropriate  Thought Process:  Coherent  Orientation:  Full (Time, Place, and Person)  Thought Content:  WDL  Suicidal Thoughts:  No  Homicidal Thoughts:  No  Memory:  Immediate;   Fair Recent;   Fair Remote;   Fair  Judgement:  Fair  Insight:  Fair  Psychomotor Activity:  Normal  Concentration:  Fair  Recall:  Fis[employer]Fair  Fund of Knowledge:Fair  Language: Fair  Akathisia:  No  Handed:  Right  AIMS (if indicated):     Assets:  Social Support  Sleep:  Number of Hours: 4  Cognition: WNL  ADL's:  Intact   Have you used any form of tobacco in the last 30 days? (Cigarettes, Smokeless Tobacco, Cigars, and/or Pipes): No  Has this patient used any form of tobacco in the last 30 days? (Cigarettes, Smokeless Tobacco, Cigars, and/or Pipes) No  Mental Status Per Nursing Assessment::   On Admission:  NA  Current Mental Status by Physician: patient reports improvement in her depression,denies SI/HI/AH/VH  Loss Factors: NA  Historical Factors: NA  Risk Reduction Factors:   Positive social support  Continued Clinical Symptoms:  Depression: STABLE  Cognitive Features That Contribute To Risk:  None     Suicide Risk:  Minimal: No identifiable suicidal ideation.  Patients presenting with no risk factors but with morbid ruminations; may be classified as minimal risk based on the severity of the depressive symptoms  Principal Problem: Major depressive disorder, single episode, severe without psychotic features (improved) Discharge Diagnoses:  Patient Active Problem List   Diagnosis Date Noted  . Major depressive disorder, single episode, severe without psychotic features [F32.2]   . Headache [R51] 07/16/2014      Plan Of Care/Follow-up recommendations:  Activity:  NO RESTRICTIONS Diet:  REGULAR Tests:  AS NEEDED Other:  FOLLOW UP AFTER CARE AS SCHEDULED  Is patient on multiple antipsychotic therapies at discharge:  No   Has Patient had three or more failed trials of antipsychotic monotherapy by history:  No  Recommended Plan for Multiple Antipsychotic Therapies: NA    Jlyn Cerros md 12/16/2014, 10:27 AM

## 2014-12-16 NOTE — Discharge Summary (Signed)
Physician Discharge Summary Note  Patient:  Catherine Gomez is an 24 y.o., female MRN:  [id number] DOB:  08-Dec-1990 Patient phone:  [phone] (home)  Patient address:   [address] [place] [address],  Total Time spent with patient: 30 minutes  Date of Admission:  12/04/2014 Date of Discharge: 12/16/2014  Reason for Admission:  depression  Principal Problem: Major depressive disorder, single episode, severe without psychotic features Discharge Diagnoses: Patient Active Problem List   Diagnosis Date Noted  . Migraine [G43.909]   . Major depressive disorder, single episode, severe without psychotic features [F32.2]   . Headache [R51] 07/16/2014   Musculoskeletal: Strength & Muscle Tone: within normal limits Gait & Station: normal Patient leans: N/A  Psychiatric Specialty Exam: Physical Exam  Vitals reviewed. Psychiatric: Her mood appears anxious. She exhibits a depressed mood.    Review of Systems  Constitutional: Negative.   HENT: Negative.   Eyes: Negative.   Respiratory: Negative.   Cardiovascular: Negative.   Genitourinary: Negative.   Musculoskeletal: Negative.   Skin: Negative.   Neurological: Negative.   Endo/Heme/Allergies: Negative.   Psychiatric/Behavioral: The patient is nervous/anxious.     Blood pressure 114/68, pulse 113, temperature 97.6 F (36.4 C), temperature source Oral, resp. rate 16, height 5' 4.25" (1.632 m), weight 68.947 kg (152 lb), SpO2 100 %, not currently breastfeeding.Body mass index is 25.89 kg/(m^2).   General Appearance: Fairly Groomed  Patent attorney:: Fair  Speech: Normal [id number]  Volume: Normal  Mood: Depressed  Affect: Appropriate  Thought Process: Coherent  Orientation: Full (Time, Place, and Person)  Thought Content: WDL  Suicidal Thoughts: No  Homicidal Thoughts: No  Memory: Immediate; Fair Recent; Fair Remote; Fair  Judgement: Fair  Insight: Fair  Psychomotor Activity:  Normal  Concentration: Fair  Recall: [employer] of Knowledge:Fair  Language: Fair  Akathisia: No  Handed: Right  AIMS (if indicated):    Assets: Social Support  Sleep: Number of Hours: 4  Cognition: WNL  ADL's: Intact       Past Medical History:  Past Medical History  Diagnosis Date  . Extended spectrum beta lactamase (ESBL) resistance   . Headache(784.0)   . Depression     Past Surgical History  Procedure Laterality Date  . No past surgeries     Family History:  Family History  Problem Relation Age of Onset  . Alcohol abuse Neg Hx   . Arthritis Neg Hx   . Asthma Neg Hx   . Birth defects Neg Hx   . Cancer Neg Hx   . COPD Neg Hx   . Depression Neg Hx   . Diabetes Neg Hx   . Drug abuse Neg Hx   . Early death Neg Hx   . Hearing loss Neg Hx   . Heart disease Neg Hx   . Hyperlipidemia Neg Hx   . Hypertension Neg Hx   . Kidney disease Neg Hx   . Learning disabilities Neg Hx   . Mental illness Neg Hx   . Mental retardation Neg Hx   . Miscarriages / Stillbirths Neg Hx   . Stroke Neg Hx   . Vision loss Neg Hx    Social History:  History  Alcohol Use No     History  Drug Use No    History   Social History  . Marital Status: Married    Spouse Name: N/A  . Number of Children: N/A  . Years of Education: N/A   Social History  Main Topics  . Smoking status: Never Smoker   . Smokeless tobacco: Never Used  . Alcohol Use: No  . Drug Use: No  . Sexual Activity: Yes   Other Topics Concern  . None   Social History Narrative    Risk to Self: Is patient at risk for suicide?: Yes What has been your use of drugs/alcohol within the last 12 months?: Denies Risk to Others:   Prior Inpatient Therapy:   Prior Outpatient Therapy:    Level of Care:  OP  Hospital Course:   Catherine Gomez is a 24 year old patient who came in with psychosis. Per ED report states that she wanted to hurt herself and her children. She was seen today. An  interpreter was used to interview patient today. She states she is happily married. [place] and she speaks little [place]. [facility], speaking/uttering sounds. She is also known to the ED for severe headache presentation. She states that she "gets this way when she experiences severe and dizzying headaches". They are intermittent and comes on suddenly. She denies any form of depression. She endorses hearing voices especially in her sleep.   Catherine Gomez was admitted for Major depressive disorder, single episode, severe without psychotic features and crisis management.  She was treated discharged with the medications listed below under Medication List.  Medical problems were identified and treated as needed.  Home medications were restarted as appropriate.  Improvement was monitored by observation and Catherine Gomez daily report of symptom reduction.  Emotional and mental status was monitored by daily self-inventory reports completed by Catherine Gomez and clinical staff.         Catherine Gomez was evaluated by the treatment team for stability and plans for continued recovery upon discharge.  Catherine Gomez motivation was an integral factor for scheduling further treatment.  Employment, transportation, bed availability, health status, family support, and any pending legal issues were also considered during her hospital stay.  She was offered further treatment options upon discharge including but not limited to Residential, Intensive Outpatient, and Outpatient treatment.  Catherine Gomez will follow up with the services as listed below under Follow Up Information.     Upon completion of this admission the patient was both mentally and medically stable for discharge denying suicidal/homicidal ideation, auditory/visual/tactile hallucinations, delusional thoughts and paranoia.      Consults:  psychiatry  Significant  Diagnostic Studies:  labs: per ED  Discharge Vitals:   Blood pressure 114/68, pulse 113, temperature 97.6 F (36.4 C), temperature source Oral, resp. rate 16, height 5' 4.25" (1.632 m), weight 68.947 kg (152 lb), SpO2 100 %, not currently breastfeeding. Body mass index is 25.89 kg/(m^2). Lab Results:   No results found for this or any previous visit (from the past 72 hour(s)).  Physical Findings: AIMS: Facial and Oral Movements Muscles of Facial Expression: None, normal Lips and Perioral Area: None, normal Jaw: None, normal Tongue: None, normal,Extremity Movements Upper (arms, wrists, hands, fingers): None, normal Lower (legs, knees, ankles, toes): None, normal, Trunk Movements Neck, shoulders, hips: None, normal, Overall Severity Severity of abnormal movements (highest score from questions above): None, normal Incapacitation due to abnormal movements: None, normal Patient's awareness of abnormal movements (rate only patient's report): No Awareness, Dental Status Current problems with teeth and/or dentures?: No Does patient usually wear dentures?: No  CIWA:  CIWA-Ar Total: 1 COWS:  COWS Total Score: 3   See Psychiatric Specialty  Exam and Suicide Risk Assessment completed by Attending Physician prior to discharge.  Discharge destination:  Home  Is patient on multiple antipsychotic therapies at discharge:  No   Has Patient had three or more failed trials of antipsychotic monotherapy by history:  No  Recommended Plan for Multiple Antipsychotic Therapies: NA    Medication List    STOP taking these medications        buPROPion 150 MG 24 hr tablet  Commonly known as:  WELLBUTRIN XL     ibuprofen 600 MG tablet  Commonly known as:  ADVIL,MOTRIN     ibuprofen 800 MG tablet  Commonly known as:  ADVIL,MOTRIN     lithium carbonate 150 MG capsule     lithium carbonate 300 MG CR tablet  Commonly known as:  LITHOBID     ondansetron 4 MG tablet  Commonly known as:  ZOFRAN       TAKE these medications      Indication   benztropine 0.5 MG tablet  Commonly known as:  COGENTIN  Take 1 tablet (0.5 mg total) by mouth 2 (two) times daily.   Indication:  Extrapyramidal Reaction caused by Medications     citalopram 20 MG tablet  Commonly known as:  CELEXA  Take 1 tablet (20 mg total) by mouth daily.   Indication:  Depression     etonogestrel 68 MG Impl implant  Commonly known as:  NEXPLANON  Take as previously prescribed by outside provider.   Indication:  Pregnancy     propranolol 10 MG tablet  Commonly known as:  INDERAL  Take 1 tablet (10 mg total) by mouth 2 (two) times daily.   Indication:  Migraine Headache     risperiDONE 1 MG tablet  Commonly known as:  RISPERDAL  Take 1 tablet (1 mg total) by mouth 2 (two) times daily. Take 1 tablet (1 mg) in the morning.  Take 2 tabs (2 mg) in the evening.   Indication:  Easily Angered or Annoyed, psychosis     topiramate 25 MG tablet  Commonly known as:  TOPAMAX  Take 1 tablet (25 mg total) by mouth 2 (two) times daily.   Indication:  Migraine Headache           Follow-up Information    Follow up with [facility] On 12/16/2014.   Why:  Please resume services with your ACTT team at discharge.   Contact information:   [address] [place] [address] [phone] fax [phone]      Follow-up recommendations:  Activity:   as tol, diet as tol  Comments:  1.  Take all your medications as prescribed.              2.  Report any adverse side effects to outpatient provider.                       3.  Patient instructed to not use alcohol or illegal drugs while on prescription medicines.            4.  In the event of worsening symptoms, instructed patient to call 911, the crisis hotline or go to nearest emergency room for evaluation of symptoms.  Total Discharge Time:  30 min  Signed: Adonis Brook AGNP-BC 12/16/2014, 6:10 PM

## 2014-12-16 NOTE — [facility] Group Notes (Signed)
   [facility] Note  12/16/2014  8:45 AM   Participation Quality: Alert, Appropriate and Oriented  Mood/Affect: Depressed and Flat  Depression Rating: 5  Anxiety Rating: Did not rate  Thoughts of Suicide: Pt denies SI/HI  Will you contract for safety? Yes  Current AVH: Pt denies  Plan for Discharge/Comments: Pt attended discharge planning group and actively participated in group. CSW provided pt with today's workbook. With use of interpreter, patient reports feeling "better" today and reports a lack of headaches which she has been complaining of since admission.   Transportation Means: Pt reports access to transportation  Supports: No supports mentioned at this time  Samuella BruinKristin Maribell Demeo, MSW, [employer]CSWA Clinical Social Worker [facility] [phone]

## 2014-12-16 NOTE — Progress Notes (Signed)
D: Patient more cheerful. Seen interacting with peers and watching TV on day room. Minimally participated in group. C/o cold and pain - patient seen shivering. Denies SI, AH/VH.  A: Support and encouragement offered to patient. Due medications given as ordered. Safety maintained at all times. Will continue to monitor and observe patient for safety and stability. R: Patient receptive to nursing intervention.

## 2014-12-16 NOTE — [facility] Suicide Risk Assessment (Signed)
[facility] INPATIENT:  Family/Significant Other Suicide Prevention Education  Suicide Prevention Education:  Education Completed; husband Catherine Gomez [phone],  (name of family member/significant other) has been identified by the patient as the family member/significant other with whom the patient will be residing, and identified as the person(s) who will aid the patient in the event of a mental health crisis (suicidal ideations/suicide attempt).  With written consent from the patient, the family member/significant other has been provided the following suicide prevention education, prior to the and/or following the discharge of the patient.  The suicide prevention education provided includes the following:  Suicide risk factors  Suicide prevention and interventions  National Suicide Hotline telephone number  [facility] assessment telephone number  [facility] 911  [facility] and/or Residential Mobile Crisis Unit telephone number  Request made of family/significant other to:  Remove weapons (e.g., guns, rifles, knives), all items previously/currently identified as safety concern.    Remove drugs/medications (over-the-counter, prescriptions, illicit drugs), all items previously/currently identified as a safety concern.  The family member/significant other verbalizes understanding of the suicide prevention education information provided.  The family member/significant other agrees to remove the items of safety concern listed above.  Diem Pagnotta, [place] CarboKristin L 12/16/2014, 10:57 AM

## 2014-12-16 NOTE — Progress Notes (Signed)
  [facility] Discharge Plan :  Will you be returning to the same living situation after discharge:  Yes,  Patient will return home with her husband At discharge, do you have transportation home?: Yes,  patient reports access to transportation by husband Do you have the ability to pay for your medications: Yes,  patient will be provided medication samples and prescriptions at discharge  Release of information consent forms completed and in the chart;  Patient's signature needed at discharge.  Patient to Follow up at: Follow-up Information    Follow up with [facility] On 12/16/2014.   Why:  Please resume services with your ACTT team at discharge.   Contact information:   [address] [place] [address] [phone] fax [phone]      Patient denies SI/HI: Yes,  denies    Safety Planning and Suicide Prevention discussed: Yes,  with patient and husband  Have you used any form of tobacco in the last 30 days? (Cigarettes, Smokeless Tobacco, Cigars, and/or Pipes): No  Has patient been referred to the Quitline?: N/A patient is not a smoker  Rosey Eide L 12/16/2014, 11:53 AM

## 2014-12-16 NOTE — Progress Notes (Addendum)
Pt appears more brighter today and appears in good spirits. Pt does not want to have ECT treatments per MD. Pt does contract for safety and presently has her interpreter with her. Pt has been going to groups.Pt was encouraged to drink po  And push fluids. Pt has an interpreter with her this am. Presently she is in the dayroom watching TV-Pt stated her depression is a 3/10. Anxiety is a 7/10, Pt would like to leave the hospital and feel better. `11:45am-Pt will be discharged today and husband will pick her up today.

## 2014-12-19 NOTE — Progress Notes (Signed)
Patient Discharge Instructions:  No documentation was faxed to [facility] for HBIPS.  Per the SW/ROI the information was already sent.  Jerelene ReddenSheena E [place], 12/19/2014, 1:29 PM

## 2015-03-12 ENCOUNTER — Ambulatory Visit (INDEPENDENT_AMBULATORY_CARE_PROVIDER_SITE_OTHER): Payer: Medicaid Other | Admitting: Diagnostic Neuroimaging

## 2015-03-12 ENCOUNTER — Encounter: Payer: Self-pay | Admitting: Diagnostic Neuroimaging

## 2015-03-12 VITALS — BP 106/72 | HR 82 | Ht 64.0 in | Wt 149.4 lb

## 2015-03-12 DIAGNOSIS — G43111 Migraine with aura, intractable, with status migrainosus: Secondary | ICD-10-CM | POA: Diagnosis not present

## 2015-03-12 NOTE — Progress Notes (Signed)
[facility]  PATIENT: Catherine Gomez DOB: 1991/01/06  REFERRING CLINICIAN: Dr. Omelia Blackwater HISTORY FROM: patient, husband (who translates), ACT team member REASON FOR VISIT: new consult   HISTORICAL  CHIEF COMPLAINT:  Chief Complaint  Patient presents with  . New Evaluation    persistent headaches     HISTORY OF PRESENT ILLNESS:   24 year old female here for evaluation of intractable headaches. Patient is originally from [place] and came to [place] after marriage. 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 She required behavioral health hospitalization, ECT treatments and multiple medications. Around this time she also developed significant headaches. She describes global severe throbbing headaches with sensitivity to light and sound, nausea, neck pain and dizziness. She never had these types of headaches before. She has almost daily headaches, with exacerbation several times per week. She has limited social support.  She has tried propranolol 10 mg twice daily without relief. She is also on topiramate 25 mg twice a day.   REVIEW OF SYSTEMS: Full 14 system review of systems performed and notable only for only as per HPI. Other wise negative.  ALLERGIES: Allergies  Allergen Reactions  . Other Hives and Itching    Depression med (name unknown)    HOME MEDICATIONS: Outpatient Prescriptions Prior to Visit  Medication Sig Dispense Refill  . citalopram (CELEXA) 20 MG tablet Take 1 tablet (20 mg total) by mouth daily. 30 tablet 0  . propranolol (INDERAL) 10 MG tablet Take 1 tablet (10 mg total) by mouth 2 (two) times daily. 60 tablet 0  . risperiDONE (RISPERDAL) 1 MG tablet Take 1 tablet (1 mg total) by mouth 2 (two) times daily. Take 1 tablet (1 mg) in the morning.  Take 2 tabs (2 mg) in the evening. 90 tablet 0  . benztropine (COGENTIN) 0.5 MG tablet Take 1  tablet (0.5 mg total) by mouth 2 (two) times daily. 60 tablet 0  . etonogestrel (NEXPLANON) 68 MG IMPL implant Take as previously prescribed by outside provider. 1 each 0  . topiramate (TOPAMAX) 25 MG tablet Take 1 tablet (25 mg total) by mouth 2 (two) times daily. 60 tablet 0   No facility-administered medications prior to visit.    PAST MEDICAL HISTORY: Past Medical History  Diagnosis Date  . Extended spectrum beta lactamase (ESBL) resistance   . Headache(784.0)   . Depression     PAST SURGICAL HISTORY: Past Surgical History  Procedure Laterality Date  . No past surgeries      FAMILY HISTORY: Family History  Problem Relation Age of Onset  . Alcohol abuse Neg Hx   . Arthritis Neg Hx   . Asthma Neg Hx   . Birth defects Neg Hx   . Cancer Neg Hx   . COPD Neg Hx   . Depression Neg Hx   . Diabetes Neg Hx   . Drug abuse Neg Hx   . Early death Neg Hx   . Hearing loss Neg Hx   . Heart disease Neg Hx   . Hyperlipidemia Neg Hx   . Hypertension Neg Hx   . Kidney disease Neg Hx   . Learning disabilities Neg Hx   . Mental illness Neg Hx   . Mental retardation Neg Hx   . Miscarriages / Stillbirths Neg Hx   . Stroke Neg Hx   . Vision loss Neg Hx     SOCIAL HISTORY:  History   Social  History  . Marital Status: Married    Spouse Name: Ibounou Maiga  . Number of Children: 2  . Years of Education: 6th   Occupational History  . Not on file.   Social History Main Topics  . Smoking status: Never Smoker   . Smokeless tobacco: Never Used  . Alcohol Use: No  . Drug Use: No  . Sexual Activity: Yes   Other Topics Concern  . Not on file   Social History Narrative   Lives at home with husband and two daughters    No caffeine      PHYSICAL EXAM  Filed Vitals:   03/12/15 0922  BP: 106/72  Pulse: 82  Height: 5\' 4"  (1.626 m)  Weight: 149 lb 6.4 oz (67.767 kg)    Body mass index is 25.63 kg/(m^2).  No exam data present  No flowsheet data found.  GENERAL  EXAM: Patient is in no distress; well developed, nourished and groomed; neck is supple; SOFT SPOKEN  CARDIOVASCULAR: Regular rate and rhythm, no murmurs, no carotid bruits  NEUROLOGIC: MENTAL STATUS: awake, alert, oriented to person, place and time, recent and remote memory intact, normal attention and concentration, language fluent, comprehension intact, naming intact, fund of knowledge appropriate; PER TRANSLATION VIA HUSBAND CRANIAL NERVE: no papilledema on fundoscopic exam, pupils equal and reactive to light, visual fields full to confrontation, extraocular muscles intact, no nystagmus, facial sensation and strength symmetric, hearing intact, palate elevates symmetrically, uvula midline, shoulder shrug symmetric, tongue midline. MOTOR: normal bulk and tone, full strength in the BUE, BLE SENSORY: normal and symmetric to light touch, pinprick, temperature, vibration  COORDINATION: finger-nose-finger, fine finger movements normal REFLEXES: deep tendon reflexes present and symmetric GAIT/STATION: narrow based gait; able to walk on toes, heels and tandem; romberg is negative    DIAGNOSTIC DATA (LABS, IMAGING, TESTING) - I reviewed patient records, labs, notes, testing and imaging myself where available.  Lab Results  Component Value Date   WBC 7.9 12/03/2014   HGB 11.9* 12/03/2014   HCT 38.3 12/03/2014   MCV 83.8 12/03/2014   PLT 219 12/03/2014      Component Value Date/Time   NA 142 12/13/2014 0712   K 3.9 12/13/2014 0712   CL 113* 12/13/2014 0712   CO2 24 12/13/2014 0712   GLUCOSE 92 12/13/2014 0712   BUN 21 12/13/2014 0712   CREATININE 0.80 12/13/2014 0712   CALCIUM 8.9 12/13/2014 0712   PROT 8.7* 12/03/2014 1423   ALBUMIN 4.7 12/03/2014 1423   AST 17 12/03/2014 1423   ALT 15 12/03/2014 1423   ALKPHOS 83 12/03/2014 1423   BILITOT 0.8 12/03/2014 1423   GFRNONAA >90 12/13/2014 0712   GFRAA >90 12/13/2014 0712   Lab Results  Component Value Date   CHOL 159 12/13/2014     HDL 39* 12/13/2014   LDLCALC 112* 12/13/2014   TRIG 38 12/13/2014   CHOLHDL 4.1 12/13/2014   Lab Results  Component Value Date   HGBA1C 5.7* 12/13/2014   No results found for: VITAMINB12 Lab Results  Component Value Date   TSH 2.080 04/07/2014    12/11/14 MRI brain - Negative exam. No acute or focal intracranial abnormality. [I reviewed images myself and agree with interpretation. -VRP]     ASSESSMENT AND PLAN  24 y.o. year old female here with intractable headaches, in setting of significant post-partum depression.  Dx: Intractable migraine with aura with status migrainosus    PLAN: - consider stopping propranolol (low dose, not been effective, can potentially  aggravate depression) - consider starting one of the following: amitriptyline, nortriptyline, topiramate or gabapentin - consider depacon  IV x 1 infusion to help break migraine cycle - headache education reviewed (nutrition, exercise, triggers, stress mgmt, sleep)  Return in about 3 months (around 06/12/2015).    Suanne Marker, MD 03/12/2015, 10:19 AM Certified in Neurology, Neurophysiology and Neuroimaging  [facility] [address] [place], [place] [address] [phone] a

## 2015-03-12 NOTE — Patient Instructions (Signed)
Consider amitriptyline, nortriptyline, topiramate or gabapentin.

## 2015-03-18 ENCOUNTER — Encounter: Payer: Self-pay | Admitting: Diagnostic Neuroimaging

## 2015-03-19 ENCOUNTER — Emergency Department (HOSPITAL_COMMUNITY)
Admission: EM | Admit: 2015-03-19 | Discharge: 2015-03-20 | Disposition: A | Discharge status: Home / Self Care | Payer: Medicaid Other | Attending: Emergency Medicine | Admitting: Emergency Medicine

## 2015-03-19 ENCOUNTER — Encounter (HOSPITAL_COMMUNITY): Payer: Self-pay | Admitting: Emergency Medicine

## 2015-03-19 DIAGNOSIS — F329 Major depressive disorder, single episode, unspecified: Secondary | ICD-10-CM | POA: Diagnosis not present

## 2015-03-19 DIAGNOSIS — R1110 Vomiting, unspecified: Secondary | ICD-10-CM | POA: Diagnosis not present

## 2015-03-19 DIAGNOSIS — Z3202 Encounter for pregnancy test, result negative: Secondary | ICD-10-CM | POA: Insufficient documentation

## 2015-03-19 DIAGNOSIS — H53149 Visual discomfort, unspecified: Secondary | ICD-10-CM | POA: Insufficient documentation

## 2015-03-19 DIAGNOSIS — R51 Headache: Secondary | ICD-10-CM | POA: Diagnosis not present

## 2015-03-19 DIAGNOSIS — G43009 Migraine without aura, not intractable, without status migrainosus: Secondary | ICD-10-CM

## 2015-03-19 DIAGNOSIS — Z79899 Other long term (current) drug therapy: Secondary | ICD-10-CM | POA: Diagnosis not present

## 2015-03-19 LAB — URINALYSIS, ROUTINE W REFLEX MICROSCOPIC
BILIRUBIN URINE: NEGATIVE
GLUCOSE, UA: NEGATIVE mg/dL
Hgb urine dipstick: NEGATIVE
Ketones, ur: NEGATIVE mg/dL
NITRITE: NEGATIVE
PH: 5.5 (ref 5.0–8.0)
Protein, ur: NEGATIVE mg/dL
Specific Gravity, Urine: 1.037 — ABNORMAL HIGH (ref 1.005–1.030)
UROBILINOGEN UA: 0.2 mg/dL (ref 0.0–1.0)

## 2015-03-19 LAB — URINE MICROSCOPIC-ADD ON

## 2015-03-19 LAB — PREGNANCY, URINE: Preg Test, Ur: NEGATIVE

## 2015-03-19 MED ORDER — KETOROLAC TROMETHAMINE 30 MG/ML IJ SOLN
30.0000 mg | Freq: Once | INTRAMUSCULAR | Status: AC
  Administered 2015-03-19: 30 mg via INTRAVENOUS
  Filled 2015-03-19: qty 1

## 2015-03-19 MED ORDER — SODIUM CHLORIDE 0.9 % IV BOLUS (SEPSIS)
1000.0000 mL | Freq: Once | INTRAVENOUS | Status: AC
  Administered 2015-03-19: 1000 mL via INTRAVENOUS

## 2015-03-19 MED ORDER — PROCHLORPERAZINE EDISYLATE 5 MG/ML IJ SOLN
10.0000 mg | Freq: Once | INTRAMUSCULAR | Status: AC
  Administered 2015-03-19: 10 mg via INTRAVENOUS
  Filled 2015-03-19: qty 2

## 2015-03-19 NOTE — Discharge Instructions (Signed)
Migraine Headache °A migraine headache is an intense, throbbing pain on one or both sides of your head. A migraine can last for 30 minutes to several hours. °CAUSES  °The exact cause of a migraine headache is not always known. However, a migraine may be caused when nerves in the brain become irritated and release chemicals that cause inflammation. This causes pain. °Certain things may also trigger migraines, such as: °· Alcohol. °· Smoking. °· Stress. °· Menstruation. °· Aged cheeses. °· Foods or drinks that contain nitrates, glutamate, aspartame, or tyramine. °· Lack of sleep. °· Chocolate. °· Caffeine. °· Hunger. °· Physical exertion. °· Fatigue. °· Medicines used to treat chest pain (nitroglycerine), birth control pills, estrogen, and some blood pressure medicines. °SIGNS AND SYMPTOMS °· Pain on one or both sides of your head. °· Pulsating or throbbing pain. °· Severe pain that prevents daily activities. °· Pain that is aggravated by any physical activity. °· Nausea, vomiting, or both. °· Dizziness. °· Pain with exposure to bright lights, loud noises, or activity. °· General sensitivity to bright lights, loud noises, or smells. °Before you get a migraine, you may get warning signs that a migraine is coming (aura). An aura may include: °· Seeing flashing lights. °· Seeing bright spots, halos, or zigzag lines. °· Having tunnel vision or blurred vision. °· Having feelings of numbness or tingling. °· Having trouble talking. °· Having muscle weakness. °DIAGNOSIS  °A migraine headache is often diagnosed based on: °· Symptoms. °· Physical exam. °· A CT scan or MRI of your head. These imaging tests cannot diagnose migraines, but they can help rule out other causes of headaches. °TREATMENT °Medicines may be given for pain and nausea. Medicines can also be given to help prevent recurrent migraines.  °HOME CARE INSTRUCTIONS °· Only take over-the-counter or prescription medicines for pain or discomfort as directed by your  health care provider. The use of long-term narcotics is not recommended. °· Lie down in a dark, quiet room when you have a migraine. °· Keep a journal to find out what may trigger your migraine headaches. For example, write down: °¨ What you eat and drink. °¨ How much sleep you get. °¨ Any change to your diet or medicines. °· Limit alcohol consumption. °· Quit smoking if you smoke. °· Get 7-9 hours of sleep, or as recommended by your health care provider. °· Limit stress. °· Keep lights dim if bright lights bother you and make your migraines worse. °SEEK IMMEDIATE MEDICAL CARE IF:  °· Your migraine becomes severe. °· You have a fever. °· You have a stiff neck. °· You have vision loss. °· You have muscular weakness or loss of muscle control. °· You start losing your balance or have trouble walking. °· You feel faint or pass out. °· You have severe symptoms that are different from your first symptoms. °MAKE SURE YOU:  °· Understand these instructions. °· Will watch your condition. °· Will get help right away if you are not doing well or get worse. °Document Released: 10/04/2005 Document Revised: 02/18/2014 Document Reviewed: 06/11/2013 °ExitCare® Patient Information ©2015 [employer]. This information is not intended to replace advice given to you by your health care provider. Make sure you discuss any questions you have with your health care provider. ° ° °Emergency Department Resource Guide °1) Find a Doctor and Pay Out of Pocket °Although you won't have to find out who is covered by your insurance plan, it is a good idea to ask around and get recommendations.   You will then need to call the office and see if the doctor you have chosen will accept you as a new patient and what types of options they offer for patients who are self-pay. Some doctors offer discounts or will set up payment plans for their patients who do not have insurance, but you will need to ask so you aren't surprised when you get to your  appointment. ° °2) Contact Your Local Health Department °Not all health departments have doctors that can see patients for sick visits, but many do, so it is worth a call to see if yours does. If you don't know where your local health department is, you can check in your phone book. The CDC also has a tool to help you locate your state's health department, and many state websites also have listings of all of their local health departments. ° °3) Find a Walk-in Clinic °If your illness is not likely to be very severe or complicated, you may want to try a walk in clinic. These are popping up all over the country in pharmacies, drugstores, and shopping centers. They're usually staffed by nurse practitioners or physician assistants that have been trained to treat common illnesses and complaints. They're usually fairly quick and inexpensive. However, if you have serious medical issues or chronic medical problems, these are probably not your best option. ° °No Primary Care Doctor: °- Call [facility] at  832-8000 - they can help you locate a primary care doctor that  accepts your insurance, provides certain services, etc. °- Physician Referral Service- 1-800-533-3463 ° °Chronic Pain Problems: °Organization         Address  Phone   Notes  ° [facility]  (336) 297-2271 Patients need to be referred by their primary care doctor.  ° °Medication Assistance: °Organization         Address  Phone   Notes  °[place] [facility] [address] °[place], [place] [address] (336) 641-8030 --Must be a resident of [place] °-- Must have NO insurance coverage whatsoever (no Medicaid/ Medicare, etc.) °-- The pt. MUST have a primary care doctor that directs their care regularly and follows them in the community °  °[facility]  (866) 331-1348   °[employer]  (888) 892-1162   ° °Agencies that provide inexpensive medical care: °Organization         Address  Phone   Notes  °[facility]  (336) 832-8035   °[facility]    (336) 832-7272   °[facility] [address] °[place], [place] [address] (336) 832-4777   °[facility] [address], °[place] (336) 271-4999   °[facility]    (336) 373-0678   °[facility]    (336) 272-1050   °[facility] ° [address], [place] Phone:  (336) 832-4444, Fax:  (336) 832-4440 Hours of Operation:  9 am - 6 pm, M-F.  Also accepts Medicaid/Medicare and self-pay.  °[facility] ° [address], [place] Phone: (336) 832-3150, Fax: (336) 832-3151. Hours of Operation:  8:30 am - 5:30 pm, M-F.  Also accepts Medicaid and self-pay.  °[facility] [place] [address], [place] Phone: (336) 878-6027   °[facility] [address], [place], [place] (336)723-1848, Ext. 123 Mondays & Thursdays: 7-9 AM.  First 15 patients are seen on a first come,   first serve basis. °  ° °Medicaid-accepting [place] Providers: ° °Organization         Address  Phone   Notes  °[facility] [address], [place] (336) 641-2100 Also accepts self-pay patients.  °[facility] [address], [place] ° (336) 856-9996   °[facility] [address], [place] (336) 288-8857   °[facility] [address] [place] [address], [place] (336) 299-7000   °Veita Bland [address], [place]  ° (336) 373-1557 Only accepts [place] Access Medicaid patients after they have their name applied to their card.  ° °Self-Pay (no insurance) in [place]: ° °Organization         Address  Phone   Notes  °Sickle Cell Patients, [facility] [address], [place] (336) 832-1970   °[facility] [address], [place] (336) 832-4400   °[facility]  [place] ° [address], [place] (336) 992-4800   °[facility]/Dr. Osei-Bonsu ° [address] [place] [address], [place] or [address], [place] (336) 841-8500 Phone number for both [place] and [place] locations is the same.  °[facility] [address], [place] (336) 299-0000   °[facility] [address] [place] [address], [place] or [address] (336) 852-7530 °(336) 878-2260   °[facility] [address], [place] (336) 350-1642, phone; (336) 294-5005, fax Sees patients 1st and 3rd Saturday of every month.  Must not qualify for public or private insurance (i.e. Medicaid, Medicare, [place] Health Choice, Veterans' Benefits) • Household income should be no more than 200% of the poverty level •The clinic cannot treat you if you are pregnant or think you are pregnant • Sexually transmitted diseases are not treated at the clinic.  ° ° °Dental Care: °Organization         Address  Phone  Notes  °[facility] [facility] [address], [place] (336) 641-6152 Accepts children up to age 21 who are enrolled in Medicaid or [place] Health Choice; pregnant women with a Medicaid card; and children who have applied for Medicaid or [place] Health Choice, but were declined, whose parents can pay a reduced fee at time of service.  °[facility] [place]  [address], [place] (336) 641-7733 Accepts children up to age 21 who are enrolled in Medicaid or [place] Health Choice; pregnant women with a Medicaid card; and children who have applied for Medicaid or [place] Health Choice, but were declined, whose parents can pay a reduced fee at time of service.  °[facility] ° [address], [place] (336) 641-4533 Patients are seen by appointment only. Walk-ins are not accepted. [facility] will see patients 18 years of age and  older. °Monday - Tuesday (8am-5pm) °Most Wednesdays (8:30-5pm) °$30 per visit, cash only  °[facility] ° [address], [place] (336) 641-4533 Patients are seen by appointment only. Walk-ins are not accepted. [facility] will see patients 18 years of age and older. °One Wednesday Evening (Monthly: Volunteer Based).  $30 per visit, cash only  °[facility]  (919) 537-3737 for adults; Children under age 4, call Graduate Pediatric Dentistry at (919) [phone]. Children aged 4-14, please   call (919) [phone] to request a pediatric application. ° Dental services are provided in all areas of dental care including fillings, crowns and bridges, complete and partial dentures, implants, gum treatment, root canals, and extractions. Preventive care is also provided. Treatment is provided to both adults and children. °Patients are selected via a lottery and there is often a waiting list. °  °[facility] [address], °[place] ° (336) 763-8833 [email or web address] °  °[facility] [address], [place], [place] (336)723-1848, Ext. 123 Second and Fourth Thursday of each month, opens at 6:30 AM; Clinic ends at 9 AM.  Patients are seen on a first-come first-served basis, and a limited number are seen during each clinic.  ° °[facility] ° [address], [place], [place] (336) 723-7904   Eligibility Requirements °You must have lived in [place], [place], or [place] counties for at least the last three months. °  You cannot be eligible for state or federal sponsored healthcare insurance, including Veterans Administration, Medicaid, or Medicare. °  You generally cannot be eligible for healthcare insurance through your employer.  °  How to apply: °Eligibility screenings are held every Tuesday and Wednesday afternoon from 1:00 pm until 4:00 pm. You do not need an appointment for the interview!  °[facility] [address],  [place], [place] 336-631-2330   °[facility]  336-342-8273   °[place]  336-703-3100   °[facility]  336-570-6415   ° °Behavioral Health Resources in the Community: °Intensive Outpatient Programs °Organization         Address  Phone  Notes  °[facility] [address], [place], [place] 336-878-6098   °[facility] [address], [place], [place] 336-832-9800   °[facility] [address], [place], [place] ° 336-882-2125   °[facility] [address],  °[place], [place] 1-800-853-5163 or 336-641-4981   °Substance Abuse Resources °Organization         Address  Phone  Notes  °[facility]  336-882-2125   °[facility]  336-784-9470   °[facility]  336-285-9073   °[facility]  336-845-3988   °Residential & Outpatient Substance Abuse Program  1-800-659-3381   °Psychological Services °Organization         Address  Phone  Notes  °[facility]  336- 832-9600   °[facility]  336- 378-7881   °[facility] [address], [place] 1-800-853-5163 or 336-641-4981   ° °Mobile Crisis Teams °Organization         Address  Phone  Notes  °[facility], Mobile Crisis Care Unit  1-877-626-1772   °Assertive °[facility] ° [address] [place], [place] 336-834-9664   °Sharon DeEsch [address] °[place] 336-554-5454   ° °Self-Help/Support Groups °Organization         Address  Phone             Notes  °[facility] - variety of support groups  336- 373-1402 Call for more information  °Narcotics Anonymous (NA), [facility] [address], °[place]  2 meetings at this location  ° °Residential Treatment Programs °Organization         Address  Phone  Notes  °[facility] [address],    °[place]  1-866-801-8205   °[facility] ° [address], [place], [place] 704-293-8524   °[facility] [address], [place] 336-845-3988 Admissions: 8am-3pm M-F  °[facility] [address]    °[place], [place] 336-841-1104   °[facility] [address], [place], [place] 336-379-7146   °[facility] [address]  °[place], [place] 336-285-9073   °[facility] - Intensive Outpatient [address], [place], [place] 336-852-3033   °[facility] [address]  °[place], [place] 1-877-615-2722 or 336-784-9470   °[facility] [address] [place], [place] 336-227-7417 Accepts Medicaid  °[facility] [address]  °[place] 1-800-659-3381 Substance Abuse/Addiction Treatment  ° °[place] Behavioral Health Resources °Organization         Address  Phone  Notes  °[facility]  (888) 581-9988   °Julie Brannon, PhD [address] [place], [place]   (336) 349-5553 or (336) 951-0000   °[facility]   [address] °[place], [place] (336) 349-4454   °[facility] [address], [place], [place] (336) 342-8316 Insurance/Medicaid/sponsorship through [facility]  °[facility] [address]                                    [place], [place] (336) 342-8316 Therapy/tele-psych/case  °[facility] [address]  ° [place], [place] (336) 349-2233    °Dr. Arfeen  (336) 349-4544   °[facility] [place]  [employer] [place] [facility] 1) [address],  °2) [address], [place] °3)  [address], [place] (336) 349-3220 °(336) 342-7768 ° °(336) 342-8140   °[place] [facility] (336) 342-1394 or (336) 342-3537 (After Hours)    ° ° ° °

## 2015-03-19 NOTE — ED Notes (Signed)
Husband states pt is seen by [facility] and given resources. [facility] and was given a RX however [facility] has not been able to fill RX. Pt has been taken Inderal, Topamax and Celexa, Risperdal however none are working. Here for migraine that has been going on for 3 days however she states she has had a HA for 1 hear. Neuro intact

## 2015-03-19 NOTE — ED Notes (Signed)
Pt husband states the pt has been having a headache that won't go away over the last three days. Denies N/V/D/neck pain, husband states she has had some chills but has not check for fever. States the pain is mostly in her eyes.

## 2015-03-19 NOTE — ED Provider Notes (Signed)
CSN: [id number]     Arrival date & time 03/19/15  1710 History   First MD Initiated Contact with Patient 03/19/15 1757     Chief Complaint  Patient presents with  . Headache     (Consider location/radiation/quality/duration/timing/severity/associated sxs/prior Treatment) HPI Patient is a 24 year old female with past medical history of migraines who presents the ER complaining of headache. She reports since this morning she has noticed a gradual onset of a throbbing sensation in the right side of her head along with throbbing sensation behind her eyes. Patient reports associated photophobia, nausea, lightheadedness. Patient states she tried using her prescribed tramadol for her headaches which did not improve her symptoms. Patient states her headache today feels identical to and consistent with previous migraines she has experienced in the past. Patient denies any new symptoms, or vision, weakness, fever, neck pain, chest pain, shortness of breath, vomiting, abdominal pain, dysuria.  Past Medical History  Diagnosis Date  . Extended spectrum beta lactamase (ESBL) resistance   . Headache(784.0)   . Depression    Past Surgical History  Procedure Laterality Date  . No past surgeries     Family History  Problem Relation Age of Onset  . Alcohol abuse Neg Hx   . Arthritis Neg Hx   . Asthma Neg Hx   . Birth defects Neg Hx   . Cancer Neg Hx   . COPD Neg Hx   . Depression Neg Hx   . Diabetes Neg Hx   . Drug abuse Neg Hx   . Early death Neg Hx   . Hearing loss Neg Hx   . Heart disease Neg Hx   . Hyperlipidemia Neg Hx   . Hypertension Neg Hx   . Kidney disease Neg Hx   . Learning disabilities Neg Hx   . Mental illness Neg Hx   . Mental retardation Neg Hx   . Miscarriages / Stillbirths Neg Hx   . Stroke Neg Hx   . Vision loss Neg Hx   . Healthy Mother    History  Substance Use Topics  . Smoking status: Never Smoker   . Smokeless tobacco: Never Used  . Alcohol Use: No   OB  History    Gravida Para Term Preterm AB TAB SAB Ectopic Multiple Living   0 0 0 0 0 0 2     Review of Systems  Constitutional: Negative for fever.  HENT: Negative for trouble swallowing.   Eyes: Positive for photophobia. Negative for visual disturbance.  Respiratory: Negative for shortness of breath.   Cardiovascular: Negative for chest pain.  Gastrointestinal: Positive for vomiting. Negative for nausea and abdominal pain.  Genitourinary: Negative for dysuria.  Musculoskeletal: Negative for neck pain.  Skin: Negative for rash.  Neurological: Positive for headaches. Negative for dizziness, weakness and numbness.  Psychiatric/Behavioral: Negative.       Allergies  Other  Home Medications   Prior to Admission medications   Medication Sig Start Date End Date Taking? Authorizing Provider  citalopram (CELEXA) 20 MG tablet Take 1 tablet (20 mg total) by mouth daily. 12/16/14  Yes Adonis Brook, NP  ibuprofen (ADVIL,MOTRIN) 800 MG tablet Take 800 mg by mouth every 8 (eight) hours as needed for headache or moderate pain.    Yes Historical Provider, MD  propranolol (INDERAL) 10 MG tablet Take 1 tablet (10 mg total) by mouth 2 (two) times daily. 12/16/14  Yes Adonis Brook, NP  risperiDONE (RISPERDAL) 1 MG tablet Take 1 tablet (1  mg total) by mouth 2 (two) times daily. Take 1 tablet (1 mg) in the morning.  Take 2 tabs (2 mg) in the evening. 12/16/14  Yes Adonis BrookSheila Agustin, NP  topiramate (TOPAMAX) 25 MG tablet Take 25 mg by mouth 2 (two) times daily.   Yes Historical Provider, MD  traMADol (ULTRAM) 50 MG tablet Take 50 mg by mouth every 6 (six) hours as needed for moderate pain.    Yes Historical Provider, MD   BP 107/64 mmHg  Pulse 78  Temp(Src) 97.6 F (36.4 C) (Oral)  Resp 18  Ht 5\' 4"  (1.626 m)  Wt 149 lb (67.586 kg)  BMI 25.56 kg/m2  SpO2 100%  Breastfeeding? No Physical Exam  Constitutional: She is oriented to person, place, and time. She appears well-developed and  well-nourished. No distress.  HENT:  Head: Normocephalic and atraumatic.  Mouth/Throat: Oropharynx is clear and moist. No oropharyngeal exudate.  Eyes: Conjunctivae and EOM are normal. Pupils are equal, round, and reactive to light. Right eye exhibits no discharge. Left eye exhibits no discharge. No scleral icterus.  Neck: Normal range of motion and full passive range of motion without pain. Neck supple. No spinous process tenderness and no muscular tenderness present. No rigidity. No edema, no erythema and normal range of motion present. No Brudzinski's sign and no Kernig's sign noted.  Cardiovascular: Normal rate, regular rhythm, S1 normal, S2 normal and normal heart sounds.   No murmur heard. Pulmonary/Chest: Effort normal and breath sounds normal. No accessory muscle usage. No tachypnea. No respiratory distress.  Abdominal: Soft. There is no tenderness. There is no rigidity, no guarding, no tenderness at McBurney's point and negative Murphy's sign.  Musculoskeletal: Normal range of motion. She exhibits no edema or tenderness.  Neurological: She is alert and oriented to person, place, and time. She has normal strength. No cranial nerve deficit or sensory deficit. Coordination normal. GCS eye subscore is 4. GCS verbal subscore is 5. GCS motor subscore is 6.  Patient fully alert, answering questions appropriately in full, clear sentences. Cranial nerves II through XII grossly intact. Motor strength 5 out of 5 in all major muscle groups of upper and lower extremities. Distal sensation intact.   Skin: Skin is warm and dry. No rash noted. She is not diaphoretic.  Psychiatric: She has a normal mood and affect.    ED Course  Procedures (including critical care time) Labs Review Labs Reviewed  URINALYSIS, ROUTINE W REFLEX MICROSCOPIC (NOT AT [facility]) - Abnormal; Notable for the following:    Specific Gravity, Urine 1.037 (*)    Leukocytes, UA SMALL (*)    All other components within normal limits   URINE MICROSCOPIC-ADD ON - Abnormal; Notable for the following:    Squamous Epithelial / LPF FEW (*)    All other components within normal limits  PREGNANCY, URINE    Imaging Review No results found.   EKG Interpretation None      MDM   Final diagnoses:  Nonintractable migraine, unspecified migraine type    Pt HA treated and improved while in ED.  headache is consistent with an identical to previous migraines patient has experienced the past and non concerning for [facility], ICH, Meningitis, or temporal arteritis. Pt is afebrile with no focal neuro deficits, nuchal rigidity, or change in vision. Pt is to follow up with PCP to discuss prophylactic medication.  Discussed return precautions with patient, patient verbalizes understanding and agreement of this plan.  BP 107/64 mmHg  Pulse 78  Temp(Src) 97.6 F (  36.4 C) (Oral)  Resp 18  Ht  (1.626 m)  Wt 149 lb (67.586 kg)  BMI 25.56 kg/m2  SpO2 100%  Breastfeeding? No  Signed,  Ladona Mow, PA-C 12:02 AM      Ladona Mow, PA-C 03/20/15 0002  Tomasita Crumble, MD 03/20/15 2233

## 2015-06-16 ENCOUNTER — Ambulatory Visit: Payer: Medicaid Other | Admitting: Diagnostic Neuroimaging

## 2015-06-17 ENCOUNTER — Ambulatory Visit: Payer: Medicaid Other | Admitting: Diagnostic Neuroimaging

## 2015-06-18 ENCOUNTER — Encounter: Payer: Self-pay | Admitting: Diagnostic Neuroimaging

## 2015-06-26 ENCOUNTER — Ambulatory Visit (INDEPENDENT_AMBULATORY_CARE_PROVIDER_SITE_OTHER): Payer: Medicaid Other | Admitting: Diagnostic Neuroimaging

## 2015-06-26 ENCOUNTER — Encounter: Payer: Self-pay | Admitting: Diagnostic Neuroimaging

## 2015-06-26 VITALS — BP 95/67 | HR 79 | Ht 64.0 in | Wt 134.0 lb

## 2015-06-26 DIAGNOSIS — R519 Headache, unspecified: Secondary | ICD-10-CM

## 2015-06-26 DIAGNOSIS — R51 Headache: Secondary | ICD-10-CM | POA: Diagnosis not present

## 2015-06-26 DIAGNOSIS — G43111 Migraine with aura, intractable, with status migrainosus: Secondary | ICD-10-CM | POA: Diagnosis not present

## 2015-06-26 NOTE — Patient Instructions (Signed)
Consider starting one of the following for headache prevention: amitriptyline, nortriptyline, topiramate or gabapentin. Discuss with Dr. Omelia Blackwater.

## 2015-06-26 NOTE — Progress Notes (Signed)
[facility]  PATIENT: Catherine Gomez DOB: 12/28/1990  REFERRING CLINICIAN: Dr. Omelia Blackwater HISTORY FROM: patient, husband (who translates) REASON FOR VISIT: follow up   HISTORICAL  CHIEF COMPLAINT:  Chief Complaint  Patient presents with  . Migraine    rm 7, husband  . Follow-up    3 months, used interpreter phone line    HISTORY OF PRESENT ILLNESS:   UPDATE 06/26/15: Since last visit, headaches are stable. Now on new meds for depression, which are helping more. Patient and husband did not bring the medication list for current meds.   PRIOR HPI (03/12/15): 24 year old female here for evaluation of intractable headaches. Patient is originally from [place] and came to [place] after marriage. 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 She required behavioral health hospitalization, ECT treatments and multiple medications. Around this time she also developed significant headaches. She describes global severe throbbing headaches with sensitivity to light and sound, nausea, neck pain and dizziness. She never had these types of headaches before. She has almost daily headaches, with exacerbation several times per week. She has limited social support. She has tried propranolol 10 mg twice daily without relief. She is also on topiramate 25 mg twice a day.   REVIEW OF SYSTEMS: Full 14 system review of systems performed and notable only for fatigue fever loss of vision eye itching loss of vision eye pain heat intolerance urine decrease speech diff rectal pain back pain behavior problem freq waking.   ALLERGIES: Allergies  Allergen Reactions  . Other Hives and Itching    Depression med (name unknown)    HOME MEDICATIONS: Outpatient Prescriptions Prior to Visit  Medication Sig Dispense Refill  . citalopram (CELEXA) 20 MG tablet Take 1 tablet (20 mg total) by mouth  daily. 30 tablet 0  . ibuprofen (ADVIL,MOTRIN) 800 MG tablet Take 800 mg by mouth every 8 (eight) hours as needed for headache or moderate pain.     Marland Kitchen propranolol (INDERAL) 10 MG tablet Take 1 tablet (10 mg total) by mouth 2 (two) times daily. 60 tablet 0  . risperiDONE (RISPERDAL) 1 MG tablet Take 1 tablet (1 mg total) by mouth 2 (two) times daily. Take 1 tablet (1 mg) in the morning.  Take 2 tabs (2 mg) in the evening. 90 tablet 0  . topiramate (TOPAMAX) 25 MG tablet Take 25 mg by mouth 2 (two) times daily.    . traMADol (ULTRAM) 50 MG tablet Take 50 mg by mouth every 6 (six) hours as needed for moderate pain.      No facility-administered medications prior to visit.    PAST MEDICAL HISTORY: Past Medical History  Diagnosis Date  . Extended spectrum beta lactamase (ESBL) resistance   . Headache(784.0)   . Depression     PAST SURGICAL HISTORY: Past Surgical History  Procedure Laterality Date  . No past surgeries      FAMILY HISTORY: Family History  Problem Relation Age of Onset  . Alcohol abuse Neg Hx   . Arthritis Neg Hx   . Asthma Neg Hx   . Birth defects Neg Hx   . Cancer Neg Hx   . COPD Neg Hx   . Depression Neg Hx   . Diabetes Neg Hx   . Drug abuse Neg Hx   . Early death Neg Hx   . Hearing loss Neg Hx   . Heart disease Neg Hx   . Hyperlipidemia  Neg Hx   . Hypertension Neg Hx   . Kidney disease Neg Hx   . Learning disabilities Neg Hx   . Mental illness Neg Hx   . Mental retardation Neg Hx   . Miscarriages / Stillbirths Neg Hx   . Stroke Neg Hx   . Vision loss Neg Hx   . Healthy Mother     SOCIAL HISTORY:  Social History   Social History  . Marital Status: Married    Spouse Name: Ibounou Maiga  . Number of Children: 2  . Years of Education: 6th   Occupational History  . Not on file.   Social History Main Topics  . Smoking status: Never Smoker   . Smokeless tobacco: Never Used  . Alcohol Use: No  . Drug Use: No  . Sexual Activity: Yes   Other  Topics Concern  . Not on file   Social History Narrative   Lives at home with husband and two daughters    No caffeine      PHYSICAL EXAM  Filed Vitals:   06/26/15 1445  BP: 95/67  Pulse: 79  Height:  (1.626 m)  Weight: 134 lb (60.782 kg)    Body mass index is 22.99 kg/(m^2).  No exam data present  No flowsheet data found.  GENERAL EXAM: Patient is in no distress; well developed, nourished and groomed; neck is supple; SOFT SPOKEN  CARDIOVASCULAR: Regular rate and rhythm, no murmurs, no carotid bruits  NEUROLOGIC: MENTAL STATUS: awake, alert,  language fluent, comprehension intact, naming intact, fund of knowledge appropriate; PER TRANSLATION VIA HUSBAND CRANIAL NERVE: no papilledema on fundoscopic exam, pupils equal and reactive to light, visual fields full to confrontation, extraocular muscles intact, no nystagmus, facial sensation and strength symmetric, hearing intact, palate elevates symmetrically, uvula midline, shoulder shrug symmetric, tongue midline. MOTOR: normal bulk and tone, full strength in the BUE, BLE SENSORY: normal and symmetric to light touch  COORDINATION: finger-nose-finger, fine finger movements normal REFLEXES: deep tendon reflexes present and symmetric GAIT/STATION: narrow based gait; romberg is negative    DIAGNOSTIC DATA (LABS, IMAGING, TESTING) - I reviewed patient records, labs, notes, testing and imaging myself where available.  Lab Results  Component Value Date   WBC 7.9 12/03/2014   HGB 11.9* 12/03/2014   HCT 38.3 12/03/2014   MCV 83.8 12/03/2014   PLT 219 12/03/2014      Component Value Date/Time   NA 142 12/13/2014 0712   K 3.9 12/13/2014 0712   CL 113* 12/13/2014 0712   CO2 24 12/13/2014 0712   GLUCOSE 92 12/13/2014 0712   BUN 21 12/13/2014 0712   CREATININE 0.80 12/13/2014 0712   CALCIUM 8.9 12/13/2014 0712   PROT 8.7* 12/03/2014 1423   ALBUMIN 4.7 12/03/2014 1423   AST 17 12/03/2014 1423   ALT 15 12/03/2014 1423    ALKPHOS 83 12/03/2014 1423   BILITOT 0.8 12/03/2014 1423   GFRNONAA >90 12/13/2014 0712   GFRAA >90 12/13/2014 0712   Lab Results  Component Value Date   CHOL 159 12/13/2014   HDL 39* 12/13/2014   LDLCALC 112* 12/13/2014   TRIG 38 12/13/2014   CHOLHDL 4.1 12/13/2014   Lab Results  Component Value Date   HGBA1C 5.7* 12/13/2014   No results found for: VITAMINB12 Lab Results  Component Value Date   TSH 2.080 04/07/2014    12/11/14 MRI brain - Negative exam. No acute or focal intracranial abnormality. [I reviewed images myself and agree with interpretation. -VRP]  ASSESSMENT AND PLAN  24 y.o. year old female here with intractable headaches, in setting of significant post-partum depression. Depression is slowly improving. Headaches are stable.  Dx: Intractable migraine with aura with status migrainosus  Chronic daily headache    PLAN: I spent 15 minutes of face to face time with patient. Greater than 50% of time was spent in counseling and coordination of care with patient. In summary we discussed:  - continue ibuprofen  - consider starting one of the following: amitriptyline, nortriptyline, topiramate or gabapentin - headache education reviewed (nutrition, exercise, triggers, stress mgmt, sleep)  Return in about 6 months (around 12/24/2015).    Suanne Marker, MD 06/26/2015, 3:08 PM Certified in Neurology, Neurophysiology and Neuroimaging  [facility] [address], [address] [place], [place] [address] [phone] a

## 2015-12-25 ENCOUNTER — Ambulatory Visit (INDEPENDENT_AMBULATORY_CARE_PROVIDER_SITE_OTHER): Payer: Medicaid Other | Admitting: Diagnostic Neuroimaging

## 2015-12-25 ENCOUNTER — Encounter: Payer: Self-pay | Admitting: Diagnostic Neuroimaging

## 2015-12-25 VITALS — BP 109/77 | HR 90 | Ht 64.0 in | Wt 153.0 lb

## 2015-12-25 DIAGNOSIS — R519 Headache, unspecified: Secondary | ICD-10-CM

## 2015-12-25 DIAGNOSIS — R51 Headache: Secondary | ICD-10-CM

## 2015-12-25 NOTE — Patient Instructions (Signed)
Thank you for coming to see [place] at [facility]. I hope we have been able to provide you high quality care today.  You may receive a patient satisfaction survey over the next few weeks. We would appreciate your feedback and comments so that we may continue to improve ourselves and the health of our patients.  - continue ibuprofen as needed    ~~~~~~~~~~~~~~~~~~~~~~~~~~~~~~~~~~~~~~~~~~~~~~~~~~~~~~~~~~~~~~~~~  DR. Brindy Higginbotham'S GUIDE TO HAPPY AND HEALTHY LIVING These are some of my general health and wellness recommendations. Some of them may apply to you better than others. Please use common sense as you try these suggestions and feel free to ask me any questions.   ACTIVITY/FITNESS Mental, social, emotional and physical stimulation are very important for brain and body health. Try learning a new activity (arts, music, language, sports, games).  Keep moving your body to the best of your abilities. You can do this at home, inside or outside, the park, community center, gym or anywhere you like. Consider a physical therapist or personal trainer to get started. Consider the app Sworkit. Fitness trackers such as smart-watches, smart-phones or Fitbits can help as well.   NUTRITION Eat more plants: colorful vegetables, nuts, seeds and berries.  Eat less sugar, salt, preservatives and processed foods.  Avoid toxins such as cigarettes and alcohol.  Drink water when you are thirsty. Warm water with a slice of lemon is an excellent morning drink to start the day.  Consider these websites for more information The Nutrition Source (https://www.henry-hernandez.biz/) Precision Nutrition (WindowBlog.ch)   RELAXATION Consider practicing mindfulness meditation or other relaxation techniques such as deep breathing, prayer, yoga, tai chi, massage. See website mindful.org or the apps Headspace or Calm to help get started.   SLEEP Try to get  at least 7-8+ hours sleep per day. Regular exercise and reduced caffeine will help you sleep better. Practice good sleep hygeine techniques. See website sleep.org for more information.   PLANNING Prepare estate planning, living will, healthcare POA documents. Sometimes this is best planned with the help of an attorney. Theconversationproject.org and agingwithdignity.org are excellent resources.

## 2015-12-25 NOTE — Progress Notes (Signed)
[facility]  PATIENT: Catherine Gomez DOB: 03/25/1991  REFERRING CLINICIAN: Dr. Omelia BlackwaterHeaden HISTORY FROM: patient, husband (interpreter also here who translates) REASON FOR VISIT: follow up   HISTORICAL  CHIEF COMPLAINT:  Chief Complaint  Patient presents with  . Migraine    rm 6,  husband -Ibounou, interpreter - Cristal DeerMarie Soh, "only taking Ibuprofen for HA w/good relief; other meds caused her to feel uncomfortable"  . Follow-up    6 month    HISTORY OF PRESENT ILLNESS:   UPDATE 12/25/15: Since last visit, HA stable. Takes ibuprofen 800mg  daily for HA.   UPDATE 06/26/15: Since last visit, headaches are stable. Now on new meds for depression, which are helping more. Patient and husband did not bring the medication list for current meds.   PRIOR HPI (03/12/15): 25 year old female here for evaluation of intractable headaches. Patient [place] and came to [place] after marriage. 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 She required behavioral health hospitalization, ECT treatments and multiple medications. Around this time she also developed significant headaches. She describes global severe throbbing headaches with sensitivity to light and sound, nausea, neck pain and dizziness. She never had these types of headaches before. She has almost daily headaches, with exacerbation several times per week. She has limited social support. She has tried propranolol 10 mg twice daily without relief. She is also on topiramate 25 mg twice a day.   REVIEW OF SYSTEMS: Full 14 system review of systems performed and negative except for dizziness depression speech diff fatigue cough eye itching.     ALLERGIES: Allergies  Allergen Reactions  . Other Hives and Itching    Depression med (name unknown)    HOME MEDICATIONS: Outpatient Prescriptions Prior to Visit    Medication Sig Dispense Refill  . ibuprofen (ADVIL,MOTRIN) 800 MG tablet Take 800 mg by mouth every 8 (eight) hours as needed for headache or moderate pain.     . citalopram (CELEXA) 20 MG tablet Take 1 tablet (20 mg total) by mouth daily. (Patient not taking: Reported on 12/25/2015) 30 tablet 0  . propranolol (INDERAL) 10 MG tablet Take 1 tablet (10 mg total) by mouth 2 (two) times daily. (Patient not taking: Reported on 12/25/2015) 60 tablet 0  . risperiDONE (RISPERDAL) 1 MG tablet Take 1 tablet (1 mg total) by mouth 2 (two) times daily. Take 1 tablet (1 mg) in the morning.  Take 2 tabs (2 mg) in the evening. (Patient not taking: Reported on 12/25/2015) 90 tablet 0  . topiramate (TOPAMAX) 25 MG tablet Take 25 mg by mouth 2 (two) times daily. Reported on 12/25/2015    . traMADol (ULTRAM) 50 MG tablet Take 50 mg by mouth every 6 (six) hours as needed for moderate pain. Reported on 12/25/2015     No facility-administered medications prior to visit.    PAST MEDICAL HISTORY: Past Medical History  Diagnosis Date  . Extended spectrum beta lactamase (ESBL) resistance   . Headache(784.0)   . Depression     PAST SURGICAL HISTORY: Past Surgical History  Procedure Laterality Date  . No past surgeries      FAMILY HISTORY: Family History  Problem Relation Age of Onset  . Alcohol abuse Neg Hx   . Arthritis Neg Hx   . Asthma Neg Hx   . Birth defects Neg Hx   . Cancer Neg Hx   . COPD Neg Hx   .  Depression Neg Hx   . Diabetes Neg Hx   . Drug abuse Neg Hx   . Early death Neg Hx   . Hearing loss Neg Hx   . Heart disease Neg Hx   . Hyperlipidemia Neg Hx   . Hypertension Neg Hx   . Kidney disease Neg Hx   . Learning disabilities Neg Hx   . Mental illness Neg Hx   . Mental retardation Neg Hx   . Miscarriages / Stillbirths Neg Hx   . Stroke Neg Hx   . Vision loss Neg Hx   . Healthy Mother     SOCIAL HISTORY:  Social History   Social History  . Marital Status: Married    Spouse Name:  Ibounou Maiga  . Number of Children: 2  . Years of Education: 6th   Occupational History  . Not on file.   Social History Main Topics  . Smoking status: Never Smoker   . Smokeless tobacco: Never Used  . Alcohol Use: No  . Drug Use: No  . Sexual Activity: Yes   Other Topics Concern  . Not on file   Social History Narrative   Lives at home with husband and two daughters    No caffeine      PHYSICAL EXAM  Filed Vitals:   12/25/15 1433  BP: 109/77  Pulse: 90  Height:  (1.626 m)  Weight: 153 lb (69.4 kg)    Body mass index is 26.25 kg/(m^2).  No exam data present  No flowsheet data found.  GENERAL EXAM: Patient is in no distress; well developed, nourished and groomed; neck is supple  CARDIOVASCULAR: Regular rate and rhythm, no murmurs, no carotid bruits  NEUROLOGIC: MENTAL STATUS: awake, alert,  language fluent, comprehension intact, naming intact, fund of knowledge appropriate CRANIAL NERVE: no papilledema on fundoscopic exam, pupils equal and reactive to light, visual fields full to confrontation, extraocular muscles intact, no nystagmus, facial sensation and strength symmetric, hearing intact, palate elevates symmetrically, uvula midline, shoulder shrug symmetric, tongue midline. MOTOR: normal bulk and tone, full strength in the BUE, BLE SENSORY: normal and symmetric to light touch  COORDINATION: finger-nose-finger, fine finger movements normal REFLEXES: deep tendon reflexes present and symmetric GAIT/STATION: narrow based gait; romberg is negative    DIAGNOSTIC DATA (LABS, IMAGING, TESTING) - I reviewed patient records, labs, notes, testing and imaging myself where available.  Lab Results  Component Value Date   WBC 7.9 12/03/2014   HGB 11.9* 12/03/2014   HCT 38.3 12/03/2014   MCV 83.8 12/03/2014   PLT 219 12/03/2014      Component Value Date/Time   NA 142 12/13/2014 0712   K 3.9 12/13/2014 0712   CL 113* 12/13/2014 0712   CO2 24 12/13/2014  0712   GLUCOSE 92 12/13/2014 0712   BUN 21 12/13/2014 0712   CREATININE 0.80 12/13/2014 0712   CALCIUM 8.9 12/13/2014 0712   PROT 8.7* 12/03/2014 1423   ALBUMIN 4.7 12/03/2014 1423   AST 17 12/03/2014 1423   ALT 15 12/03/2014 1423   ALKPHOS 83 12/03/2014 1423   BILITOT 0.8 12/03/2014 1423   GFRNONAA >90 12/13/2014 0712   GFRAA >90 12/13/2014 0712   Lab Results  Component Value Date   CHOL 159 12/13/2014   HDL 39* 12/13/2014   LDLCALC 112* 12/13/2014   TRIG 38 12/13/2014   CHOLHDL 4.1 12/13/2014   Lab Results  Component Value Date   HGBA1C 5.7* 12/13/2014   No results found for: [id number] Lab  Results  Component Value Date   TSH 2.080 04/07/2014    12/11/14 MRI brain - Negative exam. No acute or focal intracranial abnormality. [I reviewed images myself and agree with interpretation. -VRP]     ASSESSMENT AND PLAN  25 y.o. year old female here with intractable headaches, in setting of significant post-partum depression. Depression is slowly improving. Headaches are stable.  Dx:  Chronic daily headache    PLAN: - continue ibuprofen  - headache education reviewed (nutrition, exercise, triggers, stress mgmt, sleep)  Return for return to PCP.    Suanne Marker, MD 12/25/2015, 2:58 PM Certified in Neurology, Neurophysiology and Neuroimaging  [facility] [address] [place], [place] [address] [phone] a

## 2015-12-26 ENCOUNTER — Encounter (HOSPITAL_COMMUNITY): Payer: Self-pay | Admitting: Emergency Medicine

## 2015-12-26 ENCOUNTER — Emergency Department (HOSPITAL_COMMUNITY)
Admission: EM | Admit: 2015-12-26 | Discharge: 2015-12-26 | Disposition: A | Discharge status: Home / Self Care | Payer: Medicaid Other | Attending: Emergency Medicine | Admitting: Emergency Medicine

## 2015-12-26 DIAGNOSIS — Z3202 Encounter for pregnancy test, result negative: Secondary | ICD-10-CM | POA: Insufficient documentation

## 2015-12-26 DIAGNOSIS — Z8659 Personal history of other mental and behavioral disorders: Secondary | ICD-10-CM | POA: Insufficient documentation

## 2015-12-26 DIAGNOSIS — R1084 Generalized abdominal pain: Secondary | ICD-10-CM | POA: Insufficient documentation

## 2015-12-26 DIAGNOSIS — R1013 Epigastric pain: Secondary | ICD-10-CM | POA: Diagnosis present

## 2015-12-26 LAB — URINALYSIS, ROUTINE W REFLEX MICROSCOPIC
Bilirubin Urine: NEGATIVE
Glucose, UA: NEGATIVE mg/dL
KETONES UR: NEGATIVE mg/dL
Nitrite: NEGATIVE
PROTEIN: NEGATIVE mg/dL
Specific Gravity, Urine: 1.037 — ABNORMAL HIGH (ref 1.005–1.030)
pH: 5 (ref 5.0–8.0)

## 2015-12-26 LAB — WET PREP, GENITAL
CLUE CELLS WET PREP: NONE SEEN
Sperm: NONE SEEN
TRICH WET PREP: NONE SEEN
YEAST WET PREP: NONE SEEN

## 2015-12-26 LAB — COMPREHENSIVE METABOLIC PANEL
ALT: 26 U/L (ref 14–54)
AST: 24 U/L (ref 15–41)
Albumin: 4.8 g/dL (ref 3.5–5.0)
Alkaline Phosphatase: 79 U/L (ref 38–126)
Anion gap: 9 (ref 5–15)
BILIRUBIN TOTAL: 0.5 mg/dL (ref 0.3–1.2)
BUN: 11 mg/dL (ref 6–20)
CHLORIDE: 107 mmol/L (ref 101–111)
CO2: 23 mmol/L (ref 22–32)
Calcium: 9.2 mg/dL (ref 8.9–10.3)
Creatinine, Ser: 0.63 mg/dL (ref 0.44–1.00)
Glucose, Bld: 77 mg/dL (ref 65–99)
Potassium: 3.8 mmol/L (ref 3.5–5.1)
Sodium: 139 mmol/L (ref 135–145)
Total Protein: 9.1 g/dL — ABNORMAL HIGH (ref 6.5–8.1)

## 2015-12-26 LAB — I-STAT BETA HCG BLOOD, ED (MC, WL, AP ONLY): I-stat hCG, quantitative: <5 m[IU]/mL (ref <5)

## 2015-12-26 LAB — CBC
HCT: 39.5 % (ref 36.0–46.0)
Hemoglobin: 12.2 g/dL (ref 12.0–15.0)
MCH: 26 pg (ref 26.0–34.0)
MCHC: 30.9 g/dL (ref 30.0–36.0)
MCV: 84 fL (ref 78.0–100.0)
Platelets: 175 10*3/uL (ref 150–400)
RBC: 4.7 MIL/uL (ref 3.87–5.11)
RDW: 14.7 % (ref 11.5–15.5)
WBC: 8.1 10*3/uL (ref 4.0–10.5)

## 2015-12-26 LAB — URINE MICROSCOPIC-ADD ON: RBC / HPF: NONE SEEN RBC/hpf (ref 0–5)

## 2015-12-26 LAB — LIPASE, BLOOD: LIPASE: 28 U/L (ref 11–51)

## 2015-12-26 NOTE — Discharge Instructions (Signed)
We did not find serious cause of your abdominal pain today. Avoid taking ibuprofen, and take tylenol instead as frequent ibuprofen use may cause abdominal pain.  Return for worsening symptoms, including fever, vomiting and unable to keep down food/fluids, worsening pain, or any other symptoms concerning to you.  Abdominal Pain, Adult Many things can cause abdominal pain. Usually, abdominal pain is not caused by a disease and will improve without treatment. It can often be observed and treated at home. Your health care provider will do a physical exam and possibly order blood tests and X-rays to help determine the seriousness of your pain. However, in many cases, more time must pass before a clear cause of the pain can be found. Before that point, your health care provider may not know if you need more testing or further treatment. HOME CARE INSTRUCTIONS Monitor your abdominal pain for any changes. The following actions may help to alleviate any discomfort you are experiencing:  Only take over-the-counter or prescription medicines as directed by your health care provider.  Do not take laxatives unless directed to do so by your health care provider.  Try a clear liquid diet (broth, tea, or water) as directed by your health care provider. Slowly move to a bland diet as tolerated. SEEK MEDICAL CARE IF:  You have unexplained abdominal pain.  You have abdominal pain associated with nausea or diarrhea.  You have pain when you urinate or have a bowel movement.  You experience abdominal pain that wakes you in the night.  You have abdominal pain that is worsened or improved by eating food.  You have abdominal pain that is worsened with eating fatty foods.  You have a fever. SEEK IMMEDIATE MEDICAL CARE IF:  Your pain does not go away within 2 hours.  You keep throwing up (vomiting).  Your pain is felt only in portions of the abdomen, such as the right side or the left lower portion of the  abdomen.  You pass bloody or black tarry stools. MAKE SURE YOU:  Understand these instructions.  Will watch your condition.  Will get help right away if you are not doing well or get worse.   This information is not intended to replace advice given to you by your health care provider. Make sure you discuss any questions you have with your health care provider.   Document Released: 07/14/2005 Document Revised: 06/25/2015 Document Reviewed: 06/13/2013 Elsevier Interactive Patient Education [employer]2016 [employer].

## 2015-12-26 NOTE — ED Provider Notes (Signed)
CSN: [id number]     Arrival date & time 12/26/15  1129 History   First MD Initiated Contact with Patient 12/26/15 1626     Chief Complaint  Patient presents with  . wants birth control implant removed    . Abdominal Pain     (Consider location/radiation/quality/duration/timing/severity/associated sxs/prior Treatment) HPI 25 year old female who presents with intermittent low abdominal pain. States that she has a birth control implant in her right arm placed in 2015. States that over the past 6 months she has had intermittent low abdominal cramping, which she has been taking ibuprofen for. More recently she also has been having some intermittent epigastric pains. Has had vaginal spotting, which she reports is baseline for her birth control implant. No abnormal vaginal discharge, fevers or chills, dysuria or urinary frequency, flank pain, nausea vomiting or diarrhea. States that she has been trying to get her birth control implant out of her left right arm, but was sent to the ED instead [facility].  Past Medical History  Diagnosis Date  . Extended spectrum beta lactamase (ESBL) resistance   . Headache(784.0)   . Depression    Past Surgical History  Procedure Laterality Date  . No past surgeries     Family History  Problem Relation Age of Onset  . Alcohol abuse Neg Hx   . Arthritis Neg Hx   . Asthma Neg Hx   . Birth defects Neg Hx   . Cancer Neg Hx   . COPD Neg Hx   . Depression Neg Hx   . Diabetes Neg Hx   . Drug abuse Neg Hx   . Early death Neg Hx   . Hearing loss Neg Hx   . Heart disease Neg Hx   . Hyperlipidemia Neg Hx   . Hypertension Neg Hx   . Kidney disease Neg Hx   . Learning disabilities Neg Hx   . Mental illness Neg Hx   . Mental retardation Neg Hx   . Miscarriages / Stillbirths Neg Hx   . Stroke Neg Hx   . Vision loss Neg Hx   . Healthy Mother    Social History  Substance Use Topics  . Smoking status: Never Smoker   . Smokeless  tobacco: Never Used  . Alcohol Use: No   OB History    Gravida Para Term Preterm AB TAB SAB Ectopic Multiple Living   2 2 2  0 0 0 0 0 0 2     Review of Systems  10/14 systems reviewed and are negative other than those stated in the HPI   Allergies  Other  Home Medications   Prior to Admission medications   Medication Sig Start Date End Date Taking? Authorizing Provider  ibuprofen (ADVIL,MOTRIN) 800 MG tablet Take 800 mg by mouth every 8 (eight) hours as needed for headache or moderate pain.    Yes Historical Provider, MD  citalopram (CELEXA) 20 MG tablet Take 1 tablet (20 mg total) by mouth daily. Patient not taking: Reported on 12/25/2015 12/16/14   Adonis BrookSheila Agustin, NP  propranolol (INDERAL) 10 MG tablet Take 1 tablet (10 mg total) by mouth 2 (two) times daily. Patient not taking: Reported on 12/26/2015 12/16/14   Adonis BrookSheila Agustin, NP  risperiDONE (RISPERDAL) 1 MG tablet Take 1 tablet (1 mg total) by mouth 2 (two) times daily. Take 1 tablet (1 mg) in the morning.  Take 2 tabs (2 mg) in the evening. Patient not taking: Reported on 12/25/2015 12/16/14  Adonis Brook, NP   BP 109/74 mmHg  Pulse 84  Temp(Src) 97.8 F (36.6 C) (Oral)  Resp 16  SpO2 100% Physical Exam  Physical Exam  Nursing note and vitals reviewed. Constitutional: Well developed, well nourished, non-toxic, and in no acute distress Head: Normocephalic and atraumatic.  Mouth/Throat: Oropharynx is clear and moist.  Neck: Normal range of motion. Neck supple.  Cardiovascular: Normal rate and regular rhythm.   Pulmonary/Chest: Effort normal and breath sounds normal.  Abdominal: Soft. There is minimal epigastric tenderness. There is no rebound and no guarding.  Musculoskeletal: Normal range of motion.  Neurological: Alert, no facial droop, fluent speech, moves all extremities symmetrically Skin: Skin is warm and dry.  Psychiatric: Cooperative Pelvic: Normal external genitalia. Normal internal genitalia. No discharge. No  blood within the vagina. No cervical motion tenderness. No adnexal masses or tenderness.   ED Course  Procedures (including critical care time) Labs Review Labs Reviewed  WET PREP, GENITAL - Abnormal; Notable for the following:    WBC, Wet Prep HPF POC MANY (*)    All other components within normal limits  COMPREHENSIVE METABOLIC PANEL - Abnormal; Notable for the following:    Total Protein 9.1 (*)    All other components within normal limits  URINALYSIS, ROUTINE W REFLEX MICROSCOPIC (NOT AT [facility]) - Abnormal; Notable for the following:    APPearance CLOUDY (*)    Specific Gravity, Urine 1.037 (*)    Hgb urine dipstick TRACE (*)    Leukocytes, UA SMALL (*)    All other components within normal limits  URINE MICROSCOPIC-ADD ON - Abnormal; Notable for the following:    Squamous Epithelial / LPF 0-5 (*)    Bacteria, UA RARE (*)    All other components within normal limits  LIPASE, BLOOD  CBC  I-STAT BETA HCG BLOOD, ED ([facility], [facility], [facility] ONLY)  GC/CHLAMYDIA PROBE AMP ([place]) NOT AT [facility]    Imaging Review No results found. I have personally reviewed and evaluated these images and lab results as part of my medical decision-making.   EKG Interpretation None      MDM   Final diagnoses:  Generalized abdominal pain    25 year old female who presents with intermittent abdominal cramping over the past 6 months. Presentation is well-appearing and in no acute distress. With stable vital signs. Has a soft and benign abdomen. Minimal epigastric tenderness at this time. She has an unremarkable CBC, comprehensive metabolic panel, lipase, urinalysis. She is not pregnant. Pelvic exam is normal. Discussed with patient that we'll not be able to take out her birth control implant in the emergency department here. She is referred to gynecology for implant removal as an outpatient. At this time and it does not appear to be serious intra-abdominal process, and I do not feel that she requires acute  imaging. She has been taking daily ibuprofen in the setting of her pain, and her epigastric pain may be due to GI irritation or gastritis. Discussed discontinuing shin of this medication in the interim to see if symptoms would potentially improve. She is given outpatient follow-up and strict return instructions are reviewed. Strict return and follow-up instructions reviewed. She expressed understanding of all discharge instructions and felt comfortable with the plan of care.   Lavera Guise, MD 12/26/15 1919

## 2015-12-26 NOTE — ED Notes (Addendum)
Pt wants birth control implant on R arm removed due to vaginal bleeding. Pt also reports she has been having lower abd pain not associated with vaginal bleeding yesterday. Had emesis yesterday as well.

## 2015-12-26 NOTE — Progress Notes (Signed)
Entered in d/ c instructions  [place] [facility] [place] in [place] access's response history as your pcp/family doctor Schedule an appointment as soon as possible for a visit If [place] [facility] is your pcp call [phone] to make appt This is your assigned Medicaid [place] access doctor If you prefer another contact DSS [phone] DSS assigned your doctor *You may receive a bill if you go to any family Dr Marin Olpnotassign To correct/updated this go to or call [place]: [phone] [address] [place], [place] [id number] [email or web address] Use this website to assist with understanding your coverage & to renew application medicaid [place] access coverage As a Medicaid client you MUST contact DSS/SSI each time you change address, move to another [place] or another state to keep your address updated  [place] Transportation to Dr appts if you are have full Medicaid: [id number], [phone]

## 2015-12-29 LAB — GC/CHLAMYDIA PROBE AMP (~~LOC~~) NOT AT ARMC
Chlamydia: NEGATIVE
NEISSERIA GONORRHEA: NEGATIVE

## 2016-07-10 ENCOUNTER — Encounter: Payer: Self-pay | Admitting: *Deleted
